# Patient Record
Sex: Male | Born: 1960
Health system: Southern US, Community
[De-identification: ages and names within clinical notes are randomized; demographics above are authoritative.]

## PROBLEM LIST (undated history)

## (undated) DIAGNOSIS — M109 Gout, unspecified: Secondary | ICD-10-CM

## (undated) DIAGNOSIS — I10 Essential (primary) hypertension: Secondary | ICD-10-CM

## (undated) DIAGNOSIS — Z86010 Personal history of colonic polyps: Secondary | ICD-10-CM

## (undated) DIAGNOSIS — E785 Hyperlipidemia, unspecified: Secondary | ICD-10-CM

## (undated) DIAGNOSIS — T7840XA Allergy, unspecified, initial encounter: Secondary | ICD-10-CM

## (undated) DIAGNOSIS — M1990 Unspecified osteoarthritis, unspecified site: Secondary | ICD-10-CM

## (undated) HISTORY — PX: POLYPECTOMY: SHX149

## (undated) HISTORY — DX: Unspecified osteoarthritis, unspecified site: M19.90

## (undated) HISTORY — DX: Allergy, unspecified, initial encounter: T78.40XA

## (undated) HISTORY — PX: CARPAL TUNNEL RELEASE: SHX101

## (undated) HISTORY — DX: Essential (primary) hypertension: I10

## (undated) HISTORY — DX: Gout, unspecified: M10.9

## (undated) HISTORY — DX: Personal history of colonic polyps: Z86.010

## (undated) HISTORY — DX: Hyperlipidemia, unspecified: E78.5

---
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
## 1986-06-16 HISTORY — PX: APPENDECTOMY: SHX54

## 1994-06-16 HISTORY — PX: EXPLORATORY LAPAROTOMY: SUR591

## 1995-06-17 HISTORY — PX: INCISIONAL HERNIA REPAIR: SHX193

## 1998-11-26 ENCOUNTER — Ambulatory Visit (HOSPITAL_COMMUNITY): Admission: RE | Admit: 1998-11-26 | Discharge: 1998-11-26 | Payer: Self-pay | Admitting: *Deleted

## 1998-11-26 ENCOUNTER — Encounter: Payer: Self-pay | Admitting: Family Medicine

## 2001-06-28 ENCOUNTER — Emergency Department (HOSPITAL_COMMUNITY): Admission: EM | Admit: 2001-06-28 | Discharge: 2001-06-28 | Payer: Self-pay | Admitting: Emergency Medicine

## 2001-06-28 ENCOUNTER — Encounter: Payer: Self-pay | Admitting: *Deleted

## 2001-09-09 ENCOUNTER — Ambulatory Visit (HOSPITAL_COMMUNITY): Admission: RE | Admit: 2001-09-09 | Discharge: 2001-09-09 | Payer: Self-pay | Admitting: Orthopaedic Surgery

## 2003-04-12 ENCOUNTER — Observation Stay (HOSPITAL_COMMUNITY): Admission: EM | Admit: 2003-04-12 | Discharge: 2003-04-13 | Payer: Self-pay | Admitting: Emergency Medicine

## 2003-04-18 ENCOUNTER — Ambulatory Visit (HOSPITAL_COMMUNITY): Admission: RE | Admit: 2003-04-18 | Discharge: 2003-04-18 | Payer: Self-pay | Admitting: Cardiology

## 2004-02-12 ENCOUNTER — Encounter: Admission: RE | Admit: 2004-02-12 | Discharge: 2004-03-22 | Payer: Self-pay | Admitting: Orthopaedic Surgery

## 2004-05-01 ENCOUNTER — Ambulatory Visit (HOSPITAL_COMMUNITY): Admission: RE | Admit: 2004-05-01 | Discharge: 2004-05-01 | Payer: Self-pay | Admitting: Orthopaedic Surgery

## 2004-05-22 ENCOUNTER — Encounter: Admission: RE | Admit: 2004-05-22 | Discharge: 2004-08-20 | Payer: Self-pay | Admitting: Orthopaedic Surgery

## 2004-06-16 HISTORY — PX: ELBOW SURGERY: SHX618

## 2008-02-10 ENCOUNTER — Ambulatory Visit (HOSPITAL_COMMUNITY): Admission: RE | Admit: 2008-02-10 | Discharge: 2008-02-10 | Payer: Self-pay | Admitting: Orthopaedic Surgery

## 2008-02-15 ENCOUNTER — Encounter: Admission: RE | Admit: 2008-02-15 | Discharge: 2008-03-28 | Payer: Self-pay | Admitting: Orthopaedic Surgery

## 2009-03-27 ENCOUNTER — Encounter: Admission: RE | Admit: 2009-03-27 | Discharge: 2009-06-14 | Payer: Self-pay | Admitting: Orthopaedic Surgery

## 2010-11-01 NOTE — H&P (Signed)
NAME:  Andre Hunt, Andre Hunt               ACCOUNT NO.:  [id number]   MEDICAL RECORD NO.:  [id number]          PATIENT TYPE:  AMB   LOCATION:  DAY                           FACILITY:  [facility]   PHYSICIAN:  J. Darreld Mclean, M.D. DATE OF BIRTH:  1960/11/17   DATE OF ADMISSION:  DATE OF DISCHARGE:  LH                                HISTORY & PHYSICAL   CHIEF COMPLAINT:  Elbow pain, carpal tunnel.   The patient is a 50 year old male with pain and tenderness in his right  elbow. 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 He hit his elbow on the right on an EMS call removing an  oxygen bottle on first response call. He was seen at [facility], given injection, and tennis elbow band. Continued to have  pain, continued to have tenderness. He had been on Celebrex. Also problems  with carpal tunnel syndrome in his right hand. I saw him in the office for  the first time on August 25 in relation to these injuries. I felt he had  significant lateral epicondylitis of the elbow. I gave him ice, massage,  Medrol dose pack, Vicodin ES, and tennis elbow band. Began in physical  therapy in [place]. He returned to the office on several occasions,  September 1, September 22, September 28, October 12. Continued to have pain  and tenderness. I gave him an injection on September 28, Depo-Medrol 40 over  the epicondyle area which only helped slightly. He obtained an EMG which  showed mild carpal tunnel left which was improved from his previous surgery  and moderate carpal tunnel on the right but no ulnar nerve changes. The  patient continued to have significant pain and tenderness. I asked that he  be seen by Dr. Metro Kung, a hand surgeon in [place] at [facility]. Dr. Metro Kung saw him on October 25, was concerned about the  findings, obtained a MRI. I asked  that she consider whether she thought he  needed surgery or not. He had his MRI done, and that showed significant  inflammation and extensive partial tearing of the common extensor origin of  the lateral epicondylitis area. She recommended surgery. The patient came  back and saw me and would like to have his surgery done here in [facility] rather than in [place]. I have talked to Dr. Metro Kung. She  understands, and she is agreeable to this. The patient will also release of  the carpal tunnel on the right as well as the correction of the lateral  epicondylitis. Risks and imponderables of the surgery have been discussed.  He understands he can still have some tenderness to his arm and still have  some slight weakness. He has carpal procedure on the left done by me several  years ago in 2003.   PAST HISTORY:  Negative. He has history of hay fever.   MEDICATIONS:  He is currently taking Ambien, Vicodin ES, Naprelan 375.  ALLERGIES:  He cannot tolerate CODEINE, and DARVOCET has caused him nausea  in the past.   SOCIAL HISTORY:  He does not smoke. He does not drink. Dr. Langston Masker is his  family doctor. The patient is married, lives in [place]. 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   PAST SURGICAL HISTORY:  Other than the hand surgery, he had another injury  in 1994 and had surgery and was taken care of.   FAMILY HISTORY:  Diabetes runs in his family.   PHYSICAL EXAMINATION:  VITAL SIGNS:  Within normal limits.  HEENT:  Negative.  NECK:  Supple.  LUNGS:  Clear to P&A.  HEART:  Regular with no murmur heard.  ABDOMEN:  Soft, nontender, with no masses.  EXTREMITIES:  Marked pain and tenderness of the right lateral epicondyle,  decreased sensation of the right median nerve, grip strength slightly  decreased on the right. Left hand has good strength, well healed scar from  carpal tunnel surgery. Full range of motion other extremities  negative.  CENTRAL NERVOUS SYSTEM:  Intact except as noted.  SKIN:  Intact.   IMPRESSION:  Significant and severe lateral epicondylitis, right elbow.  Carpal tunnel syndrome, right.   PLAN:  Release right carpal tunnel, volar carpal ligament. Surgery for the  right elbow with lateral epicondylitis.   Discussed with the patient the planned procedure, risks, and imponderables.  He appears to understands. Labs are pending.      JWK/MEDQ  D:  04/30/2004  T:  04/30/2004  Job:  [id number]

## 2010-11-01 NOTE — Group Therapy Note (Signed)
   NAME:  Andre Hunt, Andre Hunt NO.:  [id number]   MEDICAL RECORD NO.:  [id number]                   PATIENT TYPE:  OUT   LOCATION:  RAD                                  FACILITY:  [facility]   PHYSICIAN:  [place] Bing, M.D.               DATE OF BIRTH:  1961-02-19   DATE OF PROCEDURE:  DATE OF DISCHARGE:                                   PROGRESS NOTE   INDICATIONS:  [name] is a 50 year old male with no known history of  coronary artery disease, with cardiac risk factors notable for history of  dyslipidemia and tobacco chewing.  He was recently admitted to [facility] for evaluation of chest pain and dizziness.  Serial cardiac markers  were negative, and no dysrhythmias documented.   TEST DATA:  The patient exercised a total of 10 minutes and 11 seconds as  standard Bruce Protocol achieving 87% of PM HR with a maximal heart rate of  155.  METS 12.5.   Blood pressure rose from 118/78 baseline to 182/88 maximum.   Serial EKG tracings revealed no significant ST abnormalities.  Rare PVC's  noted during recovery, with no nonsustained ventricular tachycardia.   The patient reported no chest pain or dizziness during stress testing.   Tests discontinued secondary to fatigue.   CONCLUSION:  Negative adequate routine treadmill test;  Cardiolite images  pending.     ________________________________________  ___________________________________________  Rozell Searing, P.A. [facility]                       [place] Bing, M.D.   GS/MEDQ  D:  04/18/2003  T:  04/18/2003  Job:  [id number]   cc:   Gaynelle Cage, MD  [phone] [address]  [place]  [place] [address]  Fax: [phone]

## 2010-11-01 NOTE — Op Note (Signed)
NAME:  Andre Hunt, Andre Hunt               ACCOUNT NO.:  [id number]   MEDICAL RECORD NO.:  [id number]          PATIENT TYPE:  AMB   LOCATION:  DAY                           FACILITY:  [facility]   PHYSICIAN:  J. Darreld Mclean, M.D. DATE OF BIRTH:  [birth year]   DATE OF PROCEDURE:  05/01/2004  DATE OF DISCHARGE:                                 OPERATIVE REPORT   PREOPERATIVE DIAGNOSES:  1.  Lateral epicondylitis, right elbow.  2.  Carpal tunnel, right wrist.   POSTOPERATIVE DIAGNOSES:  1.  Lateral epicondylitis, right elbow.  2.  Carpal tunnel, right wrist.   PROCEDURE:  1.  Release of volar carpal ligament, right volar wrist, epineurolysis,      epineurotomy.  2.  Modified Boyd release, lateral epicondyle, lateral epicondylitis, right      elbow.   ANESTHESIA:  Bier block.   TOTAL TOURNIQUET TIME:  49 minutes.   No drains.  Volar plaster splint.   SURGEON:  J. Darreld Mclean, M.D.   ASSISTANT:  Lolita Cram, P.A.-C.   INDICATIONS:  The patient has a Workers Compensation injury to his right  elbow while working for the [employer].  His pain  has continued, has not gotten improved with conservative treatment.  He had  a second opinion with Dr. Metro Kung, hand surgeon in [place], who felt  surgery was indicated on the right elbow because of continued pain,  tenderness, and marked synovitis and reaction he was having.  He also has  carpal tunnel syndrome.  It was felt that it was best to get this taken care  of at the same time as his symptoms have gotten significantly worse since  his elbow has gotten injured.  The patient understands the risks and  imponderables of the procedure.  These were discussed preoperatively.  He  asked appropriate questions.   DESCRIPTION OF PROCEDURE:  The patient was seen in the holding area.  The  two sites on the right arm, the right elbow and the right wrist, were  identified, marked by the patient, also by me individually.   He was taken to  the operating room.  He was given Bier block anesthesia, laid supine on the  operating room table, a hand table attached.  The patient was then prepped  and draped in the usual manner.  We had a time-out, where we again  identified the patient and the right arm was the correct site and there were  two separate sites.  The carpal tunnel was done first.  An incision was made  proximally.  The median nerve was identified proximally, a vessel loop  placed around the nerve.  The volar carpal ligament was then incised after a  grooved director placed within the carpal tunnel space.  Specimen of carpal  tunnel sent to pathology.  The nerve was compressed obviously.  A saline  neurolysis epineurotomy carried out, retinaculum cut dorsally.  The wound  was reapproximated using 3-0 nylon, interrupted vertical mattress manner.  Attention was then directed to the elbow and then a curvilinear incision was  made over the lateral  epicondyle area.  The common tendon was identified.  I  looked to do a modified Boyd release.  Revealed the common extensor with  careful dissection, sharp dissection, and this was removed off of the  lateral epicondyle area, brought distal.  The radial head was identified,  the capsule was identified.  Using a #12 blade, an incision was made  approximately 2 mm above the end of the radial head and 2 mm just below it,  so it was a 4 mm swath that was removed.  The patient had significant  synovitis within the elbow, and this was removed.  There was a lot more  synovitis than I would have anticipated.  The lateral epicondyle was then  excised, a portion of it, using a sharp osteotome, and then a rongeur was  used and a curette to clean out this area.  The common tendon was then  brought forward approximately 0.5 cm proximal to its original placement and  sutured in with permanent suture of 0 Surgilon suture.  Other tissues were  reapproximated using __________  chromic suture and also 2-0 chromic.  A good  repair was obtained.  Subcutaneous tissues were approximated with 2-0 plain  and skin staples.  The tourniquet deflated at 49 minutes, sterile dressings  applied, bulky dressings applied, the posterior splint applied.  The patient  tolerated the procedure well, will go to recovery in good condition as an  outpatient.  Discharge medications have been given.  If any difficulties,  contact me through the office or the hospital beeper system.      JWK/MEDQ  D:  05/01/2004  T:  05/01/2004  Job:  [id number]

## 2010-11-01 NOTE — Discharge Summary (Signed)
NAME:  Andre Hunt, Andre Hunt NO.:  [id number]   MEDICAL RECORD NO.:  [id number]                   PATIENT TYPE:  INP   LOCATION:  3708                                 FACILITY:  [facility]   PHYSICIAN:  Vida Roller, M.D.                DATE OF BIRTH:  07-23-1960   DATE OF ADMISSION:  04/12/2003  DATE OF DISCHARGE:  04/13/2003                           DISCHARGE SUMMARY - REFERRING   DISCHARGE DIAGNOSES:  1. Episode of dizziness and chest pain Monday, October 25.  No prior history     of coronary artery disease.  Also had chest pain about one month ago.  2. Also has history of syncope last summer with extended exertion in hot     weather.   SECONDARY DIAGNOSES:  1. Dyslipidemia.  2. Continued tobacco habituation using snuff.  3. Obesity.  4. Status post appendectomy.  5. The patient has hiatal hernia.   PROCEDURE:  No procedures this hospitalization, only laboratory studies,  cardiac enzymes, CBC, CMET, and TSH.   DISCHARGE DISPOSITION:  On the morning of October 28 the patient is not  having any further chest pain or dizziness.  He has been in sinus rhythm  this hospitalization without arrhythmias.  His potassium is low at 3.3 and  it will be replenished today.  Cardiac enzymes so far have been negative x2.  A third set is pending, be drawn at about 9:30.  This will be available in  the computer by the time this discharge is being read.  The patient will be  scheduled for outpatient exercise stress study at the [facility].  He will also have a follow-up with Vida Roller, M.D.  after the stress study has been performed and he will have a follow-up BMET  to assess potassium level at the time of his visit with Vida Roller, M.D.  Both the Cardiolite study and the office visit are being arranged and are  pending at the time of this dictation.   BRIEF HISTORY:  [name] is a 50 year old male with no history of  coronary artery disease.  He had an episode of chest pain which was located  mid sternally about a month ago.  He also complains of dizzy spells.  On  October 27 he was moving some heavy bags and had dizziness today.  Also had  chest pain with exertion on Monday that was transient.  He saw his primary  caregiver, Dr. Reola Calkins, October 27 and was sent to the emergency room.  He did  have an episode of syncope x1 in the summer with extended exertion in hot  weather.  Otherwise, the patient just has dizzy spells, not clearly  orthostatic situation.  Cholesterol levels were taken March 22, 2003.  LDL  was 136, HDL 41, triglycerides 285, total cholesterol 234.  The patient will  be admitted.  He will be placed on  cardiac monitoring.  Cardiac enzymes  serially x3 will be obtained and most probably he will have outpatient  Cardiolite.  He will go home on Statin therapy.   HOSPITAL COURSE:  After admission to the emergency room on October 27  patient has had no further chest pain or dizziness.  He has done well.  He  has had no cardiac dysrhythmias.  Cardiac enzymes, as mentioned above, were  negative.  He has been prescribed Statin therapy.   DISCHARGE MEDICATIONS:  1. Enteric-coated aspirin 325 mg daily.  2. Lipitor 10 mg each morning.  3. Nitroglycerin 0.4 mg one tablet under the tongue every 5 minutes x3 doses     as needed for chest pain.   DIET:  Increase fruits and vegetables to increase his potassium.  Also, low  sodium, low cholesterol diet.   FOLLOWUP:  Outpatient Cardiolite stress test at [facility] office  and also follow up with Vida Roller, M.D. after the stress study is done  with a BMET.   LABORATORIES:  So far enzymes October 27 at 1802 hours myoglobin 99.4, CK-MB  less than 1.0, troponin I less than 0.05.  Enzymes October 28 at 1:45 a.m.  CK 223, CK-MB 1.0, troponin I less than 0.01.  Third set pending.  The CMET  on admission:  Sodium 139, potassium 3.3, chloride  105, carbonate 29, BUN  13, creatinine 1, glucose 100.  Alkaline phosphatase 56, SGOT 51, SGPT 30.  TSH 1.131.  PT 12.8, INR 0.9, PTT 27.  Cholesterol studies taken October 6  already dictated above.      Maple Mirza, P.A.                    Vida Roller, M.D.    GM/MEDQ  D:  04/13/2003  T:  04/13/2003  Job:  [id number]   cc:   Gaynelle Cage, MD  [phone] [address]  [place]  [place] [address]  Fax: [phone]

## 2011-07-16 DIAGNOSIS — M7710 Lateral epicondylitis, unspecified elbow: Secondary | ICD-10-CM | POA: Diagnosis not present

## 2011-07-16 DIAGNOSIS — M1A00X0 Idiopathic chronic gout, unspecified site, without tophus (tophi): Secondary | ICD-10-CM | POA: Diagnosis not present

## 2011-09-03 DIAGNOSIS — Z125 Encounter for screening for malignant neoplasm of prostate: Secondary | ICD-10-CM | POA: Diagnosis not present

## 2011-09-03 DIAGNOSIS — E785 Hyperlipidemia, unspecified: Secondary | ICD-10-CM | POA: Diagnosis not present

## 2011-09-03 DIAGNOSIS — K219 Gastro-esophageal reflux disease without esophagitis: Secondary | ICD-10-CM | POA: Diagnosis not present

## 2011-09-03 DIAGNOSIS — M109 Gout, unspecified: Secondary | ICD-10-CM | POA: Diagnosis not present

## 2011-09-17 DIAGNOSIS — M1A00X0 Idiopathic chronic gout, unspecified site, without tophus (tophi): Secondary | ICD-10-CM | POA: Diagnosis not present

## 2011-09-17 DIAGNOSIS — M7710 Lateral epicondylitis, unspecified elbow: Secondary | ICD-10-CM | POA: Diagnosis not present

## 2011-11-13 DIAGNOSIS — J209 Acute bronchitis, unspecified: Secondary | ICD-10-CM | POA: Diagnosis not present

## 2011-11-19 DIAGNOSIS — M1A00X0 Idiopathic chronic gout, unspecified site, without tophus (tophi): Secondary | ICD-10-CM | POA: Diagnosis not present

## 2011-11-19 DIAGNOSIS — M7710 Lateral epicondylitis, unspecified elbow: Secondary | ICD-10-CM | POA: Diagnosis not present

## 2012-01-19 DIAGNOSIS — M1A00X0 Idiopathic chronic gout, unspecified site, without tophus (tophi): Secondary | ICD-10-CM | POA: Diagnosis not present

## 2012-01-19 DIAGNOSIS — M7710 Lateral epicondylitis, unspecified elbow: Secondary | ICD-10-CM | POA: Diagnosis not present

## 2012-03-22 DIAGNOSIS — M7710 Lateral epicondylitis, unspecified elbow: Secondary | ICD-10-CM | POA: Diagnosis not present

## 2012-04-05 DIAGNOSIS — E785 Hyperlipidemia, unspecified: Secondary | ICD-10-CM | POA: Diagnosis not present

## 2012-04-05 DIAGNOSIS — M109 Gout, unspecified: Secondary | ICD-10-CM | POA: Diagnosis not present

## 2012-04-05 DIAGNOSIS — I10 Essential (primary) hypertension: Secondary | ICD-10-CM | POA: Diagnosis not present

## 2012-04-05 DIAGNOSIS — K219 Gastro-esophageal reflux disease without esophagitis: Secondary | ICD-10-CM | POA: Diagnosis not present

## 2012-05-20 DIAGNOSIS — M7710 Lateral epicondylitis, unspecified elbow: Secondary | ICD-10-CM | POA: Diagnosis not present

## 2012-07-22 DIAGNOSIS — M7710 Lateral epicondylitis, unspecified elbow: Secondary | ICD-10-CM | POA: Diagnosis not present

## 2012-08-05 DIAGNOSIS — M109 Gout, unspecified: Secondary | ICD-10-CM | POA: Diagnosis not present

## 2012-08-05 DIAGNOSIS — E785 Hyperlipidemia, unspecified: Secondary | ICD-10-CM | POA: Diagnosis not present

## 2012-08-05 DIAGNOSIS — K219 Gastro-esophageal reflux disease without esophagitis: Secondary | ICD-10-CM | POA: Diagnosis not present

## 2012-08-05 DIAGNOSIS — R7989 Other specified abnormal findings of blood chemistry: Secondary | ICD-10-CM | POA: Diagnosis not present

## 2012-08-17 ENCOUNTER — Encounter: Payer: Self-pay | Admitting: Internal Medicine

## 2012-09-09 ENCOUNTER — Ambulatory Visit (AMBULATORY_SURGERY_CENTER): Payer: Medicare Other | Admitting: *Deleted

## 2012-09-09 ENCOUNTER — Telehealth: Payer: Self-pay | Admitting: *Deleted

## 2012-09-09 VITALS — Ht 68.0 in | Wt 235.6 lb

## 2012-09-09 DIAGNOSIS — K625 Hemorrhage of anus and rectum: Secondary | ICD-10-CM

## 2012-09-09 MED ORDER — NA SULFATE-K SULFATE-MG SULF 17.5-3.13-1.6 GM/177ML PO SOLN: ORAL | Status: DC

## 2012-09-09 NOTE — Telephone Encounter (Signed)
Dr Leone Payor: pt here for PV today for screening colonoscopy scheduled for 09/23/12.  While in PV; pt says that he is having intermittent bright red rectal bleeding with BM for 1 year.  Each episode lasts about 2 weeks.  Last episode was 1 week ago.  Denies having hemorrhoids; does complain of rectal itching and burning.  Is he ok for direct colon or does he need OV? Thanks, Olegario Messier

## 2012-09-10 NOTE — Telephone Encounter (Signed)
Direct is fine 

## 2012-09-10 NOTE — Telephone Encounter (Signed)
Pt notified to keep colonoscopy appointment as scheduled

## 2012-09-23 ENCOUNTER — Ambulatory Visit (AMBULATORY_SURGERY_CENTER): Payer: Medicare Other | Admitting: Internal Medicine

## 2012-09-23 ENCOUNTER — Encounter: Payer: Self-pay | Admitting: Internal Medicine

## 2012-09-23 VITALS — BP 90/57 | HR 55 | Temp 98.0°F | Resp 16 | Ht 68.0 in | Wt 235.0 lb

## 2012-09-23 DIAGNOSIS — E785 Hyperlipidemia, unspecified: Secondary | ICD-10-CM | POA: Diagnosis not present

## 2012-09-23 DIAGNOSIS — K625 Hemorrhage of anus and rectum: Secondary | ICD-10-CM

## 2012-09-23 DIAGNOSIS — K644 Residual hemorrhoidal skin tags: Secondary | ICD-10-CM

## 2012-09-23 DIAGNOSIS — D126 Benign neoplasm of colon, unspecified: Secondary | ICD-10-CM

## 2012-09-23 HISTORY — PX: COLONOSCOPY: SHX174

## 2012-09-23 MED ORDER — SODIUM CHLORIDE 0.9 % IV SOLN: 500.0000 mL | INTRAVENOUS | Status: DC

## 2012-09-23 NOTE — Progress Notes (Signed)
Called to room to assist during endoscopic procedure.  Patient ID and intended procedure confirmed with present staff. Received instructions for my participation in the procedure from the performing physician. ewm 

## 2012-09-23 NOTE — Progress Notes (Signed)
Patient got irritated when I had to go over all of his medicines. I tried to ex[plain that we had to know when he took all of them.  Patient also frowning at nurse who is trying to put in his IV.

## 2012-09-23 NOTE — Patient Instructions (Addendum)
I found a medium-sized polyp and removed it. I suspect the bleeding was from the anal or rectal area, you have very small hemorrhoids.  I will let you know pathology results and when to have another routine colonoscopy by mail.  Thank you for choosing me and [facility].  Iva Boop, MD, [facility]   AVOID ASPIRIN PRODUCTS AND ANTIINFLAMMATORIES(ALEVE,IBUPROFEN,ADVIL) FOR ONE WEEK UNTIL April 17,2014.  YOU HAD AN ENDOSCOPIC PROCEDURE TODAY AT THE [facility]: Refer to the procedure report that was given to you for any specific questions about what was found during the examination.  If the procedure report does not answer your questions, please call your gastroenterologist to clarify.  If you requested that your care partner not be given the details of your procedure findings, then the procedure report has been included in a sealed envelope for you to review at your convenience later.  YOU SHOULD EXPECT: Some feelings of bloating in the abdomen. Passage of more gas than usual.  Walking can help get rid of the air that was put into your GI tract during the procedure and reduce the bloating. If you had a lower endoscopy (such as a colonoscopy or flexible sigmoidoscopy) you may notice spotting of blood in your stool or on the toilet paper. If you underwent a bowel prep for your procedure, then you may not have a normal bowel movement for a few days.  DIET: Your first meal following the procedure should be a light meal and then it is ok to progress to your normal diet.  A half-sandwich or bowl of soup is an example of a good first meal.  Heavy or fried foods are harder to digest and may make you feel nauseous or bloated.  Likewise meals heavy in dairy and vegetables can cause extra gas to form and this can also increase the bloating.  Drink plenty of fluids but you should avoid alcoholic beverages for 24 hours.  ACTIVITY: Your care partner should take you home directly after the  procedure.  You should plan to take it easy, moving slowly for the rest of the day.  You can resume normal activity the day after the procedure however you should NOT DRIVE or use heavy machinery for 24 hours (because of the sedation medicines used during the test).    SYMPTOMS TO REPORT IMMEDIATELY: A gastroenterologist can be reached at any hour.  During normal business hours, 8:30 AM to 5:00 PM Monday through Friday, call [phone].  After hours and on weekends, please call the GI answering service at [phone] who will take a message and have the physician on call contact you.   Following lower endoscopy (colonoscopy or flexible sigmoidoscopy):  Excessive amounts of blood in the stool  Significant tenderness or worsening of abdominal pains  Swelling of the abdomen that is new, acute  Fever of 100F or higher  FOLLOW UP: If any biopsies were taken you will be contacted by phone or by letter within the next 1-3 weeks.  Call your gastroenterologist if you have not heard about the biopsies in 3 weeks.  Our staff will call the home number listed on your records the next business day following your procedure to check on you and address any questions or concerns that you may have at that time regarding the information given to you following your procedure. This is a courtesy call and so if there is no answer at the home number and we have not heard from you  through the emergency physician on call, we will assume that you have returned to your regular daily activities without incident.  SIGNATURES/CONFIDENTIALITY: You and/or your care partner have signed paperwork which will be entered into your electronic medical record.  These signatures attest to the fact that that the information above on your After Visit Summary has been reviewed and is understood.  Full responsibility of the confidentiality of this discharge information lies with you and/or your care-partner.

## 2012-09-23 NOTE — Op Note (Signed)
 [facility] [address] [place], [address]   COLONOSCOPY PROCEDURE REPORT  PATIENT: Andre Hunt, Andre Hunt  MR#: [id number] BIRTHDATE: 04/06/1961 , 52  yrs. old GENDER: Male ENDOSCOPIST: Iva Boop, MD, [facility] REFERRED NW:GNFA Sheron Nightingale, N.P. PROCEDURE DATE:  09/23/2012 PROCEDURE:   Colonoscopy with snare polypectomy ASA CLASS:   Class II INDICATIONS:Rectal Bleeding. MEDICATIONS: propofol (Diprivan) 300mg  IV, MAC sedation, administered by CRNA, and These medications were titrated to patient response per physician's verbal order  DESCRIPTION OF PROCEDURE:   After the risks benefits and alternatives of the procedure were thoroughly explained, informed consent was obtained.  A digital rectal exam revealed no abnormalities of the rectum and A digital rectal exam revealed the prostate was not enlarged.   The LB CF-[id number]  endoscope was introduced through the anus and advanced to the cecum, which was identified by both the appendix and ileocecal valve. No adverse events experienced.   The quality of the prep was Suprep excellent The instrument was then slowly withdrawn as the colon was fully examined.      COLON FINDINGS: A polypoid shaped semi-pedunculated polyp measuring 1 cm in size was found in the sigmoid colon.  A polypectomy was performed using snare cautery.  The resection was complete and the polyp tissue was completely retrieved.   Small external hemorrhoids were found.   The colon mucosa was otherwise normal.  Retroflexed views revealed external hemorrhoids. The time to cecum=1 minutes 31 seconds.  Withdrawal time=8 minutes 20 seconds.  The scope was withdrawn and the procedure completed. COMPLICATIONS: There were no complications.  ENDOSCOPIC IMPRESSION: 1.   Semi-pedunculated polyp measuring 1 cm in size was found in the sigmoid colon; polypectomy was performed using snare cautery 2.   Small external hemorrhoids 3.   The colon mucosa was  otherwise normal - excellent prep  RECOMMENDATIONS: 1.  Timing of repeat colonoscopy will be determined by pathology findings. 2.  Hold aspirin, aspirin products, and anti-inflammatory medication for 1 week.   eSigned:  Iva Boop, MD, [facility] 09/23/2012 10:18 AM   cc: The Patient and Bennie Pierini, NP

## 2012-09-23 NOTE — Progress Notes (Signed)
Patient did not experience any of the following events: a burn prior to discharge; a fall within the facility; wrong site/side/patient/procedure/implant event; or a hospital transfer or hospital admission upon discharge from the facility. (G8907) Patient did not have preoperative order for IV antibiotic SSI prophylaxis. (G8918)  

## 2012-09-24 ENCOUNTER — Telehealth: Payer: Self-pay | Admitting: *Deleted

## 2012-09-24 NOTE — Telephone Encounter (Signed)
  Follow up Call-  Call back number 09/23/2012  Post procedure Call Back phone  # [phone]  Permission to leave phone message Yes     Patient questions:  Do you have a fever, pain , or abdominal swelling? no Pain Score  0 *  Have you tolerated food without any problems? yes  Have you been able to return to your normal activities? yes  Do you have any questions about your discharge instructions: Diet   no Medications  no Follow up visit  no  Do you have questions or concerns about your Care? no  Actions: * If pain score is 4 or above: No action needed, pain <4.

## 2012-09-28 ENCOUNTER — Encounter: Payer: Self-pay | Admitting: Internal Medicine

## 2012-09-28 DIAGNOSIS — Z860100 Personal history of colon polyps, unspecified: Secondary | ICD-10-CM

## 2012-09-28 HISTORY — DX: Personal history of colon polyps, unspecified: Z86.0100

## 2012-09-28 HISTORY — DX: Personal history of colonic polyps: Z86.010

## 2012-09-28 NOTE — Progress Notes (Signed)
Quick Note:  1 cm tubular adenoma Repeat colonoscopy about 09/2015 ______

## 2012-09-29 DIAGNOSIS — M7710 Lateral epicondylitis, unspecified elbow: Secondary | ICD-10-CM | POA: Diagnosis not present

## 2012-10-12 ENCOUNTER — Encounter: Payer: Self-pay | Admitting: *Deleted

## 2012-10-12 NOTE — Telephone Encounter (Signed)
This encounter was created in error - please disregard.

## 2012-10-27 DIAGNOSIS — M25519 Pain in unspecified shoulder: Secondary | ICD-10-CM | POA: Diagnosis not present

## 2012-10-27 DIAGNOSIS — M7710 Lateral epicondylitis, unspecified elbow: Secondary | ICD-10-CM | POA: Diagnosis not present

## 2012-11-24 DIAGNOSIS — M25519 Pain in unspecified shoulder: Secondary | ICD-10-CM | POA: Diagnosis not present

## 2012-11-24 DIAGNOSIS — M7710 Lateral epicondylitis, unspecified elbow: Secondary | ICD-10-CM | POA: Diagnosis not present

## 2012-11-25 ENCOUNTER — Other Ambulatory Visit (HOSPITAL_COMMUNITY): Payer: Self-pay | Admitting: Orthopaedic Surgery

## 2012-11-25 DIAGNOSIS — M25511 Pain in right shoulder: Secondary | ICD-10-CM

## 2012-11-25 DIAGNOSIS — R52 Pain, unspecified: Secondary | ICD-10-CM

## 2012-11-30 ENCOUNTER — Ambulatory Visit (HOSPITAL_COMMUNITY)
Admission: RE | Admit: 2012-11-30 | Discharge: 2012-11-30 | Disposition: A | Discharge status: Home / Self Care | Payer: Medicare Other | Source: Ambulatory Visit | Attending: Orthopaedic Surgery | Admitting: Orthopaedic Surgery

## 2012-11-30 DIAGNOSIS — R209 Unspecified disturbances of skin sensation: Secondary | ICD-10-CM | POA: Insufficient documentation

## 2012-11-30 DIAGNOSIS — M25511 Pain in right shoulder: Secondary | ICD-10-CM

## 2012-11-30 DIAGNOSIS — M25519 Pain in unspecified shoulder: Secondary | ICD-10-CM | POA: Diagnosis not present

## 2012-11-30 DIAGNOSIS — M719 Bursopathy, unspecified: Secondary | ICD-10-CM | POA: Diagnosis not present

## 2012-12-02 DIAGNOSIS — M25519 Pain in unspecified shoulder: Secondary | ICD-10-CM | POA: Diagnosis not present

## 2013-01-13 DIAGNOSIS — M25519 Pain in unspecified shoulder: Secondary | ICD-10-CM | POA: Diagnosis not present

## 2013-02-25 ENCOUNTER — Other Ambulatory Visit: Payer: Self-pay | Admitting: Nurse Practitioner

## 2013-02-28 NOTE — Telephone Encounter (Signed)
Last seen 08/05/12  MMM 

## 2013-03-15 DIAGNOSIS — M25519 Pain in unspecified shoulder: Secondary | ICD-10-CM | POA: Diagnosis not present

## 2013-04-07 ENCOUNTER — Other Ambulatory Visit: Payer: Self-pay | Admitting: Nurse Practitioner

## 2013-04-11 NOTE — Telephone Encounter (Signed)
Last seen 07/2012

## 2013-05-10 DIAGNOSIS — M7710 Lateral epicondylitis, unspecified elbow: Secondary | ICD-10-CM | POA: Diagnosis not present

## 2013-05-10 DIAGNOSIS — M25519 Pain in unspecified shoulder: Secondary | ICD-10-CM | POA: Diagnosis not present

## 2013-07-11 DIAGNOSIS — M25519 Pain in unspecified shoulder: Secondary | ICD-10-CM | POA: Diagnosis not present

## 2013-08-09 DIAGNOSIS — Z79899 Other long term (current) drug therapy: Secondary | ICD-10-CM | POA: Diagnosis not present

## 2013-08-09 DIAGNOSIS — M25519 Pain in unspecified shoulder: Secondary | ICD-10-CM | POA: Diagnosis not present

## 2013-10-26 DIAGNOSIS — M25519 Pain in unspecified shoulder: Secondary | ICD-10-CM | POA: Diagnosis not present

## 2013-10-26 DIAGNOSIS — M25539 Pain in unspecified wrist: Secondary | ICD-10-CM | POA: Diagnosis not present

## 2013-10-26 DIAGNOSIS — Z79899 Other long term (current) drug therapy: Secondary | ICD-10-CM | POA: Diagnosis not present

## 2013-11-25 DIAGNOSIS — M25539 Pain in unspecified wrist: Secondary | ICD-10-CM | POA: Diagnosis not present

## 2013-11-25 DIAGNOSIS — M25519 Pain in unspecified shoulder: Secondary | ICD-10-CM | POA: Diagnosis not present

## 2014-01-12 DIAGNOSIS — I10 Essential (primary) hypertension: Secondary | ICD-10-CM | POA: Diagnosis not present

## 2014-01-12 DIAGNOSIS — M25519 Pain in unspecified shoulder: Secondary | ICD-10-CM | POA: Diagnosis not present

## 2014-02-28 DIAGNOSIS — M7710 Lateral epicondylitis, unspecified elbow: Secondary | ICD-10-CM | POA: Diagnosis not present

## 2014-02-28 DIAGNOSIS — I10 Essential (primary) hypertension: Secondary | ICD-10-CM | POA: Diagnosis not present

## 2014-02-28 DIAGNOSIS — M25519 Pain in unspecified shoulder: Secondary | ICD-10-CM | POA: Diagnosis not present

## 2014-03-24 ENCOUNTER — Encounter: Payer: Self-pay | Admitting: Nurse Practitioner

## 2014-03-24 ENCOUNTER — Ambulatory Visit (INDEPENDENT_AMBULATORY_CARE_PROVIDER_SITE_OTHER): Payer: Medicare Other | Admitting: Nurse Practitioner

## 2014-03-24 ENCOUNTER — Encounter (INDEPENDENT_AMBULATORY_CARE_PROVIDER_SITE_OTHER): Payer: Self-pay

## 2014-03-24 VITALS — BP 127/84 | HR 65 | Temp 97.7°F | Ht 68.0 in | Wt 236.6 lb

## 2014-03-24 DIAGNOSIS — E785 Hyperlipidemia, unspecified: Secondary | ICD-10-CM | POA: Diagnosis not present

## 2014-03-24 DIAGNOSIS — Z125 Encounter for screening for malignant neoplasm of prostate: Secondary | ICD-10-CM | POA: Diagnosis not present

## 2014-03-24 NOTE — Patient Instructions (Signed)

## 2014-03-24 NOTE — Progress Notes (Signed)
   Subjective:    Patient ID: Andre Hunt, male    DOB: 08/01/1960, 53 y.o.   MRN: [id number]  HPI Patient here today to have follow up of hyperlipidemia- He has been on a diet the last 6 months and has lost 20lbs. He is not taking statin because they make him feel so bad. He feels great without complaints.    Review of Systems  Constitutional: Negative.   HENT: Negative.   Respiratory: Negative.   Cardiovascular: Negative.   Genitourinary: Negative.   Neurological: Negative.   Psychiatric/Behavioral: Negative.   All other systems reviewed and are negative.      Objective:   Physical Exam  Constitutional: He is oriented to person, place, and time. He appears well-developed and well-nourished.  HENT:  Head: Normocephalic.  Right Ear: External ear normal.  Left Ear: External ear normal.  Nose: Nose normal.  Mouth/Throat: Oropharynx is clear and moist.  Eyes: EOM are normal. Pupils are equal, round, and reactive to light.  Neck: Normal range of motion. Neck supple. No JVD present. No thyromegaly present.  Cardiovascular: Normal rate, regular rhythm, normal heart sounds and intact distal pulses.  Exam reveals no gallop and no friction rub.   No murmur heard. Pulmonary/Chest: Effort normal and breath sounds normal. No respiratory distress. He has no wheezes. He has no rales. He exhibits no tenderness.  Abdominal: Soft. Bowel sounds are normal. He exhibits no mass. There is no tenderness.  Musculoskeletal: Normal range of motion. He exhibits no edema.  Lymphadenopathy:    He has no cervical adenopathy.  Neurological: He is alert and oriented to person, place, and time. No cranial nerve deficit.  Skin: Skin is warm and dry.  Psychiatric: He has a normal mood and affect. His behavior is normal. Judgment and thought content normal.   BP 127/84  Pulse 65  Temp(Src) 97.7 F (36.5 C) (Oral)  Ht 5' 8" (1.727 m)  Wt 236 lb 9.6 oz (107.321 kg)  BMI 35.98 kg/m2          Assessment & Plan:   1. Hyperlipidemia with target LDL less than 100   2. Prostate cancer screening    Orders Placed This Encounter  Procedures  . CMP14+EGFR  . NMR, lipoprofile  . PSA, total and free   Low fat diet and exercise Health maintenance reviewed Follow up in 6 months  Mary-Margaret Martin, FNP  

## 2014-03-25 LAB — NMR, LIPOPROFILE
CHOLESTEROL: 245 mg/dL — AB (ref 100–199)
HDL Cholesterol by NMR: 34 mg/dL — ABNORMAL LOW (ref >39)
HDL PARTICLE NUMBER: 25.1 umol/L — AB (ref >=30.5)
LDL Particle Number: 2203 nmol/L — ABNORMAL HIGH (ref <1000)
LDL SIZE: 19.9 nm (ref >20.5)
LDLC SERPL CALC-MCNC: 174 mg/dL — ABNORMAL HIGH (ref 0–99)
LP-IR SCORE: 77 — AB (ref <=45)
Small LDL Particle Number: 1613 nmol/L — ABNORMAL HIGH (ref <=527)
TRIGLYCERIDES BY NMR: 183 mg/dL — AB (ref 0–149)

## 2014-03-25 LAB — CMP14+EGFR
A/G RATIO: 2.1 (ref 1.1–2.5)
ALBUMIN: 4.6 g/dL (ref 3.5–5.5)
ALK PHOS: 56 IU/L (ref 39–117)
ALT: 31 IU/L (ref 0–44)
AST: 24 IU/L (ref 0–40)
BILIRUBIN TOTAL: 0.5 mg/dL (ref 0.0–1.2)
BUN / CREAT RATIO: 18 (ref 9–20)
BUN: 20 mg/dL (ref 6–24)
CO2: 25 mmol/L (ref 18–29)
CREATININE: 1.1 mg/dL (ref 0.76–1.27)
Calcium: 9.6 mg/dL (ref 8.7–10.2)
Chloride: 103 mmol/L (ref 97–108)
GFR, EST AFRICAN AMERICAN: 89 mL/min/{1.73_m2} (ref >59)
GFR, EST NON AFRICAN AMERICAN: 77 mL/min/{1.73_m2} (ref >59)
GLOBULIN, TOTAL: 2.2 g/dL (ref 1.5–4.5)
Glucose: 85 mg/dL (ref 65–99)
Potassium: 4.6 mmol/L (ref 3.5–5.2)
Sodium: 142 mmol/L (ref 134–144)
TOTAL PROTEIN: 6.8 g/dL (ref 6.0–8.5)

## 2014-03-25 LAB — PSA, TOTAL AND FREE
PSA, Free Pct: 56.7 %
PSA, Free: 0.34 ng/mL
PSA: 0.6 ng/mL (ref 0.0–4.0)

## 2014-03-27 ENCOUNTER — Other Ambulatory Visit: Payer: Self-pay | Admitting: Nurse Practitioner

## 2014-03-27 MED ORDER — ATORVASTATIN CALCIUM 40 MG PO TABS: 40.0000 mg | ORAL_TABLET | Freq: Every day | ORAL | Status: DC

## 2014-04-11 DIAGNOSIS — M25511 Pain in right shoulder: Secondary | ICD-10-CM | POA: Diagnosis not present

## 2014-04-11 DIAGNOSIS — I10 Essential (primary) hypertension: Secondary | ICD-10-CM | POA: Diagnosis not present

## 2014-05-30 DIAGNOSIS — M25511 Pain in right shoulder: Secondary | ICD-10-CM | POA: Diagnosis not present

## 2014-05-30 DIAGNOSIS — I10 Essential (primary) hypertension: Secondary | ICD-10-CM | POA: Diagnosis not present

## 2014-07-13 ENCOUNTER — Other Ambulatory Visit (HOSPITAL_COMMUNITY): Payer: Self-pay | Admitting: Orthopaedic Surgery

## 2014-07-13 DIAGNOSIS — M25511 Pain in right shoulder: Secondary | ICD-10-CM | POA: Diagnosis not present

## 2014-07-13 DIAGNOSIS — I10 Essential (primary) hypertension: Secondary | ICD-10-CM | POA: Diagnosis not present

## 2014-07-13 DIAGNOSIS — M542 Cervicalgia: Secondary | ICD-10-CM

## 2014-07-18 ENCOUNTER — Ambulatory Visit (HOSPITAL_COMMUNITY)
Admission: RE | Admit: 2014-07-18 | Discharge: 2014-07-18 | Disposition: A | Discharge status: Home / Self Care | Payer: Medicare Other | Source: Ambulatory Visit | Attending: Orthopaedic Surgery | Admitting: Orthopaedic Surgery

## 2014-07-18 DIAGNOSIS — M542 Cervicalgia: Secondary | ICD-10-CM

## 2014-07-18 DIAGNOSIS — M5030 Other cervical disc degeneration, unspecified cervical region: Secondary | ICD-10-CM | POA: Diagnosis not present

## 2014-07-18 DIAGNOSIS — R200 Anesthesia of skin: Secondary | ICD-10-CM | POA: Insufficient documentation

## 2014-07-18 DIAGNOSIS — M4802 Spinal stenosis, cervical region: Secondary | ICD-10-CM | POA: Diagnosis not present

## 2014-07-18 DIAGNOSIS — M5022 Other cervical disc displacement, mid-cervical region: Secondary | ICD-10-CM | POA: Diagnosis not present

## 2014-07-18 DIAGNOSIS — M25511 Pain in right shoulder: Secondary | ICD-10-CM | POA: Diagnosis not present

## 2014-07-18 DIAGNOSIS — M5412 Radiculopathy, cervical region: Secondary | ICD-10-CM | POA: Insufficient documentation

## 2014-07-18 DIAGNOSIS — M5032 Other cervical disc degeneration, mid-cervical region: Secondary | ICD-10-CM | POA: Diagnosis not present

## 2014-07-20 DIAGNOSIS — M542 Cervicalgia: Secondary | ICD-10-CM | POA: Diagnosis not present

## 2014-07-20 DIAGNOSIS — I10 Essential (primary) hypertension: Secondary | ICD-10-CM | POA: Diagnosis not present

## 2014-08-04 ENCOUNTER — Other Ambulatory Visit: Payer: Self-pay | Admitting: Nurse Practitioner

## 2014-08-04 ENCOUNTER — Ambulatory Visit (INDEPENDENT_AMBULATORY_CARE_PROVIDER_SITE_OTHER): Payer: Medicare Other | Admitting: Nurse Practitioner

## 2014-08-04 ENCOUNTER — Encounter: Payer: Self-pay | Admitting: Nurse Practitioner

## 2014-08-04 VITALS — BP 148/90 | HR 65 | Temp 97.0°F | Ht 68.0 in | Wt 239.0 lb

## 2014-08-04 DIAGNOSIS — K625 Hemorrhage of anus and rectum: Secondary | ICD-10-CM | POA: Diagnosis not present

## 2014-08-04 DIAGNOSIS — I10 Essential (primary) hypertension: Secondary | ICD-10-CM | POA: Diagnosis not present

## 2014-08-04 DIAGNOSIS — E785 Hyperlipidemia, unspecified: Secondary | ICD-10-CM | POA: Diagnosis not present

## 2014-08-04 MED ORDER — LISINOPRIL 20 MG PO TABS: 20.0000 mg | ORAL_TABLET | Freq: Every day | ORAL | Status: DC

## 2014-08-04 MED ORDER — DIAZEPAM 5 MG PO TABS: 5.0000 mg | ORAL_TABLET | Freq: Two times a day (BID) | ORAL | Status: DC | PRN

## 2014-08-04 MED ORDER — ATORVASTATIN CALCIUM 40 MG PO TABS: 40.0000 mg | ORAL_TABLET | Freq: Every day | ORAL | Status: DC

## 2014-08-04 NOTE — Patient Instructions (Signed)

## 2014-08-04 NOTE — Progress Notes (Signed)
   Subjective:    Patient ID: Andre Hunt, male    DOB: 31-Oct-1960, 54 y.o.   MRN: [id number]   Patient here today for follow up of chronic medical problems.  -C/O rectal bleeding for over 2 years- occurs 2-3 x a week-no abdominal pain - blood pressure has been high for 3 months- he says that elevated since he stated crestor.  Hyperlipidemia This is a chronic problem. The current episode started more than 1 year ago. The problem is uncontrolled. Recent lipid tests were reviewed and are variable. Exacerbating diseases include obesity. He has no history of diabetes or hypothyroidism. Current antihyperlipidemic treatment includes statins. The current treatment provides moderate improvement of lipids. Compliance problems include adherence to diet and adherence to exercise.  Risk factors for coronary artery disease include dyslipidemia, male sex and obesity.      Review of Systems  Constitutional: Negative.   HENT: Negative.   Respiratory: Negative.   Cardiovascular: Negative.   Genitourinary: Negative.   Neurological: Negative.   Psychiatric/Behavioral: Negative.   All other systems reviewed and are negative.      Objective:   Physical Exam  Constitutional: He is oriented to person, place, and time. He appears well-developed and well-nourished.  HENT:  Head: Normocephalic.  Right Ear: External ear normal.  Left Ear: External ear normal.  Nose: Nose normal.  Mouth/Throat: Oropharynx is clear and moist.  Eyes: EOM are normal. Pupils are equal, round, and reactive to light.  Neck: Normal range of motion. Neck supple. No JVD present. No thyromegaly present.  Cardiovascular: Normal rate, regular rhythm, normal heart sounds and intact distal pulses.  Exam reveals no gallop and no friction rub.   No murmur heard. Pulmonary/Chest: Effort normal and breath sounds normal. No respiratory distress. He has no wheezes. He has no rales. He exhibits no tenderness.  Abdominal: Soft. Bowel  sounds are normal. He exhibits no mass. There is no tenderness.  Genitourinary: Prostate normal and penis normal.  Musculoskeletal: Normal range of motion. He exhibits no edema.  Lymphadenopathy:    He has no cervical adenopathy.  Neurological: He is alert and oriented to person, place, and time. No cranial nerve deficit.  Skin: Skin is warm and dry.  Psychiatric: He has a normal mood and affect. His behavior is normal. Judgment and thought content normal.   BP 148/90 mmHg  Pulse 65  Temp(Src) 97 F (36.1 C) (Oral)  Ht $R'5\' 8"'bZ$  (1.727 m)  Wt 239 lb (108.41 kg)  BMI 36.35 kg/m2        Assessment & Plan:  1. Hyperlipidemia with target LDL less than 100 Low fat diet - NMR, lipoprofile - atorvastatin (LIPITOR) 40 MG tablet; Take 1 tablet (40 mg total) by mouth daily.  Dispense: 90 tablet; Refill: 1  2. Rectal bleeding - Ambulatory referral to Gastroenterology  3. Essential hypertension Do  Not add slat t o diet - CMP14+EGFR - lisinopril (PRINIVIL,ZESTRIL) 20 MG tablet; Take 1 tablet (20 mg total) by mouth daily.  Dispense: 90 tablet; Refill: 1    Labs pending Health maintenance reviewed Diet and exercise encouraged Continue all meds Follow up  In 3 month   [place], FNP

## 2014-08-05 LAB — CMP14+EGFR
A/G RATIO: 1.8 (ref 1.1–2.5)
ALT: 39 IU/L (ref 0–44)
AST: 24 IU/L (ref 0–40)
Albumin: 4.4 g/dL (ref 3.5–5.5)
Alkaline Phosphatase: 60 IU/L (ref 39–117)
BUN / CREAT RATIO: 19 (ref 9–20)
BUN: 21 mg/dL (ref 6–24)
Bilirubin Total: 0.4 mg/dL (ref 0.0–1.2)
CHLORIDE: 102 mmol/L (ref 97–108)
CO2: 26 mmol/L (ref 18–29)
Calcium: 9.4 mg/dL (ref 8.7–10.2)
Creatinine, Ser: 1.1 mg/dL (ref 0.76–1.27)
GFR calc non Af Amer: 76 mL/min/{1.73_m2} (ref >59)
GFR, EST AFRICAN AMERICAN: 88 mL/min/{1.73_m2} (ref >59)
GLOBULIN, TOTAL: 2.5 g/dL (ref 1.5–4.5)
GLUCOSE: 98 mg/dL (ref 65–99)
Potassium: 4.9 mmol/L (ref 3.5–5.2)
Sodium: 141 mmol/L (ref 134–144)
TOTAL PROTEIN: 6.9 g/dL (ref 6.0–8.5)

## 2014-08-05 LAB — NMR, LIPOPROFILE
Cholesterol: 120 mg/dL (ref 100–199)
HDL Cholesterol by NMR: 38 mg/dL — ABNORMAL LOW (ref >39)
HDL Particle Number: 30.1 umol/L — ABNORMAL LOW (ref >=30.5)
LDL PARTICLE NUMBER: 652 nmol/L (ref <1000)
LDL Size: 20.2 nm (ref >20.5)
LDL-C: 51 mg/dL (ref 0–99)
LP-IR Score: 60 — ABNORMAL HIGH (ref <=45)
Small LDL Particle Number: 369 nmol/L (ref <=527)
Triglycerides by NMR: 157 mg/dL — ABNORMAL HIGH (ref 0–149)

## 2014-08-08 ENCOUNTER — Encounter: Payer: Self-pay | Admitting: Internal Medicine

## 2014-08-08 ENCOUNTER — Ambulatory Visit (INDEPENDENT_AMBULATORY_CARE_PROVIDER_SITE_OTHER): Payer: Medicare Other | Admitting: Internal Medicine

## 2014-08-08 VITALS — BP 132/80 | HR 68 | Ht 68.0 in | Wt 237.4 lb

## 2014-08-08 DIAGNOSIS — R151 Fecal smearing: Secondary | ICD-10-CM | POA: Diagnosis not present

## 2014-08-08 DIAGNOSIS — K648 Other hemorrhoids: Secondary | ICD-10-CM | POA: Diagnosis not present

## 2014-08-08 NOTE — Patient Instructions (Signed)
HEMORRHOID BANDING PROCEDURE    FOLLOW-UP CARE   1. The procedure you have had should have been relatively painless since the banding of the area involved does not have nerve endings and there is no pain sensation.  The rubber band cuts off the blood supply to the hemorrhoid and the band may fall off as soon as 48 hours after the banding (the band may occasionally be seen in the toilet bowl following a bowel movement). You may notice a temporary feeling of fullness in the rectum which should respond adequately to plain Tylenol or Motrin.  2. Following the banding, avoid strenuous exercise that evening and resume full activity the next day.  A sitz bath (soaking in a warm tub) or bidet is soothing, and can be useful for cleansing the area after bowel movements.     3. To avoid constipation, take two tablespoons of natural wheat bran, natural oat bran, flax, Benefiber or any over the counter fiber supplement and increase your water intake to 7-8 glasses daily.    4. Unless you have been prescribed anorectal medication, do not put anything inside your rectum for two weeks: No suppositories, enemas, fingers, etc.  5. Occasionally, you may have more bleeding than usual after the banding procedure.  This is often from the untreated hemorrhoids rather than the treated one.  Don't be concerned if there is a tablespoon or so of blood.  If there is more blood than this, lie flat with your bottom higher than your head and apply an ice pack to the area. If the bleeding does not stop within a half an hour or if you feel faint, call our office at [phone] or go to the emergency room.  6. Problems are not common; however, if there is a substantial amount of bleeding, severe pain, chills, fever or difficulty passing urine (very rare) or other problems, you should call us at [phone] or report to the nearest emergency room.  7. Do not stay seated continuously for more than 2-3 hours for a day or two  after the procedure.  Tighten your buttock muscles 10-15 times every two hours and take 10-15 deep breaths every 1-2 hours.  Do not spend more than a few minutes on the toilet if you cannot empty your bowel; instead re-visit the toilet at a later time.    We will see you at your next appointment March 14th 2016 at 3:30pm.  I appreciate the opportunity to care for you. Silvano Rusk, M.D., [facility]

## 2014-08-08 NOTE — Progress Notes (Signed)
Subjective:    Patient ID: Andre Hunt, male    DOB: 02/16/1961, 54 y.o.   MRN: [id number] Cc: Rectal bleeding and seepage HPI The patient is here because of intermittent rectal bleeding and seepage of stool. Bowels regular without straining. He has hx of heavy lifting in past. Colonposcopy 2014 - 1 cm adenoma, hemorrhoids. Preparation H has not helped.  Allergies  Allergen Reactions  . Iohexol Anaphylaxis    Allergy to IVP dye   Outpatient Prescriptions Prior to Visit  Medication Sig Dispense Refill  . allopurinol (ZYLOPRIM) 300 MG tablet Take 300 mg by mouth daily.    Marland Kitchen atorvastatin (LIPITOR) 40 MG tablet Take 1 tablet (40 mg total) by mouth daily. 90 tablet 1  . cetirizine (ZYRTEC) 10 MG tablet TAKE 1 TABLET DAILY 30 tablet 0  . colchicine 0.6 MG tablet Take 0.6 mg by mouth as needed.    . diazepam (VALIUM) 5 MG tablet Take 1 tablet (5 mg total) by mouth every 12 (twelve) hours as needed for anxiety. 30 tablet 1  . HYDROcodone-acetaminophen (NORCO) 7.5-325 MG per tablet     . KRILL OIL PO Take by mouth daily.    Marland Kitchen lisinopril (PRINIVIL,ZESTRIL) 20 MG tablet Take 1 tablet (20 mg total) by mouth daily. 90 tablet 1   No facility-administered medications prior to visit.   Past Medical History  Diagnosis Date  . Gout   . Hyperlipidemia   . Allergy   . Personal history of colonic adenoma 09/28/2012   Past Surgical History  Procedure Laterality Date  . Exploratory laparotomy  1996    after falling on scissors; repair of "hole in intestines"  . Carpal tunnel release      bilateral 2000 and 2001  . Umbilical hernia repair  1968  . Incisional hernia repair  1997  . Elbow surgery  2006    muscle repair  . Appendectomy  1988   Review of Systems As above    Objective:   Physical Exam BP 132/80 mmHg  Pulse 68  Ht 5\' 8"  (1.727 m)  Wt 237 lb 6.4 oz (107.684 kg)  BMI 36.10 kg/m2  SpO2 96% Obese wm NAD Rectal shows NL anoderm. No mass, long anal canal w/ some  spasm   Anoscopy was performed with the patient in the left lateral decubitus position and revealed Grade 2 internal hemorrhoids in all 3 positions  PROCEDURE NOTE: The patient presents with symptomatic grade 2 hemorrhoids, requesting rubber band ligation of his/her hemorrhoidal disease.  All risks, benefits and alternative forms of therapy were described and informed consent was obtained.   The anorectum was pre-medicated with 0.125% NTG and 5% liodcaine The decision was made to band the RP internal hemorrhoid, and the [place] was used to perform band ligation without complication.  Digital anorectal examination was then performed to assure proper positioning of the band, and to adjust the banded tissue as required.  The patient was discharged home without pain or other issues.  Dietary and behavioral recommendations were given and along with follow-up instructions.     The patient will return in 2-3 weeks for  follow-up and possible additional banding as required. No complications were encountered and the patient tolerated the procedure well.    Assessment & Plan:   Internal hemorrhoids with bleeding and seepage RP hemorrhoid banded RTC 2-3 weeks   Fecal soiling RP hemorrhoid banded Will band other hemorrhoids at f/u to tret this    I appreciate the  opportunity to care for this patient. XN:ATFTDD,UKGU MARGARET, FNP

## 2014-08-10 DIAGNOSIS — K648 Other hemorrhoids: Secondary | ICD-10-CM | POA: Insufficient documentation

## 2014-08-10 DIAGNOSIS — R151 Fecal smearing: Secondary | ICD-10-CM | POA: Insufficient documentation

## 2014-08-10 NOTE — Assessment & Plan Note (Signed)
RP hemorrhoid banded RTC 2-3 weeks

## 2014-08-10 NOTE — Assessment & Plan Note (Signed)
RP hemorrhoid banded Will band other hemorrhoids at f/u to tret this

## 2014-08-24 DIAGNOSIS — M542 Cervicalgia: Secondary | ICD-10-CM | POA: Diagnosis not present

## 2014-08-24 DIAGNOSIS — I10 Essential (primary) hypertension: Secondary | ICD-10-CM | POA: Diagnosis not present

## 2014-08-28 ENCOUNTER — Encounter: Payer: Self-pay | Admitting: Internal Medicine

## 2014-08-28 ENCOUNTER — Ambulatory Visit (INDEPENDENT_AMBULATORY_CARE_PROVIDER_SITE_OTHER): Payer: Medicare Other | Admitting: Internal Medicine

## 2014-08-28 VITALS — BP 116/70 | HR 56 | Ht 67.5 in | Wt 234.5 lb

## 2014-08-28 DIAGNOSIS — K648 Other hemorrhoids: Secondary | ICD-10-CM

## 2014-08-28 DIAGNOSIS — R151 Fecal smearing: Secondary | ICD-10-CM

## 2014-08-28 NOTE — Patient Instructions (Signed)
HEMORRHOID BANDING PROCEDURE    FOLLOW-UP CARE   1. The procedure you have had should have been relatively painless since the banding of the area involved does not have nerve endings and there is no pain sensation.  The rubber band cuts off the blood supply to the hemorrhoid and the band may fall off as soon as 48 hours after the banding (the band may occasionally be seen in the toilet bowl following a bowel movement). You may notice a temporary feeling of fullness in the rectum which should respond adequately to plain Tylenol or Motrin.  2. Following the banding, avoid strenuous exercise that evening and resume full activity the next day.  A sitz bath (soaking in a warm tub) or bidet is soothing, and can be useful for cleansing the area after bowel movements.     3. To avoid constipation, take two tablespoons of natural wheat bran, natural oat bran, flax, Benefiber or any over the counter fiber supplement and increase your water intake to 7-8 glasses daily.    4. Unless you have been prescribed anorectal medication, do not put anything inside your rectum for two weeks: No suppositories, enemas, fingers, etc.  5. Occasionally, you may have more bleeding than usual after the banding procedure.  This is often from the untreated hemorrhoids rather than the treated one.  Don't be concerned if there is a tablespoon or so of blood.  If there is more blood than this, lie flat with your bottom higher than your head and apply an ice pack to the area. If the bleeding does not stop within a half an hour or if you feel faint, call our office at [phone] or go to the emergency room.  6. Problems are not common; however, if there is a substantial amount of bleeding, severe pain, chills, fever or difficulty passing urine (very rare) or other problems, you should call us at [phone] or report to the nearest emergency room.  7. Do not stay seated continuously for more than 2-3 hours for a day or two  after the procedure.  Tighten your buttock muscles 10-15 times every two hours and take 10-15 deep breaths every 1-2 hours.  Do not spend more than a few minutes on the toilet if you cannot empty your bowel; instead re-visit the toilet at a later time.    See how your doing in 2 months and if still having problems call us back.    I appreciate the opportunity to care for you. Silvano Rusk, M.D., [facility]

## 2014-08-28 NOTE — Assessment & Plan Note (Addendum)
RA and LL banded today. He will wait 2 months if he is having persistent symptoms she will call back about possible repeat banding.

## 2014-08-28 NOTE — Progress Notes (Signed)
Patient ID: Andre Hunt, male   DOB: 11/30/1960, 54 y.o.   MRN: [id number]         Patient presents for repeat hemorrhoidal banding. He had symptoms of rectal bleeding and fecal seepage the says are about 50% better after banding the right posterior internal hemorrhoid about 3 weeks ago.  PROCEDURE NOTE: The patient presents with symptomatic grade 2  hemorrhoids, requesting rubber band ligation of his/her hemorrhoidal disease.  All risks, benefits and alternative forms of therapy were described and informed consent was obtained.   The anorectum was pre-medicated with 0.125% NTG and 5% lidocaine The decision was made to band the RA and LL internal hemorrhoids, and the [place] was used to perform band ligation without complication.  Digital anorectal examination was then performed to assure proper positioning of the band, and to adjust the banded tissue as required.  The patient was discharged home without pain or other issues.  Dietary and behavioral recommendations were given and along with follow-up instructions.       The patient will return as needed for follow-up and possible additional banding as required. No complications were encountered and the patient tolerated the procedure well.   I appreciate the opportunity to care for this patient. DH:WYSHUO,HFGB MARGARET, FNP

## 2014-10-02 ENCOUNTER — Other Ambulatory Visit: Payer: Self-pay | Admitting: Nurse Practitioner

## 2014-10-03 NOTE — Telephone Encounter (Signed)
rx called into pharmacy

## 2014-10-03 NOTE — Telephone Encounter (Signed)
Please call in valium with 1 refills

## 2014-10-03 NOTE — Telephone Encounter (Signed)
Last seen 08/04/14 MMM if approved route to nurse to call into [facility]

## 2014-10-05 DIAGNOSIS — M542 Cervicalgia: Secondary | ICD-10-CM | POA: Diagnosis not present

## 2014-10-05 DIAGNOSIS — Z6820 Body mass index (BMI) 20.0-20.9, adult: Secondary | ICD-10-CM | POA: Diagnosis not present

## 2014-10-05 DIAGNOSIS — I10 Essential (primary) hypertension: Secondary | ICD-10-CM | POA: Diagnosis not present

## 2014-11-03 ENCOUNTER — Ambulatory Visit: Payer: Medicare Other | Admitting: Nurse Practitioner

## 2014-11-16 DIAGNOSIS — M542 Cervicalgia: Secondary | ICD-10-CM | POA: Diagnosis not present

## 2014-11-16 DIAGNOSIS — Z6835 Body mass index (BMI) 35.0-35.9, adult: Secondary | ICD-10-CM | POA: Diagnosis not present

## 2014-11-16 DIAGNOSIS — M25511 Pain in right shoulder: Secondary | ICD-10-CM | POA: Diagnosis not present

## 2014-11-16 DIAGNOSIS — I10 Essential (primary) hypertension: Secondary | ICD-10-CM | POA: Diagnosis not present

## 2014-12-07 ENCOUNTER — Other Ambulatory Visit: Payer: Self-pay | Admitting: Nurse Practitioner

## 2014-12-07 NOTE — Telephone Encounter (Signed)
Last seen 08/04/14 MMM If approved route to nurse to call into [facility]

## 2014-12-07 NOTE — Telephone Encounter (Signed)
Please call in valium with 1 refills

## 2014-12-07 NOTE — Telephone Encounter (Signed)
RF called to pharmacy

## 2014-12-13 ENCOUNTER — Encounter: Payer: Self-pay | Admitting: Nurse Practitioner

## 2014-12-13 ENCOUNTER — Ambulatory Visit (INDEPENDENT_AMBULATORY_CARE_PROVIDER_SITE_OTHER): Payer: Medicare Other | Admitting: Nurse Practitioner

## 2014-12-13 VITALS — BP 106/73 | HR 66 | Temp 97.3°F | Ht 67.5 in | Wt 228.2 lb

## 2014-12-13 DIAGNOSIS — G4700 Insomnia, unspecified: Secondary | ICD-10-CM | POA: Diagnosis not present

## 2014-12-13 DIAGNOSIS — I10 Essential (primary) hypertension: Secondary | ICD-10-CM | POA: Diagnosis not present

## 2014-12-13 DIAGNOSIS — M1A00X0 Idiopathic chronic gout, unspecified site, without tophus (tophi): Secondary | ICD-10-CM

## 2014-12-13 DIAGNOSIS — Z6835 Body mass index (BMI) 35.0-35.9, adult: Secondary | ICD-10-CM | POA: Diagnosis not present

## 2014-12-13 DIAGNOSIS — M1000 Idiopathic gout, unspecified site: Secondary | ICD-10-CM | POA: Insufficient documentation

## 2014-12-13 DIAGNOSIS — E785 Hyperlipidemia, unspecified: Secondary | ICD-10-CM

## 2014-12-13 MED ORDER — LISINOPRIL 20 MG PO TABS: ORAL_TABLET | ORAL | Status: DC

## 2014-12-13 MED ORDER — DIAZEPAM 5 MG PO TABS: ORAL_TABLET | ORAL | Status: DC

## 2014-12-13 MED ORDER — ATORVASTATIN CALCIUM 40 MG PO TABS: 40.0000 mg | ORAL_TABLET | Freq: Every day | ORAL | Status: DC

## 2014-12-13 NOTE — Patient Instructions (Signed)
Exercise to Stay Healthy Exercise helps you become and stay healthy. EXERCISE IDEAS AND TIPS Choose exercises that:  You enjoy.  Fit into your day. You do not need to exercise really hard to be healthy. You can do exercises at a slow or medium level and stay healthy. You can:  Stretch before and after working out.  Try yoga, Pilates, or tai chi.  Lift weights.  Walk fast, swim, jog, run, climb stairs, bicycle, dance, or rollerskate.  Take aerobic classes. Exercises that burn about 150 calories:  Running 1  miles in 15 minutes.  Playing volleyball for 45 to 60 minutes.  Washing and waxing a car for 45 to 60 minutes.  Playing touch football for 45 minutes.  Walking 1  miles in 35 minutes.  Pushing a stroller 1  miles in 30 minutes.  Playing basketball for 30 minutes.  Raking leaves for 30 minutes.  Bicycling 5 miles in 30 minutes.  Walking 2 miles in 30 minutes.  Dancing for 30 minutes.  Shoveling snow for 15 minutes.  Swimming laps for 20 minutes.  Walking up stairs for 15 minutes.  Bicycling 4 miles in 15 minutes.  Gardening for 30 to 45 minutes.  Jumping rope for 15 minutes.  Washing windows or floors for 45 to 60 minutes. Document Released: 07/05/2010 Document Revised: 08/25/2011 Document Reviewed: 07/05/2010 Exit[employer] Patient Information 2015 [employer]. This information is not intended to replace advice given to you by your health care provider. Make sure you discuss any questions you have with your health care provider.  

## 2014-12-13 NOTE — Addendum Note (Signed)
Addended by: Chevis Pretty on: 12/13/2014 04:45 PM   Modules accepted: Orders

## 2014-12-13 NOTE — Progress Notes (Signed)
Subjective:    Patient ID: Andre Hunt, male    DOB: 06/30/1960, 54 y.o.   MRN: [id number]   Patient here today for follow up of chronic medical problems.   Hyperlipidemia This is a chronic problem. The current episode started more than 1 year ago. The problem is uncontrolled. Recent lipid tests were reviewed and are variable. Exacerbating diseases include obesity. He has no history of diabetes or hypothyroidism. Current antihyperlipidemic treatment includes statins. The current treatment provides moderate improvement of lipids. Compliance problems include adherence to diet and adherence to exercise.  Risk factors for coronary artery disease include dyslipidemia, male sex and obesity.  Hypertension This is a chronic problem. The current episode started more than 1 year ago. The problem has been waxing and waning since onset. Risk factors for coronary artery disease include dyslipidemia, male gender and sedentary lifestyle. Past treatments include ACE inhibitors. The current treatment provides moderate improvement. Compliance problems include diet and exercise.  There is no history of CAD/MI, CVA or heart failure.  GOUT On allopurinol- last flare up was over 6  Months ago. insomnia Takes valium at night- helps him to relax   Review of Systems  Constitutional: Negative.   HENT: Negative.   Respiratory: Negative.   Cardiovascular: Negative.   Genitourinary: Negative.   Neurological: Negative.   Psychiatric/Behavioral: Negative.   All other systems reviewed and are negative.      Objective:   Physical Exam  Constitutional: He is oriented to person, place, and time. He appears well-developed and well-nourished.  HENT:  Head: Normocephalic.  Right Ear: External ear normal.  Left Ear: External ear normal.  Nose: Nose normal.  Mouth/Throat: Oropharynx is clear and moist.  Eyes: EOM are normal. Pupils are equal, round, and reactive to light.  Neck: Normal range of motion. Neck  supple. No JVD present. No thyromegaly present.  Cardiovascular: Normal rate, regular rhythm and intact distal pulses.  Exam reveals gallop and S3. Exam reveals no friction rub.   No murmur heard. Pulmonary/Chest: Effort normal and breath sounds normal. No respiratory distress. He has no wheezes. He has no rales. He exhibits no tenderness.  Abdominal: Soft. Bowel sounds are normal. He exhibits no mass. There is no tenderness.  Genitourinary: Prostate normal and penis normal.  Musculoskeletal: Normal range of motion. He exhibits no edema.  Lymphadenopathy:    He has no cervical adenopathy.  Neurological: He is alert and oriented to person, place, and time. No cranial nerve deficit.  Skin: Skin is warm and dry.  Psychiatric: He has a normal mood and affect. His behavior is normal. Judgment and thought content normal.    BP 106/73 mmHg  Pulse 66  Temp(Src) 97.3 F (36.3 C) (Oral)  Ht 5' 7.5" (1.715 m)  Wt 228 lb 3.2 oz (103.511 kg)  BMI 35.19 kg/m2        Assessment & Plan:   1. Essential hypertension Do not add salt to diet - lisinopril (PRINIVIL,ZESTRIL) 20 MG tablet; 1/2 QOD  Dispense: 90 tablet; Refill: 1 - CMP14+EGFR  2. Hyperlipidemia with target LDL less than 100 Low fat diet - atorvastatin (LIPITOR) 40 MG tablet; Take 1 tablet (40 mg total) by mouth daily.  Dispense: 90 tablet; Refill: 1 - Lipid panel  3. Idiopathic chronic gout without tophus, unspecified site  4. BMI 35.0-35.9,adult Discussed diet and exercise for person with BMI >25 Will recheck weight in 3-6 months   5. Insomnia Bedtime ritual - diazepam (VALIUM) 5 MG tablet; TAKE  1 TABLET EVERY 12 HOURS AS NEEDED FOR ANXIETY  Dispense: 30 tablet; Refill: [place], FNP

## 2014-12-14 LAB — CMP14+EGFR
ALT: 38 IU/L (ref 0–44)
AST: 27 IU/L (ref 0–40)
Albumin/Globulin Ratio: 1.7 (ref 1.1–2.5)
Albumin: 4.3 g/dL (ref 3.5–5.5)
Alkaline Phosphatase: 66 IU/L (ref 39–117)
BUN / CREAT RATIO: 20 (ref 9–20)
BUN: 21 mg/dL (ref 6–24)
Bilirubin Total: 0.5 mg/dL (ref 0.0–1.2)
CO2: 26 mmol/L (ref 18–29)
CREATININE: 1.05 mg/dL (ref 0.76–1.27)
Calcium: 9.3 mg/dL (ref 8.7–10.2)
Chloride: 100 mmol/L (ref 97–108)
GFR calc non Af Amer: 81 mL/min/{1.73_m2} (ref >59)
GFR, EST AFRICAN AMERICAN: 93 mL/min/{1.73_m2} (ref >59)
GLOBULIN, TOTAL: 2.5 g/dL (ref 1.5–4.5)
GLUCOSE: 90 mg/dL (ref 65–99)
Potassium: 4.6 mmol/L (ref 3.5–5.2)
Sodium: 141 mmol/L (ref 134–144)
Total Protein: 6.8 g/dL (ref 6.0–8.5)

## 2014-12-14 LAB — LIPID PANEL
CHOLESTEROL TOTAL: 124 mg/dL (ref 100–199)
Chol/HDL Ratio: 3.6 ratio units (ref 0.0–5.0)
HDL: 34 mg/dL — AB (ref >39)
LDL Calculated: 37 mg/dL (ref 0–99)
Triglycerides: 267 mg/dL — ABNORMAL HIGH (ref 0–149)
VLDL Cholesterol Cal: 53 mg/dL — ABNORMAL HIGH (ref 5–40)

## 2014-12-28 DIAGNOSIS — M542 Cervicalgia: Secondary | ICD-10-CM | POA: Diagnosis not present

## 2014-12-28 DIAGNOSIS — M25512 Pain in left shoulder: Secondary | ICD-10-CM | POA: Diagnosis not present

## 2014-12-28 DIAGNOSIS — Z6835 Body mass index (BMI) 35.0-35.9, adult: Secondary | ICD-10-CM | POA: Diagnosis not present

## 2014-12-28 DIAGNOSIS — I10 Essential (primary) hypertension: Secondary | ICD-10-CM | POA: Diagnosis not present

## 2015-02-22 DIAGNOSIS — M542 Cervicalgia: Secondary | ICD-10-CM | POA: Diagnosis not present

## 2015-02-22 DIAGNOSIS — Z6835 Body mass index (BMI) 35.0-35.9, adult: Secondary | ICD-10-CM | POA: Diagnosis not present

## 2015-02-22 DIAGNOSIS — I10 Essential (primary) hypertension: Secondary | ICD-10-CM | POA: Diagnosis not present

## 2015-02-22 DIAGNOSIS — M25512 Pain in left shoulder: Secondary | ICD-10-CM | POA: Diagnosis not present

## 2015-03-14 ENCOUNTER — Other Ambulatory Visit: Payer: Self-pay | Admitting: Nurse Practitioner

## 2015-03-15 NOTE — Telephone Encounter (Signed)
Last seen 12/13/14 MMM If approved route to nurse to call into [facility]

## 2015-03-15 NOTE — Telephone Encounter (Signed)
rx called into pharmacy

## 2015-03-15 NOTE — Telephone Encounter (Signed)
OK to phone in above prescription, no refills.

## 2015-04-17 ENCOUNTER — Other Ambulatory Visit: Payer: Self-pay | Admitting: Pediatrics

## 2015-04-17 NOTE — Telephone Encounter (Signed)
rx called to pharmacy 

## 2015-04-17 NOTE — Telephone Encounter (Signed)
Please call in valium with 1 refills

## 2015-04-17 NOTE — Telephone Encounter (Signed)
Last filled 03/15/15, last seen 12/13/14. Call in at [facility]

## 2015-05-17 ENCOUNTER — Other Ambulatory Visit: Payer: Self-pay | Admitting: Nurse Practitioner

## 2015-05-17 NOTE — Telephone Encounter (Signed)
Please call in diazepam with   1 refills 

## 2015-05-17 NOTE — Telephone Encounter (Signed)
Called into [facility].  

## 2015-05-17 NOTE — Telephone Encounter (Signed)
Last seen 12/13/14, last filled 04/17/15. Call in at [place] if approved

## 2015-05-29 DIAGNOSIS — M542 Cervicalgia: Secondary | ICD-10-CM | POA: Diagnosis not present

## 2015-05-29 DIAGNOSIS — M25512 Pain in left shoulder: Secondary | ICD-10-CM | POA: Diagnosis not present

## 2015-05-29 DIAGNOSIS — I10 Essential (primary) hypertension: Secondary | ICD-10-CM | POA: Diagnosis not present

## 2015-05-29 DIAGNOSIS — M25521 Pain in right elbow: Secondary | ICD-10-CM | POA: Diagnosis not present

## 2015-05-29 DIAGNOSIS — Z6836 Body mass index (BMI) 36.0-36.9, adult: Secondary | ICD-10-CM | POA: Diagnosis not present

## 2015-07-16 ENCOUNTER — Other Ambulatory Visit: Payer: Self-pay | Admitting: Nurse Practitioner

## 2015-07-16 NOTE — Telephone Encounter (Signed)
Last filled 12/13/14, not seen since 12/13/14. Pt uses [facility]

## 2015-07-16 NOTE — Telephone Encounter (Signed)
Please call in valium with 1 refills

## 2015-07-16 NOTE — Telephone Encounter (Signed)
rx called into pharmacy

## 2015-08-02 ENCOUNTER — Ambulatory Visit (INDEPENDENT_AMBULATORY_CARE_PROVIDER_SITE_OTHER): Payer: Medicare Other | Admitting: Orthopaedic Surgery

## 2015-08-02 ENCOUNTER — Encounter: Payer: Self-pay | Admitting: Orthopaedic Surgery

## 2015-08-02 VITALS — BP 122/74 | HR 57 | Temp 98.1°F | Ht 68.0 in | Wt 228.2 lb

## 2015-08-02 DIAGNOSIS — M25511 Pain in right shoulder: Secondary | ICD-10-CM

## 2015-08-02 DIAGNOSIS — M7711 Lateral epicondylitis, right elbow: Secondary | ICD-10-CM | POA: Diagnosis not present

## 2015-08-02 DIAGNOSIS — M7710 Lateral epicondylitis, unspecified elbow: Secondary | ICD-10-CM | POA: Insufficient documentation

## 2015-08-02 MED ORDER — COLCHICINE 0.6 MG PO TABS: ORAL_TABLET | ORAL | Status: DC

## 2015-08-02 MED ORDER — ALLOPURINOL 300 MG PO TABS: 300.0000 mg | ORAL_TABLET | Freq: Every day | ORAL | Status: DC

## 2015-08-02 MED ORDER — HYDROCODONE-ACETAMINOPHEN 7.5-325 MG PO TABS: 1.0000 | ORAL_TABLET | ORAL | Status: DC | PRN

## 2015-08-02 NOTE — Patient Instructions (Signed)
Resume gout medicine.

## 2015-08-02 NOTE — Progress Notes (Signed)
Patient Andre Hunt, male DOB:Mar 04, 1961, 55 y.o. [id number]  Chief Complaint  Patient presents with  . Arm Pain  . Shoulder Pain    right    HPI  Andre Hunt is a 55 y.o. male who has chronic pain in the right shoulder and chronic right lateral epicondylitis.  He also has a flare up of his gout. He is out of his allopurinol.  I will refill it plus the colchicine. I have told him in the past not to run out of medicine.   Arm Pain  The incident occurred more than 1 week ago. There was no injury mechanism. The pain is present in the right elbow and right shoulder. The quality of the pain is described as aching. The pain is at a severity of 3/10. The pain is mild. The pain has been fluctuating since the incident. Pertinent negatives include no numbness or tingling. He has tried acetaminophen, ice, NSAIDs, immobilization and rest for the symptoms. The treatment provided moderate relief.  Shoulder Pain  Pertinent negatives include no numbness or tingling.    Body mass index is 34.71 kg/(m^2).  Review of Systems  Patient does not have Diabetes Mellitus. Patient has hypertension. Patient does not have COPD or shortness of breath. Patient does not have BMI > 35. Patient does not have current smoking history.  Review of Systems  Neurological: Negative for tingling and numbness.    Past Medical History  Diagnosis Date  . Gout   . Hyperlipidemia   . Allergy   . Personal history of colonic adenoma 09/28/2012    Past Surgical History  Procedure Laterality Date  . Exploratory laparotomy  1996    after falling on scissors; repair of "hole in intestines"  . Carpal tunnel release      bilateral 2000 and 2001  . Umbilical hernia repair  1968  . Incisional hernia repair  1997  . Elbow surgery  2006    muscle repair  . Appendectomy  1988  . Colonoscopy      Family History  Problem Relation Age of Onset  . Colon cancer Neg Hx   . Kidney cancer Mother   . Diabetes  Mother   . COPD Mother   . Anuerysm Father   . Diabetes Father   . Heart disease Father   . Diabetes Sister     Social History Social History  Substance Use Topics  . Smoking status: Never Smoker   . Smokeless tobacco: Current User    Types: Snuff  . Alcohol Use: No    Allergies  Allergen Reactions  . Iohexol Anaphylaxis    Allergy to IVP dye    Current Outpatient Prescriptions  Medication Sig Dispense Refill  . allopurinol (ZYLOPRIM) 300 MG tablet Take 1 tablet (300 mg total) by mouth daily. 30 tablet 5  . atorvastatin (LIPITOR) 40 MG tablet Take 1 tablet (40 mg total) by mouth daily. 90 tablet 1  . cetirizine (ZYRTEC) 10 MG tablet TAKE 1 TABLET DAILY (Patient taking differently: TAKE 1 TABLET AS NEEDED) 30 tablet 0  . colchicine 0.6 MG tablet One tid po for five days. 15 tablet 5  . diazepam (VALIUM) 5 MG tablet TAKE 1 TABLET EVERY 12 HOURS AS NEEDED FOR ANXIETY 30 tablet 1  . lisinopril (PRINIVIL,ZESTRIL) 20 MG tablet 1/2 QOD 90 tablet 1  . HYDROcodone-acetaminophen (NORCO) 7.5-325 MG tablet Take 1 tablet by mouth every 4 (four) hours as needed for moderate pain (Must last 30 days.  Do  not drive or operate machinery while taking this medicine.). 120 tablet 0   No current facility-administered medications for this visit.     Physical Exam  Blood pressure 122/74, pulse 57, temperature 98.1 F (36.7 C), height 5\' 8"  (1.727 m), weight 228 lb 3.2 oz (103.511 kg).  Constitutional: overall normal hygiene, normal nutrition, well developed, normal grooming, normal body habitus. Assistive device:none  Musculoskeletal: gait and station Limp none, muscle tone and strength are normal, no tremors or atrophy is present.  .  Neurological: coordination overall normal.  Deep tendon reflex/nerve stretch intact.  Sensation normal.  Cranial nerves II-XII intact.   Skin:   normal overall no scars, lesions, ulcers or rashes except for right elbow area with healed scar.. No  psoriasis.  Psychiatric: Alert and oriented x 3.  Recent memory intact, remote memory unclear.  Normal mood and affect. Well groomed.  Good eye contact.  Cardiovascular: overall no swelling, no varicosities, no edema bilaterally, normal temperatures of the legs and arms, no clubbing, cyanosis and good capillary refill.  Lymphatic: palpation is normal.   Extremities:the right shoulder is tender as is the lateral epicondyle area on the right.  He has well healed scar of the right elbow Inspection normal Strength and tone normal Range of motion full of the elbow, full of the shoulder with tenderness to resisted abduction.  Additional services performed: discussed he needs to continue the gout medicine all the time.  The patient has been educated about the nature of the problem(s) and counseled on treatment options.  The patient appeared to understand what I have discussed and is in agreement with it.  PLAN Call if any problems.  Precautions discussed.  Continue current medications.   Return to clinic three weeks.

## 2015-08-23 ENCOUNTER — Ambulatory Visit (INDEPENDENT_AMBULATORY_CARE_PROVIDER_SITE_OTHER): Payer: Medicare Other | Admitting: Orthopaedic Surgery

## 2015-08-23 VITALS — BP 143/89 | HR 63 | Temp 98.1°F | Ht 68.0 in | Wt 228.2 lb

## 2015-08-23 DIAGNOSIS — M25511 Pain in right shoulder: Secondary | ICD-10-CM | POA: Diagnosis not present

## 2015-08-23 NOTE — Progress Notes (Signed)
Patient Andre Hunt, male DOB:Jun 05, 1961, 55 y.o. [id number]  Chief Complaint  Patient presents with  . Follow-up    neck and shoulder    HPI  Andre Hunt is a 55 y.o. male his right shoulder is tender today.  He has a small lipoma to develop laterally near the Deltoid insertion.  It is not red.  It is not swollen.  He has good motion, no trauma.  Shoulder Pain  The pain is present in the right shoulder. This is a chronic problem. The current episode started more than 1 year ago. The problem occurs intermittently. The problem has been waxing and waning. The quality of the pain is described as aching. The pain is at a severity of 2/10. The pain is mild. He has tried OTC ointments, NSAIDS and rest for the symptoms. The treatment provided moderate relief.    Body mass index is 34.71 kg/(m^2).  Review of Systems  Patient does not have Diabetes Mellitus. Patient has hypertension. Patient does not have COPD or shortness of breath. Patient does not have BMI > 35. Patient does not have current smoking history.  Review of Systems  Past Medical History  Diagnosis Date  . Gout   . Hyperlipidemia   . Allergy   . Personal history of colonic adenoma 09/28/2012    Past Surgical History  Procedure Laterality Date  . Exploratory laparotomy  1996    after falling on scissors; repair of "hole in intestines"  . Carpal tunnel release      bilateral 2000 and 2001  . Umbilical hernia repair  1968  . Incisional hernia repair  1997  . Elbow surgery  2006    muscle repair  . Appendectomy  1988  . Colonoscopy      Family History  Problem Relation Age of Onset  . Colon cancer Neg Hx   . Kidney cancer Mother   . Diabetes Mother   . COPD Mother   . Anuerysm Father   . Diabetes Father   . Heart disease Father   . Diabetes Sister     Social History Social History  Substance Use Topics  . Smoking status: Never Smoker   . Smokeless tobacco: Current User    Types: Snuff  .  Alcohol Use: No    Allergies  Allergen Reactions  . Iohexol Anaphylaxis    Allergy to IVP dye    Current Outpatient Prescriptions  Medication Sig Dispense Refill  . allopurinol (ZYLOPRIM) 300 MG tablet Take 1 tablet (300 mg total) by mouth daily. 30 tablet 5  . atorvastatin (LIPITOR) 40 MG tablet Take 1 tablet (40 mg total) by mouth daily. 90 tablet 1  . cetirizine (ZYRTEC) 10 MG tablet TAKE 1 TABLET DAILY (Patient taking differently: TAKE 1 TABLET AS NEEDED) 30 tablet 0  . colchicine 0.6 MG tablet One tid po for five days. 15 tablet 5  . diazepam (VALIUM) 5 MG tablet TAKE 1 TABLET EVERY 12 HOURS AS NEEDED FOR ANXIETY 30 tablet 1  . HYDROcodone-acetaminophen (NORCO) 7.5-325 MG tablet Take 1 tablet by mouth every 4 (four) hours as needed for moderate pain (Must last 30 days.  Do not drive or operate machinery while taking this medicine.). 120 tablet 0  . lisinopril (PRINIVIL,ZESTRIL) 20 MG tablet 1/2 QOD 90 tablet 1   No current facility-administered medications for this visit.     Physical Exam  Blood pressure 143/89, pulse 63, temperature 98.1 F (36.7 C), height 5\' 8"  (1.727 m), weight 228  lb 3.2 oz (103.511 kg).  Constitutional: overall normal hygiene, normal nutrition, well developed, normal grooming, normal body habitus. Assistive device:none  Musculoskeletal: gait and station Limp none, muscle tone and strength are normal, no tremors or atrophy is present.  .  Neurological: coordination overall normal.  Deep tendon reflex/nerve stretch intact.  Sensation normal.  Cranial nerves II-XII intact.   Skin:   normal overall no scars except for right elbow area, lesions, ulcers or rashes. No psoriasis.  Psychiatric: Alert and oriented x 3.  Recent memory intact, remote memory unclear.  Normal mood and affect. Well groomed.  Good eye contact.  Cardiovascular: overall no swelling, no varicosities, no edema bilaterally, normal temperatures of the legs and arms, no clubbing, cyanosis  and good capillary refill.  Lymphatic: palpation is normal. Examination of right Upper Extremity is done.  Inspection:   Overall:  Elbow non-tender without crepitus or defects, forearm non-tender without crepitus or defects, wrist non-tender without crepitus or defects, hand non-tender.    Shoulder: with glenohumeral joint tenderness, without effusion.   Upper arm: without swelling and tenderness   Range of motion:   Overall:  Full range of motion of the elbow, full range of motion of wrist and full range of motion in fingers.   Shoulder:  right  180 degrees forward flexion; 180 degrees abduction; 40 degrees internal rotation, 40 degrees external rotation, 25 degrees extension, 40 degrees adduction.   Stability:   Overall:  Shoulder, elbow and wrist stable   Strength and Tone:   Overall full shoulder muscles strength, full upper arm strength and normal upper arm bulk and tone.    The patient has been educated about the nature of the problem(s) and counseled on treatment options.  The patient appeared to understand what I have discussed and is in agreement with it.  PLAN Call if any problems.  Precautions discussed.  Continue current medications.   Return to clinic one month

## 2015-08-25 ENCOUNTER — Ambulatory Visit (INDEPENDENT_AMBULATORY_CARE_PROVIDER_SITE_OTHER): Payer: Medicare Other | Admitting: Family Medicine

## 2015-08-25 ENCOUNTER — Encounter: Payer: Self-pay | Admitting: Family Medicine

## 2015-08-25 VITALS — BP 103/63 | HR 69 | Temp 98.1°F | Ht 68.0 in | Wt 230.0 lb

## 2015-08-25 DIAGNOSIS — R05 Cough: Secondary | ICD-10-CM | POA: Diagnosis not present

## 2015-08-25 DIAGNOSIS — R059 Cough, unspecified: Secondary | ICD-10-CM

## 2015-08-25 DIAGNOSIS — J111 Influenza due to unidentified influenza virus with other respiratory manifestations: Secondary | ICD-10-CM | POA: Diagnosis not present

## 2015-08-25 LAB — VERITOR FLU A/B WAIVED
INFLUENZA A: POSITIVE — AB
Influenza B: NEGATIVE

## 2015-08-25 MED ORDER — FLUTICASONE PROPIONATE 50 MCG/ACT NA SUSP: 1.0000 | Freq: Two times a day (BID) | NASAL | Status: DC | PRN

## 2015-08-25 MED ORDER — OSELTAMIVIR PHOSPHATE 75 MG PO CAPS: 75.0000 mg | ORAL_CAPSULE | Freq: Two times a day (BID) | ORAL | Status: DC

## 2015-08-25 NOTE — Progress Notes (Signed)
BP 103/63 mmHg  Pulse 69  Temp(Src) 98.1 F (36.7 C) (Oral)  Ht 5\' 8"  (1.727 m)  Wt 230 lb (104.327 kg)  BMI 34.98 kg/m2   Subjective:    Patient ID: Andre Hunt, male    DOB: Mar 18, 1961, 55 y.o.   MRN: [id number]  HPI: Andre Hunt is a 55 y.o. male presenting on 08/25/2015 for Cough   HPI Cough and congestion and postnasal drainage Patient has been having cough and congestion and postnasal drainage and head congestion and he feels like he is in a fog. He denies any fevers or chills or aches. He does have malaise. He denies any shortness of breath or wheezing. He denies any sick contacts that he knows of.  Relevant past medical, surgical, family and social history reviewed and updated as indicated. Interim medical history since our last visit reviewed. Allergies and medications reviewed and updated.  Review of Systems  Constitutional: Negative for fever and chills.  HENT: Positive for congestion, postnasal drip, rhinorrhea, sinus pressure, sneezing and sore throat. Negative for ear discharge, ear pain and voice change.   Eyes: Negative for pain, discharge, redness and visual disturbance.  Respiratory: Positive for cough. Negative for chest tightness, shortness of breath and wheezing.   Cardiovascular: Negative for chest pain and leg swelling.  Gastrointestinal: Negative for abdominal pain, diarrhea and constipation.  Genitourinary: Negative for difficulty urinating.  Musculoskeletal: Negative for back pain and gait problem.  Skin: Negative for rash.  Neurological: Negative for syncope, light-headedness and headaches.  All other systems reviewed and are negative.   Per HPI unless specifically indicated above     Medication List       This list is accurate as of: 08/25/15 11:30 AM.  Always use your most recent med list.               allopurinol 300 MG tablet  Commonly known as:  ZYLOPRIM  Take 1 tablet (300 mg total) by mouth daily.     atorvastatin 40 MG  tablet  Commonly known as:  LIPITOR  Take 1 tablet (40 mg total) by mouth daily.     cetirizine 10 MG tablet  Commonly known as:  ZYRTEC  TAKE 1 TABLET DAILY     colchicine 0.6 MG tablet  One tid po for five days.     diazepam 5 MG tablet  Commonly known as:  VALIUM  TAKE 1 TABLET EVERY 12 HOURS AS NEEDED FOR ANXIETY     HYDROcodone-acetaminophen 7.5-325 MG tablet  Commonly known as:  NORCO  Take 1 tablet by mouth every 4 (four) hours as needed for moderate pain (Must last 30 days.  Do not drive or operate machinery while taking this medicine.).     lisinopril 20 MG tablet  Commonly known as:  PRINIVIL,ZESTRIL  1/2 QOD           Objective:    BP 103/63 mmHg  Pulse 69  Temp(Src) 98.1 F (36.7 C) (Oral)  Ht 5\' 8"  (1.727 m)  Wt 230 lb (104.327 kg)  BMI 34.98 kg/m2  Wt Readings from Last 3 Encounters:  08/25/15 230 lb (104.327 kg)  08/23/15 228 lb 3.2 oz (103.511 kg)  08/02/15 228 lb 3.2 oz (103.511 kg)    Physical Exam  Constitutional: He is oriented to person, place, and time. He appears well-developed and well-nourished. No distress.  HENT:  Right Ear: Tympanic membrane, external ear and ear canal normal.  Left Ear: Tympanic membrane, external ear  and ear canal normal.  Nose: Mucosal edema and rhinorrhea present. No sinus tenderness. No epistaxis. Right sinus exhibits maxillary sinus tenderness. Right sinus exhibits no frontal sinus tenderness. Left sinus exhibits maxillary sinus tenderness. Left sinus exhibits no frontal sinus tenderness.  Mouth/Throat: Uvula is midline and mucous membranes are normal. Posterior oropharyngeal edema and posterior oropharyngeal erythema present. No oropharyngeal exudate or tonsillar abscesses.  Eyes: Conjunctivae and EOM are normal. Pupils are equal, round, and reactive to light. Right eye exhibits no discharge. No scleral icterus.  Neck: Neck supple. No thyromegaly present.  Cardiovascular: Normal rate, regular rhythm, normal heart  sounds and intact distal pulses.   No murmur heard. Pulmonary/Chest: Effort normal and breath sounds normal. No respiratory distress. He has no wheezes. He has no rales.  Musculoskeletal: Normal range of motion. He exhibits no edema.  Lymphadenopathy:    He has no cervical adenopathy.  Neurological: He is alert and oriented to person, place, and time. Coordination normal.  Skin: Skin is warm and dry. No rash noted. He is not diaphoretic.  Psychiatric: He has a normal mood and affect. His behavior is normal.  Nursing note and vitals reviewed.     Assessment & Plan:   Problem List Items Addressed This Visit    None    Visit Diagnoses    Cough    -  Primary    Relevant Medications    oseltamivir (TAMIFLU) 75 MG capsule    fluticasone (FLONASE) 50 MCG/ACT nasal spray    Other Relevant Orders    Veritor Flu A/B Waived    Influenza with respiratory manifestation        Relevant Medications    oseltamivir (TAMIFLU) 75 MG capsule    fluticasone (FLONASE) 50 MCG/ACT nasal spray       Follow up plan: Return if symptoms worsen or fail to improve.  Counseling provided for all of the vaccine components Orders Placed This Encounter  Procedures  . Veritor Flu A/B Ulyses Southward, MD [facility] 08/25/2015, 11:30 AM

## 2015-08-28 ENCOUNTER — Ambulatory Visit: Payer: Medicare Other | Admitting: Orthopaedic Surgery

## 2015-09-04 ENCOUNTER — Other Ambulatory Visit: Payer: Self-pay | Admitting: Nurse Practitioner

## 2015-09-04 NOTE — Telephone Encounter (Signed)
.  last

## 2015-09-04 NOTE — Telephone Encounter (Signed)
Please call in valium with 1 refills 

## 2015-09-05 NOTE — Telephone Encounter (Signed)
rx called into pharmacy

## 2015-09-06 ENCOUNTER — Other Ambulatory Visit: Payer: Self-pay | Admitting: Nurse Practitioner

## 2015-09-20 ENCOUNTER — Ambulatory Visit (INDEPENDENT_AMBULATORY_CARE_PROVIDER_SITE_OTHER): Payer: Medicare Other | Admitting: Orthopaedic Surgery

## 2015-09-20 VITALS — BP 146/90 | HR 60 | Temp 98.1°F | Ht 68.0 in | Wt 224.4 lb

## 2015-09-20 DIAGNOSIS — M25511 Pain in right shoulder: Secondary | ICD-10-CM | POA: Diagnosis not present

## 2015-09-20 DIAGNOSIS — M7711 Lateral epicondylitis, right elbow: Secondary | ICD-10-CM

## 2015-09-20 MED ORDER — HYDROCODONE-ACETAMINOPHEN 7.5-325 MG PO TABS: 1.0000 | ORAL_TABLET | ORAL | Status: DC | PRN

## 2015-09-20 NOTE — Progress Notes (Signed)
Patient Andre Hunt, male DOB:March 15, 1961, 55 y.o. [id number]  Chief Complaint  Patient presents with  . Pain    HPI  Andre Hunt is a 55 y.o. male who has chronic right tennis elbow and right shoulder pain.  The weather recently has made him worse.  He has no new trauma.  He has no redness or numbness.  He has used ice and heat and his medicine.  He is doing his exercises.  He has no other problems.  HPI  Body mass index is 34.13 kg/(m^2).   Review of Systems  HENT: Negative for congestion.   Respiratory: Negative for cough and shortness of breath.   Cardiovascular: Negative for chest pain and leg swelling.  Endocrine: Positive for cold intolerance.  Musculoskeletal: Positive for joint swelling and arthralgias.  Allergic/Immunologic: Positive for environmental allergies.  Neurological: Negative for numbness.    Past Medical History  Diagnosis Date  . Gout   . Hyperlipidemia   . Allergy   . Personal history of colonic adenoma 09/28/2012    Past Surgical History  Procedure Laterality Date  . Exploratory laparotomy  1996    after falling on scissors; repair of "hole in intestines"  . Carpal tunnel release      bilateral 2000 and 2001  . Umbilical hernia repair  1968  . Incisional hernia repair  1997  . Elbow surgery  2006    muscle repair  . Appendectomy  1988  . Colonoscopy      Family History  Problem Relation Age of Onset  . Colon cancer Neg Hx   . Kidney cancer Mother   . Diabetes Mother   . COPD Mother   . Anuerysm Father   . Diabetes Father   . Heart disease Father   . Diabetes Sister     Social History Social History  Substance Use Topics  . Smoking status: Never Smoker   . Smokeless tobacco: Current User    Types: Snuff  . Alcohol Use: No    Allergies  Allergen Reactions  . Iohexol Anaphylaxis    Allergy to IVP dye    Current Outpatient Prescriptions  Medication Sig Dispense Refill  . atorvastatin (LIPITOR) 40 MG tablet Take  1 tablet (40 mg total) by mouth daily. 90 tablet 1  . cetirizine (ZYRTEC) 10 MG tablet TAKE 1 TABLET DAILY (Patient taking differently: TAKE 1 TABLET AS NEEDED) 30 tablet 0  . diazepam (VALIUM) 5 MG tablet TAKE 1 TABLET EVERY 12 HOURS AS NEEDED FOR ANXIETY 30 tablet 0  . HYDROcodone-acetaminophen (NORCO) 7.5-325 MG tablet Take 1 tablet by mouth every 4 (four) hours as needed for moderate pain (Must last 30 days.  Do not drive or operate machinery while taking this medicine.). 120 tablet 0  . allopurinol (ZYLOPRIM) 300 MG tablet Take 1 tablet (300 mg total) by mouth daily. (Patient not taking: Reported on 09/20/2015) 30 tablet 5  . colchicine 0.6 MG tablet One tid po for five days. (Patient not taking: Reported on 09/20/2015) 15 tablet 5  . fluticasone (FLONASE) 50 MCG/ACT nasal spray Place 1 spray into both nostrils 2 (two) times daily as needed for allergies or rhinitis. (Patient not taking: Reported on 09/20/2015) 16 g 6  . lisinopril (PRINIVIL,ZESTRIL) 20 MG tablet 1/2 QOD 90 tablet 1   No current facility-administered medications for this visit.     Physical Exam  Blood pressure 146/90, pulse 60, temperature 98.1 F (36.7 C), temperature source Tympanic, height 5\' 8"  (1.727 m), weight  224 lb 6.4 oz (101.787 kg).  Constitutional: overall normal hygiene, normal nutrition, well developed, normal grooming, normal body habitus. Assistive device:none  Musculoskeletal: gait and station Limp none, muscle tone and strength are normal, no tremors or atrophy is present.  .  Neurological: coordination overall normal.  Deep tendon reflex/nerve stretch intact.  Sensation normal.  Cranial nerves II-XII intact.   Skin:   Scar right elbow, otherwise overall no scars, lesions, ulcers or rashes. No psoriasis.  Psychiatric: Alert and oriented x 3.  Recent memory intact, remote memory unclear.  Normal mood and affect. Well groomed.  Good eye contact.  Cardiovascular: overall no swelling, no varicosities, no  edema bilaterally, normal temperatures of the legs and arms, no clubbing, cyanosis and good capillary refill.  Lymphatic: palpation is normal.  Examination of right Upper Extremity is done.  Inspection:   Overall:  Elbow non-tender without crepitus or defects, forearm non-tender without crepitus or defects, wrist non-tender without crepitus or defects, hand non-tender.    Shoulder: with glenohumeral joint tenderness, without effusion.   Upper arm: without swelling and tenderness   Range of motion:   Overall:  Full range of motion of the elbow, full range of motion of wrist and full range of motion in fingers.   Shoulder:  right  180 degrees forward flexion; 160 degrees abduction; 35 degrees internal rotation, 35 degrees external rotation, 20 degrees extension, 40 degrees adduction.   Stability:   Overall:  Shoulder, elbow and wrist stable   Strength and Tone:   Overall full shoulder muscles strength, full upper arm strength and normal upper arm bulk and tone.  The patient has been educated about the nature of the problem(s) and counseled on treatment options.  The patient appeared to understand what I have discussed and is in agreement with it.  Encounter Diagnoses  Name Primary?  . Right shoulder pain Yes  . Lateral epicondylitis, right     PLAN Call if any problems.  Precautions discussed.  Continue current medications.   Return to clinic 6 weeks

## 2015-11-01 ENCOUNTER — Ambulatory Visit: Payer: Medicare Other | Admitting: Orthopaedic Surgery

## 2015-11-08 ENCOUNTER — Ambulatory Visit (INDEPENDENT_AMBULATORY_CARE_PROVIDER_SITE_OTHER): Payer: Medicare Other | Admitting: Orthopaedic Surgery

## 2015-11-08 ENCOUNTER — Encounter: Payer: Self-pay | Admitting: Orthopaedic Surgery

## 2015-11-08 VITALS — BP 139/82 | HR 52 | Temp 98.4°F | Ht 68.0 in | Wt 224.0 lb

## 2015-11-08 DIAGNOSIS — M25511 Pain in right shoulder: Secondary | ICD-10-CM

## 2015-11-08 DIAGNOSIS — M7711 Lateral epicondylitis, right elbow: Secondary | ICD-10-CM | POA: Diagnosis not present

## 2015-11-08 DIAGNOSIS — I10 Essential (primary) hypertension: Secondary | ICD-10-CM

## 2015-11-08 MED ORDER — HYDROCODONE-ACETAMINOPHEN 7.5-325 MG PO TABS: 1.0000 | ORAL_TABLET | ORAL | Status: DC | PRN

## 2015-11-08 NOTE — Progress Notes (Signed)
Patient [id number] Andre Hunt, male DOB:February 10, 1961, 55 y.o. [id number]  Chief Complaint  Patient presents with  . Follow-up    Right shoulder pain    HPI  Andre Hunt is a 55 y.o. male who has chronic right shoulder pain and chronic tennis elbow pain.  He has had more pain with the long rainy and cold days we have had this last week.  He has no new trauma, no redness, no paresthesias.  He is taking his medicine and doing his exercises.  HPI  Body mass index is 34.07 kg/(m^2).  ROS  Review of Systems  HENT: Negative for congestion.   Respiratory: Negative for cough and shortness of breath.   Cardiovascular: Negative for chest pain and leg swelling.  Endocrine: Positive for cold intolerance.  Musculoskeletal: Positive for joint swelling and arthralgias.  Allergic/Immunologic: Positive for environmental allergies.  Neurological: Negative for numbness.    Past Medical History  Diagnosis Date  . Gout   . Hyperlipidemia   . Allergy   . Personal history of colonic adenoma 09/28/2012    Past Surgical History  Procedure Laterality Date  . Exploratory laparotomy  1996    after falling on scissors; repair of "hole in intestines"  . Carpal tunnel release      bilateral 2000 and 2001  . Umbilical hernia repair  1968  . Incisional hernia repair  1997  . Elbow surgery  2006    muscle repair  . Appendectomy  1988  . Colonoscopy      Family History  Problem Relation Age of Onset  . Colon cancer Neg Hx   . Kidney cancer Mother   . Diabetes Mother   . COPD Mother   . Anuerysm Father   . Diabetes Father   . Heart disease Father   . Diabetes Sister     Social History Social History  Substance Use Topics  . Smoking status: Never Smoker   . Smokeless tobacco: Current User    Types: Snuff  . Alcohol Use: No    Allergies  Allergen Reactions  . Iohexol Anaphylaxis    Allergy to IVP dye    Current Outpatient Prescriptions  Medication Sig Dispense Refill  .  allopurinol (ZYLOPRIM) 300 MG tablet Take 1 tablet (300 mg total) by mouth daily. 30 tablet 5  . atorvastatin (LIPITOR) 40 MG tablet Take 1 tablet (40 mg total) by mouth daily. 90 tablet 1  . cetirizine (ZYRTEC) 10 MG tablet TAKE 1 TABLET DAILY (Patient taking differently: TAKE 1 TABLET AS NEEDED) 30 tablet 0  . colchicine 0.6 MG tablet One tid po for five days. 15 tablet 5  . diazepam (VALIUM) 5 MG tablet TAKE 1 TABLET EVERY 12 HOURS AS NEEDED FOR ANXIETY 30 tablet 0  . fluticasone (FLONASE) 50 MCG/ACT nasal spray Place 1 spray into both nostrils 2 (two) times daily as needed for allergies or rhinitis. 16 g 6  . HYDROcodone-acetaminophen (NORCO) 7.5-325 MG tablet Take 1 tablet by mouth every 4 (four) hours as needed for moderate pain (Must last 30 days.  Do not drive or operate machinery while taking this medicine.). 120 tablet 0  . lisinopril (PRINIVIL,ZESTRIL) 20 MG tablet 1/2 QOD 90 tablet 1   No current facility-administered medications for this visit.     Physical Exam  Blood pressure 139/82, pulse 52, temperature 98.4 F (36.9 C), height 5\' 8"  (1.727 m), weight 224 lb (101.606 kg).  Constitutional: overall normal hygiene, normal nutrition, well developed, normal grooming, normal body  habitus. Assistive device:none  Musculoskeletal: gait and station Limp none, muscle tone and strength are normal, no tremors or atrophy is present.  .  Neurological: coordination overall normal.  Deep tendon reflex/nerve stretch intact.  Sensation normal.  Cranial nerves II-XII intact.   Skin:   normal overall no scars, lesions, ulcers or rashes. No psoriasis.  Psychiatric: Alert and oriented x 3.  Recent memory intact, remote memory unclear.  Normal mood and affect. Well groomed.  Good eye contact.  Cardiovascular: overall no swelling, no varicosities, no edema bilaterally, normal temperatures of the legs and arms, no clubbing, cyanosis and good capillary refill.  Lymphatic: palpation is  normal.  Examination of right Upper Extremity is done.  Inspection:   Overall:  Elbow non-tender without crepitus or defects, forearm non-tender without crepitus or defects, wrist non-tender without crepitus or defects, hand non-tender.    Shoulder: with glenohumeral joint tenderness, without effusion.   Upper arm: without swelling and tenderness   Range of motion:   Overall:  Full range of motion of the elbow, full range of motion of wrist and full range of motion in fingers.   Shoulder:  right  160 degrees forward flexion; 150 degrees abduction; 40 degrees internal rotation, 40 degrees external rotation, 20 degrees extension, 40 degrees adduction.   Stability:   Overall:  Shoulder, elbow and wrist stable   Strength and Tone:   Overall full shoulder muscles strength, full upper arm strength and normal upper arm bulk and tone.  He has tenderness over the lateral epicondyle of the right elbow with pain to resisted extension.  The patient has been educated about the nature of the problem(s) and counseled on treatment options.  The patient appeared to understand what I have discussed and is in agreement with it.  Encounter Diagnoses  Name Primary?  . Right shoulder pain Yes  . Lateral epicondylitis, right   . Essential hypertension     PLAN Call if any problems.  Precautions discussed.  Continue current medications.   Return to clinic 6 weeks

## 2015-11-13 ENCOUNTER — Encounter: Payer: Self-pay | Admitting: Internal Medicine

## 2015-11-14 ENCOUNTER — Other Ambulatory Visit: Payer: Self-pay | Admitting: Nurse Practitioner

## 2015-11-14 NOTE — Telephone Encounter (Signed)
Last seen 08/25/15  Dr Dettinger MMM PCP  If approved route to nurse to call  [place]

## 2015-11-15 NOTE — Telephone Encounter (Signed)
Please call in valium with 1 refills

## 2015-12-12 ENCOUNTER — Telehealth: Payer: Self-pay | Admitting: Orthopaedic Surgery

## 2015-12-12 MED ORDER — HYDROCODONE-ACETAMINOPHEN 7.5-325 MG PO TABS: 1.0000 | ORAL_TABLET | ORAL | Status: DC | PRN

## 2015-12-12 NOTE — Telephone Encounter (Signed)
Patient called and requested a refill on Hydrocodone-Acetaminophen 7.5-325 mgs.  Qty  120   Sig: Take 1 tablet by mouth every 4 (four) hours as needed for moderate pain (Must last 30 days. Do not drive or operate machinery while taking this medicine.).

## 2015-12-12 NOTE — Telephone Encounter (Signed)
Rx Done . 

## 2015-12-20 ENCOUNTER — Ambulatory Visit (INDEPENDENT_AMBULATORY_CARE_PROVIDER_SITE_OTHER): Payer: Medicare Other | Admitting: Orthopaedic Surgery

## 2015-12-20 ENCOUNTER — Encounter: Payer: Self-pay | Admitting: Orthopaedic Surgery

## 2015-12-20 VITALS — BP 127/85 | HR 61 | Temp 98.2°F | Ht 67.5 in | Wt 218.0 lb

## 2015-12-20 DIAGNOSIS — M25511 Pain in right shoulder: Secondary | ICD-10-CM | POA: Diagnosis not present

## 2015-12-20 DIAGNOSIS — M7711 Lateral epicondylitis, right elbow: Secondary | ICD-10-CM | POA: Diagnosis not present

## 2015-12-20 DIAGNOSIS — I10 Essential (primary) hypertension: Secondary | ICD-10-CM | POA: Diagnosis not present

## 2015-12-20 NOTE — Progress Notes (Signed)
Patient [id number] Andre Hunt, male DOB:[birth year], 55 y.o. [id number]  Chief Complaint  Patient presents with  . Follow-up    Left shoulder    HPI  Andre Hunt is a 55 y.o. male who has chronic pain in the left shoulder and elbow.  He has no new trauma. He has no redness, no paresthesias.  He has been doing his exercises and taking his medicine.  HPI  Body mass index is 33.62 kg/(m^2).  ROS  Review of Systems  Past Medical History  Diagnosis Date  . Gout   . Hyperlipidemia   . Allergy   . Personal history of colonic adenoma 09/28/2012    Past Surgical History  Procedure Laterality Date  . Exploratory laparotomy  1996    after falling on scissors; repair of "hole in intestines"  . Carpal tunnel release      bilateral 2000 and 2001  . Umbilical hernia repair  1968  . Incisional hernia repair  1997  . Elbow surgery  2006    muscle repair  . Appendectomy  1988  . Colonoscopy      Family History  Problem Relation Age of Onset  . Colon cancer Neg Hx   . Kidney cancer Mother   . Diabetes Mother   . COPD Mother   . Anuerysm Father   . Diabetes Father   . Heart disease Father   . Diabetes Sister     Social History Social History  Substance Use Topics  . Smoking status: Never Smoker   . Smokeless tobacco: Current User    Types: Snuff  . Alcohol Use: No    Allergies  Allergen Reactions  . Iohexol Anaphylaxis    Allergy to IVP dye    Current Outpatient Prescriptions  Medication Sig Dispense Refill  . allopurinol (ZYLOPRIM) 300 MG tablet Take 1 tablet (300 mg total) by mouth daily. 30 tablet 5  . atorvastatin (LIPITOR) 40 MG tablet Take 1 tablet (40 mg total) by mouth daily. 90 tablet 1  . cetirizine (ZYRTEC) 10 MG tablet TAKE 1 TABLET DAILY (Patient taking differently: TAKE 1 TABLET AS NEEDED) 30 tablet 0  . colchicine 0.6 MG tablet One tid po for five days. 15 tablet 5  . diazepam (VALIUM) 5 MG tablet TAKE 1 TABLET EVERY 12 HOURS AS NEEDED FOR ANXIETY  30 tablet 1  . fluticasone (FLONASE) 50 MCG/ACT nasal spray Place 1 spray into both nostrils 2 (two) times daily as needed for allergies or rhinitis. 16 g 6  . HYDROcodone-acetaminophen (NORCO) 7.5-325 MG tablet Take 1 tablet by mouth every 4 (four) hours as needed for moderate pain (Must last 30 days.  Do not drive or operate machinery while taking this medicine.). 120 tablet 0  . lisinopril (PRINIVIL,ZESTRIL) 20 MG tablet 1/2 QOD 90 tablet 1   No current facility-administered medications for this visit.     Physical Exam  Blood pressure 127/85, pulse 61, temperature 98.2 F (36.8 C), height 5' 7.5" (1.715 m), weight 218 lb (98.884 kg).  Constitutional: overall normal hygiene, normal nutrition, well developed, normal grooming, normal body habitus. Assistive device:none  Musculoskeletal: gait and station Limp none, muscle tone and strength are normal, no tremors or atrophy is present.  .  Neurological: coordination overall normal.  Deep tendon reflex/nerve stretch intact.  Sensation normal.  Cranial nerves II-XII intact.   Skin:   normal overall no scars, lesions, ulcers or rashes. No psoriasis.  Psychiatric: Alert and oriented x 3.  Recent memory intact, remote  memory unclear.  Normal mood and affect. Well groomed.  Good eye contact.  Cardiovascular: overall no swelling, no varicosities, no edema bilaterally, normal temperatures of the legs and arms, no clubbing, cyanosis and good capillary refill.  Lymphatic: palpation is normal.  Both elbows are tender over lateral epicondyle but have full motion.  Examination of right Upper Extremity is done.  Inspection:   Overall:  Elbow non-tender without crepitus or defects, forearm non-tender without crepitus or defects, wrist non-tender without crepitus or defects, hand non-tender.    Shoulder: with glenohumeral joint tenderness, without effusion.   Upper arm: without swelling and tenderness   Range of motion:   Overall:  Full range of  motion of the elbow, full range of motion of wrist and full range of motion in fingers.   Shoulder:  right  160 degrees forward flexion; 145 degrees abduction; 30 degrees internal rotation, 30 degrees external rotation, 20 degrees extension, full degrees adduction.   Stability:   Overall:  Shoulder, elbow and wrist stable   Strength and Tone:   Overall full shoulder muscles strength, full upper arm strength and normal upper arm bulk and tone.   The patient has been educated about the nature of the problem(s) and counseled on treatment options.  The patient appeared to understand what I have discussed and is in agreement with it.  Encounter Diagnoses  Name Primary?  . Right shoulder pain Yes  . Lateral epicondylitis, right   . Essential hypertension     PLAN Call if any problems.  Precautions discussed.  Continue current medications.   Return to clinic 6 weeks   Electronically Signed Sanjuana Kava, MD 7/6/201710:49 AM

## 2016-01-03 ENCOUNTER — Other Ambulatory Visit: Payer: Self-pay | Admitting: Nurse Practitioner

## 2016-01-03 NOTE — Telephone Encounter (Signed)
Last refill without being seen 

## 2016-01-03 NOTE — Telephone Encounter (Signed)
Last seen 08/25/15  Dr Dettinger   MMM PCP   Last lipid 12/13/14

## 2016-01-03 NOTE — Telephone Encounter (Signed)
Left detailed message that we did refill this time but will ntbs prior to any further refills and to call to schedule his follow up appt.

## 2016-01-21 ENCOUNTER — Other Ambulatory Visit: Payer: Self-pay | Admitting: Nurse Practitioner

## 2016-01-21 ENCOUNTER — Telehealth: Payer: Self-pay | Admitting: Orthopaedic Surgery

## 2016-01-21 NOTE — Telephone Encounter (Signed)
Last seen 11/2014. Route to pool

## 2016-01-21 NOTE — Telephone Encounter (Signed)
Please call in valium with 1 refills

## 2016-01-21 NOTE — Telephone Encounter (Signed)
Hydrocodone-Acetaminophen 7.5/325mg Qty 120 Tablets °

## 2016-01-22 MED ORDER — HYDROCODONE-ACETAMINOPHEN 7.5-325 MG PO TABS: 1.0000 | ORAL_TABLET | ORAL | 0 refills | Status: DC | PRN

## 2016-01-31 ENCOUNTER — Ambulatory Visit (INDEPENDENT_AMBULATORY_CARE_PROVIDER_SITE_OTHER): Payer: Medicare Other | Admitting: Orthopaedic Surgery

## 2016-01-31 ENCOUNTER — Encounter: Payer: Self-pay | Admitting: Orthopaedic Surgery

## 2016-01-31 VITALS — BP 111/66 | HR 76 | Temp 98.4°F | Ht 67.5 in | Wt 217.0 lb

## 2016-01-31 DIAGNOSIS — M25511 Pain in right shoulder: Secondary | ICD-10-CM | POA: Diagnosis not present

## 2016-01-31 DIAGNOSIS — I10 Essential (primary) hypertension: Secondary | ICD-10-CM

## 2016-01-31 NOTE — Progress Notes (Signed)
CC:  My shoulder hurts  His right shoulder is more painful.  He has no new trauma or swelling.  He has no numbness.  He has pain with overhead use.  NV intact.  ROM is full but tender.  He has pain to resisted abduction.  Encounter Diagnoses  Name Primary?  . Right shoulder pain Yes  . Essential hypertension    PROCEDURE NOTE:  The patient request injection, verbal consent was obtained.  The right shoulder was prepped appropriately after time out was performed.   Sterile technique was observed and injection of 1 cc of Depo-Medrol 40 mg with several cc's of plain xylocaine. Anesthesia was provided by ethyl chloride and a 20-gauge needle was used to inject the shoulder area. A posterior approach was used.  The injection was tolerated well.  A band aid dressing was applied.  The patient was advised to apply ice later today and tomorrow to the injection sight as needed.  Return in six weeks.  Call if any problem.  Precautions discussed.  Electronically Signed Sanjuana Kava, MD 8/17/20171:34 PM

## 2016-02-01 ENCOUNTER — Telehealth: Payer: Self-pay | Admitting: Nurse Practitioner

## 2016-02-01 MED ORDER — CETIRIZINE HCL 10 MG PO TABS: 10.0000 mg | ORAL_TABLET | Freq: Every day | ORAL | 0 refills | Status: DC

## 2016-02-01 NOTE — Telephone Encounter (Signed)
done

## 2016-02-19 ENCOUNTER — Telehealth: Payer: Self-pay | Admitting: Orthopaedic Surgery

## 2016-02-19 MED ORDER — HYDROCODONE-ACETAMINOPHEN 7.5-325 MG PO TABS: 1.0000 | ORAL_TABLET | ORAL | 0 refills | Status: DC | PRN

## 2016-02-19 NOTE — Telephone Encounter (Signed)
Hydrocodone-Acetaminophen 7.5/325mg Qty 120 Tablets °

## 2016-02-29 ENCOUNTER — Other Ambulatory Visit: Payer: Self-pay | Admitting: Nurse Practitioner

## 2016-03-11 ENCOUNTER — Ambulatory Visit (INDEPENDENT_AMBULATORY_CARE_PROVIDER_SITE_OTHER): Payer: Medicare Other | Admitting: Nurse Practitioner

## 2016-03-11 ENCOUNTER — Encounter: Payer: Self-pay | Admitting: Nurse Practitioner

## 2016-03-11 VITALS — BP 110/75 | HR 58 | Temp 96.9°F | Ht 67.0 in | Wt 221.0 lb

## 2016-03-11 DIAGNOSIS — M1A00X0 Idiopathic chronic gout, unspecified site, without tophus (tophi): Secondary | ICD-10-CM

## 2016-03-11 DIAGNOSIS — I10 Essential (primary) hypertension: Secondary | ICD-10-CM | POA: Diagnosis not present

## 2016-03-11 DIAGNOSIS — Z6834 Body mass index (BMI) 34.0-34.9, adult: Secondary | ICD-10-CM | POA: Diagnosis not present

## 2016-03-11 DIAGNOSIS — M25511 Pain in right shoulder: Secondary | ICD-10-CM

## 2016-03-11 DIAGNOSIS — Z86010 Personal history of colonic polyps: Secondary | ICD-10-CM

## 2016-03-11 DIAGNOSIS — E785 Hyperlipidemia, unspecified: Secondary | ICD-10-CM | POA: Diagnosis not present

## 2016-03-11 DIAGNOSIS — R151 Fecal smearing: Secondary | ICD-10-CM

## 2016-03-11 DIAGNOSIS — Z1159 Encounter for screening for other viral diseases: Secondary | ICD-10-CM | POA: Diagnosis not present

## 2016-03-11 DIAGNOSIS — R6889 Other general symptoms and signs: Secondary | ICD-10-CM | POA: Diagnosis not present

## 2016-03-11 DIAGNOSIS — R899 Unspecified abnormal finding in specimens from other organs, systems and tissues: Secondary | ICD-10-CM | POA: Diagnosis not present

## 2016-03-11 MED ORDER — ATORVASTATIN CALCIUM 40 MG PO TABS: ORAL_TABLET | ORAL | 0 refills | Status: DC

## 2016-03-11 MED ORDER — COLCHICINE 0.6 MG PO TABS: ORAL_TABLET | ORAL | 5 refills | Status: DC

## 2016-03-11 MED ORDER — ALLOPURINOL 300 MG PO TABS: 300.0000 mg | ORAL_TABLET | Freq: Every day | ORAL | 5 refills | Status: DC

## 2016-03-11 MED ORDER — DIAZEPAM 5 MG PO TABS: ORAL_TABLET | ORAL | 2 refills | Status: DC

## 2016-03-11 MED ORDER — DIAZEPAM 5 MG PO TABS: ORAL_TABLET | ORAL | 0 refills | Status: DC

## 2016-03-11 MED ORDER — LISINOPRIL 20 MG PO TABS: ORAL_TABLET | ORAL | 1 refills | Status: DC

## 2016-03-11 NOTE — Patient Instructions (Signed)

## 2016-03-11 NOTE — Progress Notes (Signed)
Subjective:    Patient ID: Andre Hunt, male    DOB: July 04, 1960, 55 y.o.   MRN: [id number]   Patient here today for follow up of chronic medical problems. No chnages since llast visit.  Hyperlipidemia  This is a chronic problem. The current episode started more than 1 year ago. The problem is uncontrolled. Recent lipid tests were reviewed and are variable. Exacerbating diseases include obesity. He has no history of diabetes or hypothyroidism. Current antihyperlipidemic treatment includes statins. The current treatment provides moderate improvement of lipids. Compliance problems include adherence to diet and adherence to exercise.  Risk factors for coronary artery disease include dyslipidemia, male sex and obesity.  Hypertension  This is a chronic problem. The current episode started more than 1 year ago. The problem has been waxing and waning since onset. Risk factors for coronary artery disease include dyslipidemia, male gender and sedentary lifestyle. Past treatments include ACE inhibitors. The current treatment provides moderate improvement. Compliance problems include diet and exercise.  There is no history of CAD/MI, CVA or heart failure.  GOUT On allopurinol- last flare up was over a year ago. Patient said he does not take the medicine as prescribed, he only takes it when he needs it. He is controlling the disease with diet. insomnia Takes valium at night- helps him to relax Right shoulder pain Is seeing ortho- takes valium at night to help him sleep due to shoulder pain.     Review of Systems  Constitutional: Negative.   HENT: Negative.   Respiratory: Negative.   Cardiovascular: Negative.   Genitourinary: Negative.   Neurological: Negative.   Psychiatric/Behavioral: Negative.   All other systems reviewed and are negative.      Objective:   Physical Exam  Constitutional: He is oriented to person, place, and time. He appears well-developed and well-nourished.  HENT:    Head: Normocephalic.  Right Ear: External ear normal.  Left Ear: External ear normal.  Nose: Nose normal.  Mouth/Throat: Oropharynx is clear and moist.  Eyes: EOM are normal. Pupils are equal, round, and reactive to light.  Neck: Normal range of motion. Neck supple. No JVD present. No thyromegaly present.  Cardiovascular: Normal rate, regular rhythm and intact distal pulses.  Exam reveals gallop and S3. Exam reveals no friction rub.   No murmur heard. Pulmonary/Chest: Effort normal and breath sounds normal. No respiratory distress. He has no wheezes. He has no rales. He exhibits no tenderness.  Abdominal: Soft. Bowel sounds are normal. He exhibits no mass. There is no tenderness.  Genitourinary: Prostate normal and penis normal.  Musculoskeletal: Normal range of motion. He exhibits no edema.  Lymphadenopathy:    He has no cervical adenopathy.  Neurological: He is alert and oriented to person, place, and time. No cranial nerve deficit.  Skin: Skin is warm and dry.  Psychiatric: He has a normal mood and affect. His behavior is normal. Judgment and thought content normal.    BP 110/75   Pulse (!) 58   Temp (!) 96.9 F (36.1 C) (Oral)   Ht _0  (1.702 m)   Wt 221 lb (100.2 kg)   BMI 34.61 kg/m       Assessment & Plan:  1. Right shoulder pain Keep appointments with ortho - diazepam (VALIUM) 5 MG tablet; TAKE 1 TABLET EVERY 12 HOURS AS NEEDED FOR ANXIETY  Dispense: 30 tablet; Refill: 0  2. Hyperlipidemia with target LDL less than 100 Low fat diet - atorvastatin (LIPITOR) 40 MG tablet; Take  1 tablet (40 mg total) by mouth daily.  Dispense: 90 tablet; Refill: 0 - Lipid panel  3. 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  Dispense: 30 tablet; Refill: 5 - colchicine 0.6 MG tablet; One tid po for five days.  Dispense: 15 tablet; Refill: 5  4. Fecal soiling Is  going to schedule colonoscopy- got letter in mail from GI that it was time  5. Personal history of colonic adenoma - Anemia Profile B  6. Essential hypertension Low salt diet - lisinopril (PRINIVIL,ZESTRIL) 20 MG tablet; 1/2 QOD  Dispense: 90 tablet; Refill: 1 - CMP14+EGFR  7. Need for hepatitis C screening test - Hepatitis C antibody  8. BMI 34.0-34.9. adult   Weight loss and AHA exercise recommendation discussed   Labs pending Health maintenance reviewed Diet and exercise encouraged Continue all meds Follow up  in 6 months   [place], FNP

## 2016-03-12 LAB — LIPID PANEL
CHOL/HDL RATIO: 2.5 ratio (ref 0.0–5.0)
Cholesterol, Total: 117 mg/dL (ref 100–199)
HDL: 46 mg/dL (ref >39)
LDL Calculated: 55 mg/dL (ref 0–99)
Triglycerides: 78 mg/dL (ref 0–149)
VLDL Cholesterol Cal: 16 mg/dL (ref 5–40)

## 2016-03-12 LAB — CMP14+EGFR
ALBUMIN: 4.4 g/dL (ref 3.5–5.5)
ALT: 41 IU/L (ref 0–44)
AST: 26 IU/L (ref 0–40)
Albumin/Globulin Ratio: 1.8 (ref 1.2–2.2)
Alkaline Phosphatase: 61 IU/L (ref 39–117)
BUN/Creatinine Ratio: 16 (ref 9–20)
BUN: 17 mg/dL (ref 6–24)
Bilirubin Total: 0.5 mg/dL (ref 0.0–1.2)
CALCIUM: 9.3 mg/dL (ref 8.7–10.2)
CHLORIDE: 101 mmol/L (ref 96–106)
CO2: 27 mmol/L (ref 18–29)
CREATININE: 1.07 mg/dL (ref 0.76–1.27)
GFR, EST AFRICAN AMERICAN: 90 mL/min/{1.73_m2} (ref >59)
GFR, EST NON AFRICAN AMERICAN: 78 mL/min/{1.73_m2} (ref >59)
GLUCOSE: 96 mg/dL (ref 65–99)
Globulin, Total: 2.5 g/dL (ref 1.5–4.5)
Potassium: 5.2 mmol/L (ref 3.5–5.2)
Sodium: 142 mmol/L (ref 134–144)
TOTAL PROTEIN: 6.9 g/dL (ref 6.0–8.5)

## 2016-03-12 LAB — ANEMIA PROFILE B
BASOS ABS: 0 10*3/uL (ref 0.0–0.2)
BASOS: 1 %
EOS (ABSOLUTE): 0.1 10*3/uL (ref 0.0–0.4)
Eos: 1 %
FERRITIN: 433 ng/mL — AB (ref 30–400)
Folate: 9.4 ng/mL (ref >3.0)
HEMATOCRIT: 43.3 % (ref 37.5–51.0)
Hemoglobin: 14.8 g/dL (ref 12.6–17.7)
IRON: 122 ug/dL (ref 38–169)
Immature Grans (Abs): 0 10*3/uL (ref 0.0–0.1)
Immature Granulocytes: 0 %
Iron Saturation: 40 % (ref 15–55)
Lymphocytes Absolute: 2.2 10*3/uL (ref 0.7–3.1)
Lymphs: 34 %
MCH: 30.7 pg (ref 26.6–33.0)
MCHC: 34.2 g/dL (ref 31.5–35.7)
MCV: 90 fL (ref 79–97)
MONOCYTES: 9 %
Monocytes Absolute: 0.6 10*3/uL (ref 0.1–0.9)
NEUTROS ABS: 3.5 10*3/uL (ref 1.4–7.0)
Neutrophils: 55 %
PLATELETS: 238 10*3/uL (ref 150–379)
RBC: 4.82 x10E6/uL (ref 4.14–5.80)
RDW: 13.6 % (ref 12.3–15.4)
Retic Ct Pct: 1.6 % (ref 0.6–2.6)
TIBC: 302 ug/dL (ref 250–450)
UIBC: 180 ug/dL (ref 111–343)
Vitamin B-12: 245 pg/mL (ref 211–946)
WBC: 6.4 10*3/uL (ref 3.4–10.8)

## 2016-03-12 LAB — HEPATITIS C ANTIBODY

## 2016-03-27 ENCOUNTER — Encounter: Payer: Self-pay | Admitting: Orthopaedic Surgery

## 2016-03-27 ENCOUNTER — Ambulatory Visit (INDEPENDENT_AMBULATORY_CARE_PROVIDER_SITE_OTHER): Payer: Medicare Other | Admitting: Orthopaedic Surgery

## 2016-03-27 VITALS — BP 162/81 | HR 60 | Temp 98.1°F | Ht 67.0 in | Wt 219.0 lb

## 2016-03-27 DIAGNOSIS — M25511 Pain in right shoulder: Secondary | ICD-10-CM | POA: Diagnosis not present

## 2016-03-27 DIAGNOSIS — I10 Essential (primary) hypertension: Secondary | ICD-10-CM

## 2016-03-27 DIAGNOSIS — G8929 Other chronic pain: Secondary | ICD-10-CM

## 2016-03-27 MED ORDER — HYDROCODONE-ACETAMINOPHEN 7.5-325 MG PO TABS: 1.0000 | ORAL_TABLET | ORAL | 0 refills | Status: DC | PRN

## 2016-03-27 NOTE — Progress Notes (Signed)
Patient Andre Hunt, male DOB:June 04, 1961, 55 y.o. [id number]  Chief Complaint  Patient presents with  . Follow-up    shoulder pain right    HPI  Andre Hunt is a 55 y.o. male who has chronic right shoulder pain and right chronic tennis elbow.  He has been stable.  He has more pain on rainy days.  He has no trauma, no paresthesias. HPI  Body mass index is 34.3 kg/m.  ROS  Review of Systems  HENT: Negative for congestion.   Respiratory: Negative for cough and shortness of breath.   Cardiovascular: Negative for chest pain and leg swelling.  Endocrine: Positive for cold intolerance.  Musculoskeletal: Positive for arthralgias and joint swelling.  Allergic/Immunologic: Positive for environmental allergies.  Neurological: Negative for numbness.    Past Medical History:  Diagnosis Date  . Allergy   . Gout   . Hyperlipidemia   . Personal history of colonic adenoma 09/28/2012    Past Surgical History:  Procedure Laterality Date  . APPENDECTOMY  1988  . CARPAL TUNNEL RELEASE     bilateral 2000 and 2001  . COLONOSCOPY    . ELBOW SURGERY  2006   muscle repair  . EXPLORATORY LAPAROTOMY  1996   after falling on scissors; repair of "hole in intestines"  . [place]  . UMBILICAL HERNIA REPAIR  1968    Family History  Problem Relation Age of Onset  . Colon cancer Neg Hx   . Kidney cancer Mother   . Diabetes Mother   . COPD Mother   . Anuerysm Father   . Diabetes Father   . Heart disease Father   . Diabetes Sister     Social History Social History  Substance Use Topics  . Smoking status: Never Smoker  . Smokeless tobacco: Current User    Types: Snuff  . Alcohol use No    Allergies  Allergen Reactions  . Iohexol Anaphylaxis    Allergy to IVP dye    Current Outpatient Prescriptions  Medication Sig Dispense Refill  . allopurinol (ZYLOPRIM) 300 MG tablet Take 1 tablet (300 mg total) by mouth daily. 30 tablet 5  . atorvastatin  (LIPITOR) 40 MG tablet Take 1 tablet (40 mg total) by mouth daily. 90 tablet 0  . cetirizine (ZYRTEC) 10 MG tablet Take 1 tablet (10 mg total) by mouth daily. 30 tablet 3  . colchicine 0.6 MG tablet One tid po for five days. 15 tablet 5  . diazepam (VALIUM) 5 MG tablet TAKE 1 TABLET EVERY 12 HOURS AS NEEDED FOR ANXIETY 30 tablet 2  . fluticasone (FLONASE) 50 MCG/ACT nasal spray Place 1 spray into both nostrils 2 (two) times daily as needed for allergies or rhinitis. 16 g 6  . HYDROcodone-acetaminophen (NORCO) 7.5-325 MG tablet Take 1 tablet by mouth every 4 (four) hours as needed for moderate pain (Must last 30 days.  Do not drive or operate machinery while taking this medicine.). 120 tablet 0  . lisinopril (PRINIVIL,ZESTRIL) 20 MG tablet 1/2 QOD 90 tablet 1   No current facility-administered medications for this visit.      Physical Exam  Blood pressure (!) 162/81, pulse 60, temperature 98.1 F (36.7 C), height 5\' 7"  (1.702 m), weight 219 lb (99.3 kg).  Constitutional: overall normal hygiene, normal nutrition, well developed, normal grooming, normal body habitus. Assistive device:none  Musculoskeletal: gait and station Limp none, muscle tone and strength are normal, no tremors or atrophy is present.  [place] Kitchen  Neurological: coordination overall normal.  Deep tendon reflex/nerve stretch intact.  Sensation normal.  Cranial nerves II-XII intact.   Skin:   Normal overall no scars, lesions, ulcers or rashes. No psoriasis.  Psychiatric: Alert and oriented x 3.  Recent memory intact, remote memory unclear.  Normal mood and affect. Well groomed.  Good eye contact.  Cardiovascular: overall no swelling, no varicosities, no edema bilaterally, normal temperatures of the legs and arms, no clubbing, cyanosis and good capillary refill.  Lymphatic: palpation is normal.  Examination of right Upper Extremity is done.  Inspection:   Overall:  Elbow non-tender without crepitus or defects, forearm non-tender  without crepitus or defects, wrist non-tender without crepitus or defects, hand non-tender.    Shoulder: with glenohumeral joint tenderness, without effusion.   Upper arm: without swelling and tenderness   Range of motion:   Overall:  Full range of motion of the elbow, full range of motion of wrist and full range of motion in fingers.   Shoulder:  right  165 degrees forward flexion; 145 degrees abduction; 35 degrees internal rotation, 5 degrees external rotation, 20 degrees extension, 40 degrees adduction.   Stability:   Overall:  Shoulder, elbow and wrist stable   Strength and Tone:   Overall full shoulder muscles strength, full upper arm strength and normal upper arm bulk and tone.   The patient has been educated about the nature of the problem(s) and counseled on treatment options.  The patient appeared to understand what I have discussed and is in agreement with it.  Encounter Diagnoses  Name Primary?  . Chronic right shoulder pain Yes  . Essential hypertension     PLAN Call if any problems.  Precautions discussed.  Continue current medications.   Return to clinic 2 months   Electronically Signed Sanjuana Kava, MD 10/12/20171:39 PM

## 2016-04-28 ENCOUNTER — Other Ambulatory Visit: Payer: Self-pay | Admitting: *Deleted

## 2016-04-28 ENCOUNTER — Telehealth: Payer: Self-pay | Admitting: Orthopedic Surgery

## 2016-04-28 MED ORDER — HYDROCODONE-ACETAMINOPHEN 7.5-325 MG PO TABS: 1.0000 | ORAL_TABLET | ORAL | 0 refills | Status: DC | PRN

## 2016-04-28 NOTE — Telephone Encounter (Signed)
Patient requests refill on Hydrocodone/Acetaminophen (Norco)  7.5-325 mgs.  Qty  120       Sig: Take 1 tablet by mouth every 4 (four) hours as needed for moderate pain (Must last 30 days. Do not drive or operate machinery while taking this medicine.).

## 2016-04-30 ENCOUNTER — Other Ambulatory Visit: Payer: Self-pay | Admitting: Nurse Practitioner

## 2016-04-30 DIAGNOSIS — I10 Essential (primary) hypertension: Secondary | ICD-10-CM

## 2016-05-01 ENCOUNTER — Other Ambulatory Visit: Payer: Self-pay | Admitting: *Deleted

## 2016-05-01 ENCOUNTER — Ambulatory Visit: Payer: Medicare Other | Admitting: Orthopaedic Surgery

## 2016-05-01 ENCOUNTER — Other Ambulatory Visit: Payer: Self-pay | Admitting: Nurse Practitioner

## 2016-05-01 MED ORDER — LISINOPRIL 10 MG PO TABS: 10.0000 mg | ORAL_TABLET | Freq: Every day | ORAL | 1 refills | Status: DC

## 2016-05-01 NOTE — Telephone Encounter (Signed)
Pt aware.

## 2016-05-01 NOTE — Telephone Encounter (Signed)
Fax request came in today after refill was done per protocol yesterday. Not on fax today says Patient requests 10 mg tablets - 20 mg tables ' wipes him out' so he takes it every other day. Please advise. [employer].

## 2016-05-01 NOTE — Telephone Encounter (Signed)
Will change rx to 10mg  daily- needs to keep check of blood pressure

## 2016-05-01 NOTE — Telephone Encounter (Signed)
Pt aware of change in blood pressure strength and to keep a check on his blood pressures

## 2016-05-28 ENCOUNTER — Encounter: Payer: Self-pay | Admitting: Orthopaedic Surgery

## 2016-05-28 ENCOUNTER — Ambulatory Visit (INDEPENDENT_AMBULATORY_CARE_PROVIDER_SITE_OTHER): Payer: Medicare Other | Admitting: Orthopaedic Surgery

## 2016-05-28 VITALS — BP 145/88 | HR 58 | Temp 97.9°F | Ht 67.0 in | Wt 220.0 lb

## 2016-05-28 DIAGNOSIS — G8929 Other chronic pain: Secondary | ICD-10-CM | POA: Diagnosis not present

## 2016-05-28 DIAGNOSIS — M25511 Pain in right shoulder: Secondary | ICD-10-CM

## 2016-05-28 DIAGNOSIS — I10 Essential (primary) hypertension: Secondary | ICD-10-CM

## 2016-05-28 MED ORDER — OXYCODONE-ACETAMINOPHEN 5-325 MG PO TABS: 1.0000 | ORAL_TABLET | ORAL | 0 refills | Status: DC | PRN

## 2016-05-28 MED ORDER — PREDNISONE 5 MG (21) PO TBPK: ORAL_TABLET | ORAL | 0 refills | Status: DC

## 2016-05-28 NOTE — Progress Notes (Signed)
Patient Andre Hunt:[id number] [name], male DOB:1960/11/01, 55 y.o. MQ:[id number]  Chief Complaint  Patient presents with  . Follow-up    right shoulder pain    HPI  Andre Hunt is a 55 y.o. male who has chronic right shoulder pain.  The injection last time only lasted about a week.  He has pain with overhead use and when sleeping.  He has no paresthesia or new trauma.  His pain is worse with the cold weather. HPI  Body mass index is 34.46 kg/m.  ROS  Review of Systems  HENT: Negative for congestion.   Respiratory: Negative for cough and shortness of breath.   Cardiovascular: Negative for chest pain and leg swelling.  Endocrine: Positive for cold intolerance.  Musculoskeletal: Positive for arthralgias and joint swelling.  Allergic/Immunologic: Positive for environmental allergies.  Neurological: Negative for numbness.    Past Medical History:  Diagnosis Date  . Allergy   . Gout   . Hyperlipidemia   . Personal history of colonic adenoma 09/28/2012    Past Surgical History:  Procedure Laterality Date  . APPENDECTOMY  1988  . CARPAL TUNNEL RELEASE     bilateral 2000 and 2001  . COLONOSCOPY    . ELBOW SURGERY  2006   muscle repair  . EXPLORATORY LAPAROTOMY  1996   after falling on scissors; repair of "hole in intestines"  . [place]  . UMBILICAL HERNIA REPAIR  1968    Family History  Problem Relation Age of Onset  . Colon cancer Neg Hx   . Kidney cancer Mother   . Diabetes Mother   . COPD Mother   . Anuerysm Father   . Diabetes Father   . Heart disease Father   . Diabetes Sister     Social History Social History  Substance Use Topics  . Smoking status: Never Smoker  . Smokeless tobacco: Current User    Types: Snuff  . Alcohol use No    Allergies  Allergen Reactions  . Iohexol Anaphylaxis    Allergy to IVP dye    Current Outpatient Prescriptions  Medication Sig Dispense Refill  . allopurinol (ZYLOPRIM) 300 MG tablet Take 1 tablet  (300 mg total) by mouth daily. 30 tablet 5  . atorvastatin (LIPITOR) 40 MG tablet Take 1 tablet (40 mg total) by mouth daily. 90 tablet 0  . cetirizine (ZYRTEC) 10 MG tablet Take 1 tablet (10 mg total) by mouth daily. 30 tablet 3  . colchicine 0.6 MG tablet One tid po for five days. 15 tablet 5  . diazepam (VALIUM) 5 MG tablet TAKE 1 TABLET EVERY 12 HOURS AS NEEDED FOR ANXIETY 30 tablet 2  . fluticasone (FLONASE) 50 MCG/ACT nasal spray Place 1 spray into both nostrils 2 (two) times daily as needed for allergies or rhinitis. 16 g 6  . lisinopril (PRINIVIL) 10 MG tablet Take 1 tablet (10 mg total) by mouth daily. 90 tablet 1  . oxyCODONE-acetaminophen (PERCOCET/ROXICET) 5-325 MG tablet Take 1 tablet by mouth every 4 (four) hours as needed for moderate pain or severe pain (Must last 30 days.  Do not drive or operate machinery while taking this medicine). 120 tablet 0  . predniSONE (STERAPRED UNI-PAK 21 TAB) 5 MG (21) TBPK tablet Take 6 pills first day; 5 pills second day; 4 pills third day; 3 pills fourth day; 2 pills next day and 1 pill last day. 21 tablet 0   No current facility-administered medications for this visit.  Physical Exam  Blood pressure (!) 145/88, pulse (!) 58, temperature 97.9 F (36.6 C), height 5\' 7"  (1.702 m), weight 220 lb (99.8 kg).  Constitutional: overall normal hygiene, normal nutrition, well developed, normal grooming, normal body habitus. Assistive device:none  Musculoskeletal: gait and station Limp none, muscle tone and strength are normal, no tremors or atrophy is present.  .  Neurological: coordination overall normal.  Deep tendon reflex/nerve stretch intact.  Sensation normal.  Cranial nerves II-XII intact.   Skin:   Normal overall no scars, lesions, ulcers or rashes. No psoriasis.  Psychiatric: Alert and oriented x 3.  Recent memory intact, remote memory unclear.  Normal mood and affect. Well groomed.  Good eye contact.  Cardiovascular: overall no  swelling, no varicosities, no edema bilaterally, normal temperatures of the legs and arms, no clubbing, cyanosis and good capillary refill.  Lymphatic: palpation is normal.  Examination of right Upper Extremity is done.  Inspection:   Overall:  Elbow non-tender without crepitus or defects, forearm non-tender without crepitus or defects, wrist non-tender without crepitus or defects, hand non-tender.    Shoulder: with glenohumeral joint tenderness, without effusion.   Upper arm: without swelling and tenderness   Range of motion:   Overall:  Full range of motion of the elbow, full range of motion of wrist and full range of motion in fingers.   Shoulder:  right  140 degrees forward flexion; 100 degrees abduction; 30 degrees internal rotation, 30 degrees external rotation, 15 degrees extension, 40 degrees adduction.   Stability:   Overall:  Shoulder, elbow and wrist stable   Strength and Tone:   Overall full shoulder muscles strength, full upper arm strength and normal upper arm bulk and tone.   The patient has been educated about the nature of the problem(s) and counseled on treatment options.  The patient appeared to understand what I have discussed and is in agreement with it.  Encounter Diagnoses  Name Primary?  . Chronic right shoulder pain Yes  . Essential hypertension     PLAN Call if any problems.  Precautions discussed.  Continue current medications.   Return to clinic 2 months   Electronically Signed Sanjuana Kava, MD 12/13/20172:02 PM

## 2016-06-18 ENCOUNTER — Telehealth: Payer: Self-pay | Admitting: *Deleted

## 2016-06-18 ENCOUNTER — Other Ambulatory Visit: Payer: Self-pay | Admitting: Nurse Practitioner

## 2016-06-18 DIAGNOSIS — M25511 Pain in right shoulder: Secondary | ICD-10-CM

## 2016-06-18 NOTE — Telephone Encounter (Signed)
Script for Valium called in.

## 2016-06-18 NOTE — Telephone Encounter (Signed)
Please call in diazepam with   1 refills 

## 2016-06-30 ENCOUNTER — Telehealth: Payer: Self-pay | Admitting: Orthopaedic Surgery

## 2016-06-30 NOTE — Telephone Encounter (Signed)
Patient would like to go back to what you had him on previously. Stated that the Oxycodone makes him sick on his stomach.  He was taking   Hydrocodone-Acetaminop

## 2016-07-01 MED ORDER — HYDROCODONE-ACETAMINOPHEN 7.5-325 MG PO TABS: 1.0000 | ORAL_TABLET | ORAL | 0 refills | Status: DC | PRN

## 2016-07-15 ENCOUNTER — Other Ambulatory Visit: Payer: Self-pay | Admitting: Nurse Practitioner

## 2016-07-15 DIAGNOSIS — E785 Hyperlipidemia, unspecified: Secondary | ICD-10-CM

## 2016-07-30 ENCOUNTER — Ambulatory Visit (INDEPENDENT_AMBULATORY_CARE_PROVIDER_SITE_OTHER): Payer: Medicare Other

## 2016-07-30 ENCOUNTER — Encounter: Payer: Self-pay | Admitting: Orthopaedic Surgery

## 2016-07-30 ENCOUNTER — Ambulatory Visit (INDEPENDENT_AMBULATORY_CARE_PROVIDER_SITE_OTHER): Payer: Medicare Other | Admitting: Orthopaedic Surgery

## 2016-07-30 VITALS — BP 138/85 | HR 58 | Temp 98.1°F | Ht 71.0 in | Wt 221.0 lb

## 2016-07-30 DIAGNOSIS — M542 Cervicalgia: Secondary | ICD-10-CM

## 2016-07-30 DIAGNOSIS — G8929 Other chronic pain: Secondary | ICD-10-CM

## 2016-07-30 DIAGNOSIS — M25511 Pain in right shoulder: Secondary | ICD-10-CM | POA: Diagnosis not present

## 2016-07-30 MED ORDER — HYDROCODONE-ACETAMINOPHEN 7.5-325 MG PO TABS: 1.0000 | ORAL_TABLET | ORAL | 0 refills | Status: DC | PRN

## 2016-07-30 MED ORDER — DIAZEPAM 5 MG PO TABS: ORAL_TABLET | ORAL | 1 refills | Status: DC

## 2016-07-30 NOTE — Progress Notes (Signed)
Patient Andre Hunt, male DOB:May 28, 1961, 56 y.o. [id number]:[id number]  Chief Complaint  Patient presents with  . Follow-up    right shoulder pain    HPI  Andre Hunt is a 56 y.o. male who has more pain of the right shoulder and elbow and neck.  He has pain from the neck to the shoulder and to the lateral right elbow.  He has had chronic lateral epicondylitis and surgery in the past and still has chronic pain here.  He has more pain at night.  He has more paresthesias.  He has no trauma. HPI  Body mass index is 30.82 kg/m.  ROS  Review of Systems  HENT: Negative for congestion.   Respiratory: Negative for cough and shortness of breath.   Cardiovascular: Negative for chest pain and leg swelling.  Endocrine: Positive for cold intolerance.  Musculoskeletal: Positive for arthralgias and joint swelling.  Allergic/Immunologic: Positive for environmental allergies.  Neurological: Negative for numbness.    Past Medical History:  Diagnosis Date  . Allergy   . Gout   . Hyperlipidemia   . Personal history of colonic adenoma 09/28/2012    Past Surgical History:  Procedure Laterality Date  . APPENDECTOMY  1988  . CARPAL TUNNEL RELEASE     bilateral 2000 and 2001  . COLONOSCOPY    . ELBOW SURGERY  2006   muscle repair  . EXPLORATORY LAPAROTOMY  1996   after falling on scissors; repair of "hole in intestines"  . [place]  . UMBILICAL HERNIA REPAIR  1968    Family History  Problem Relation Age of Onset  . Colon cancer Neg Hx   . Kidney cancer Mother   . Diabetes Mother   . COPD Mother   . Anuerysm Father   . Diabetes Father   . Heart disease Father   . Diabetes Sister     Social History Social History  Substance Use Topics  . Smoking status: Never Smoker  . Smokeless tobacco: Current User    Types: Snuff  . Alcohol use No    Allergies  Allergen Reactions  . Iohexol Anaphylaxis    Allergy to IVP dye    Current Outpatient  Prescriptions  Medication Sig Dispense Refill  . allopurinol (ZYLOPRIM) 300 MG tablet Take 1 tablet (300 mg total) by mouth daily. 30 tablet 5  . atorvastatin (LIPITOR) 40 MG tablet TAKE 1 TABLET DAILY 90 tablet 0  . cetirizine (ZYRTEC) 10 MG tablet Take 1 tablet (10 mg total) by mouth daily. 30 tablet 3  . colchicine 0.6 MG tablet One tid po for five days. 15 tablet 5  . diazepam (VALIUM) 5 MG tablet TAKE 1 TABLET EVERY 12 HOURS AS NEEDED FOR ANXIETY 30 tablet 1  . fluticasone (FLONASE) 50 MCG/ACT nasal spray Place 1 spray into both nostrils 2 (two) times daily as needed for allergies or rhinitis. 16 g 6  . HYDROcodone-acetaminophen (NORCO) 7.5-325 MG tablet Take 1 tablet by mouth every 4 (four) hours as needed for moderate pain (Must last 30 days.  Do not drive a call or operate machinery while on this medicine.). 120 tablet 0  . lisinopril (PRINIVIL) 10 MG tablet Take 1 tablet (10 mg total) by mouth daily. 90 tablet 1  . predniSONE (STERAPRED UNI-PAK 21 TAB) 5 MG (21) TBPK tablet Take 6 pills first day; 5 pills second day; 4 pills third day; 3 pills fourth day; 2 pills next day and 1 pill last day. 21 tablet  0   No current facility-administered medications for this visit.      Physical Exam  Blood pressure 138/85, pulse (!) 58, temperature 98.1 F (36.7 C), height 5\' 11"  (1.803 m), weight 221 lb (100.2 kg).  Constitutional: overall normal hygiene, normal nutrition, well developed, normal grooming, normal body habitus. Assistive device:none  Musculoskeletal: gait and station Limp none, muscle tone and strength are normal, no tremors or atrophy is present.  .  Neurological: coordination overall normal.  Deep tendon reflex/nerve stretch intact.  Sensation normal.  Cranial nerves II-XII intact.   Skin:   Normal overall no scars, lesions, ulcers or rashes. No psoriasis.  Psychiatric: Alert and oriented x 3.  Recent memory intact, remote memory unclear.  Normal mood and affect. Well  groomed.  Good eye contact.  Cardiovascular: overall no swelling, no varicosities, no edema bilaterally, normal temperatures of the legs and arms, no clubbing, cyanosis and good capillary refill.  Lymphatic: palpation is normal.  Examination of right Upper Extremity is done.  Inspection:   Overall:  Elbow non-tender without crepitus or defects, forearm non-tender without crepitus or defects, wrist non-tender without crepitus or defects, hand non-tender.    Shoulder: with glenohumeral joint tenderness, without effusion.   Upper arm: without swelling and tenderness   Range of motion:   Overall:  Full range of motion of the elbow, full range of motion of wrist and full range of motion in fingers.   Shoulder:  right  145 degrees forward flexion; 120 degrees abduction; 30 degrees internal rotation, 30 degrees external rotation, 15 degrees extension, 40 degrees adduction.   Stability:   Overall:  Shoulder, elbow and wrist stable   Strength and Tone:   Overall full shoulder muscles strength, full upper arm strength and normal upper arm bulk and tone.  His neck is tender but has full motion.  He has no spasm.  The patient has been educated about the nature of the problem(s) and counseled on treatment options.  The patient appeared to understand what I have discussed and is in agreement with it.  Encounter Diagnoses  Name Primary?  . Neck pain Yes  . Chronic right shoulder pain    X-rays were done of the cervical spine and right shoulder, reported separately.  PROCEDURE NOTE:  The patient request injection, verbal consent was obtained.  The right shoulder was prepped appropriately after time out was performed.   Sterile technique was observed and injection of 1 cc of Depo-Medrol 40 mg with several cc's of plain xylocaine. Anesthesia was provided by ethyl chloride and a 20-gauge needle was used to inject the shoulder area. A posterior approach was used.  The injection was tolerated  well.  A band aid dressing was applied.  The patient was advised to apply ice later today and tomorrow to the injection sight as needed.   PLAN Call if any problems.  Precautions discussed.  Continue current medications.   Return to clinic 2 weeks   I have reviewed the [place] web site prior to prescribing narcotic medicine for this patient.  Begin PT  Electronically Signed Sanjuana Kava, MD 2/14/20182:36 PM

## 2016-08-11 ENCOUNTER — Ambulatory Visit: Payer: Medicare Other | Admitting: Physical Therapy

## 2016-08-14 ENCOUNTER — Ambulatory Visit: Payer: Medicare Other | Admitting: Orthopaedic Surgery

## 2016-08-27 ENCOUNTER — Ambulatory Visit: Payer: Medicare Other | Admitting: Orthopaedic Surgery

## 2016-09-11 ENCOUNTER — Telehealth: Payer: Self-pay | Admitting: Orthopaedic Surgery

## 2016-09-11 MED ORDER — HYDROCODONE-ACETAMINOPHEN 7.5-325 MG PO TABS: 1.0000 | ORAL_TABLET | ORAL | 0 refills | Status: DC | PRN

## 2016-09-11 NOTE — Telephone Encounter (Signed)
Hydrocodone-Acetaminophen 7.5/325mg Qty 120 Tablets °

## 2016-10-07 ENCOUNTER — Other Ambulatory Visit: Payer: Self-pay | Admitting: Nurse Practitioner

## 2016-10-07 DIAGNOSIS — E785 Hyperlipidemia, unspecified: Secondary | ICD-10-CM

## 2016-10-20 ENCOUNTER — Other Ambulatory Visit: Payer: Self-pay | Admitting: Nurse Practitioner

## 2016-10-20 ENCOUNTER — Telehealth: Payer: Self-pay | Admitting: Orthopaedic Surgery

## 2016-10-27 NOTE — Telephone Encounter (Signed)
Refill request received via fax from [facility] for Diazepam 5 mg tablet, 60 tablets, however, request appears to be from [facility].  Patient will wait until appointment 10/29/16 to discuss and request from Dr Luna Glasgow at that time.

## 2016-10-29 ENCOUNTER — Ambulatory Visit (INDEPENDENT_AMBULATORY_CARE_PROVIDER_SITE_OTHER): Payer: Medicare Other | Admitting: Orthopaedic Surgery

## 2016-10-29 ENCOUNTER — Encounter: Payer: Self-pay | Admitting: Orthopaedic Surgery

## 2016-10-29 VITALS — BP 126/80 | HR 57 | Temp 97.9°F | Ht 71.0 in | Wt 212.0 lb

## 2016-10-29 DIAGNOSIS — M542 Cervicalgia: Secondary | ICD-10-CM | POA: Diagnosis not present

## 2016-10-29 DIAGNOSIS — G8929 Other chronic pain: Secondary | ICD-10-CM

## 2016-10-29 DIAGNOSIS — I10 Essential (primary) hypertension: Secondary | ICD-10-CM

## 2016-10-29 DIAGNOSIS — M25511 Pain in right shoulder: Secondary | ICD-10-CM | POA: Diagnosis not present

## 2016-10-29 MED ORDER — HYDROCODONE-ACETAMINOPHEN 7.5-325 MG PO TABS: 1.0000 | ORAL_TABLET | ORAL | 0 refills | Status: DC | PRN

## 2016-10-29 NOTE — Progress Notes (Signed)
Patient XA:JOINO Andre Hunt, male DOB:[birth year], 56 y.o. MVE:[id number]  Chief Complaint  Patient presents with  . Follow-up    right shoulder, neck     HPI  Andre Hunt is a 56 y.o. male who has chronic neck and shoulder pain.  He is stable.  He has no new trauma.  He has no paresthesias. He is active and doing his exercises and taking his medicine. HPI  Body mass index is 29.57 kg/m.  ROS  Review of Systems  HENT: Negative for congestion.   Respiratory: Negative for cough and shortness of breath.   Cardiovascular: Negative for chest pain and leg swelling.  Endocrine: Positive for cold intolerance.  Musculoskeletal: Positive for arthralgias and joint swelling.  Allergic/Immunologic: Positive for environmental allergies.  Neurological: Negative for numbness.    Past Medical History:  Diagnosis Date  . Allergy   . Gout   . Hyperlipidemia   . Personal history of colonic adenoma 09/28/2012    Past Surgical History:  Procedure Laterality Date  . APPENDECTOMY  1988  . CARPAL TUNNEL RELEASE     bilateral 2000 and 2001  . COLONOSCOPY    . ELBOW SURGERY  2006   muscle repair  . EXPLORATORY LAPAROTOMY  1996   after falling on scissors; repair of "hole in intestines"  . [place]  . UMBILICAL HERNIA REPAIR  1968    Family History  Problem Relation Age of Onset  . Colon cancer Neg Hx   . Kidney cancer Mother   . Diabetes Mother   . COPD Mother   . Anuerysm Father   . Diabetes Father   . Heart disease Father   . Diabetes Sister     Social History Social History  Substance Use Topics  . Smoking status: Never Smoker  . Smokeless tobacco: Current User    Types: Snuff  . Alcohol use No    Allergies  Allergen Reactions  . Iohexol Anaphylaxis    Allergy to IVP dye    Current Outpatient Prescriptions  Medication Sig Dispense Refill  . allopurinol (ZYLOPRIM) 300 MG tablet Take 1 tablet (300 mg total) by mouth daily. 30 tablet 5  .  atorvastatin (LIPITOR) 40 MG tablet TAKE 1 TABLET DAILY 90 tablet 0  . cetirizine (ZYRTEC) 10 MG tablet Take 1 tablet (10 mg total) by mouth daily. 30 tablet 3  . colchicine 0.6 MG tablet One tid po for five days. 15 tablet 5  . diazepam (VALIUM) 5 MG tablet TAKE 1 TABLET EVERY 12 HOURS AS NEEDED FOR ANXIETY 30 tablet 1  . diazepam (VALIUM) 5 MG tablet One tablet every 8 hours as needed for spasm. 60 tablet 1  . diazepam (VALIUM) 5 MG tablet TAKE 1 TABLET EVERY 8 HOURS AS NEEDED FOR ANXIETY 60 tablet 0  . fluticasone (FLONASE) 50 MCG/ACT nasal spray Place 1 spray into both nostrils 2 (two) times daily as needed for allergies or rhinitis. 16 g 6  . HYDROcodone-acetaminophen (NORCO) 7.5-325 MG tablet Take 1 tablet by mouth every 4 (four) hours as needed for moderate pain (Must last 30 days.  Do not drive a call or operate machinery while on this medicine.). 110 tablet 0  . lisinopril (PRINIVIL,ZESTRIL) 10 MG tablet TAKE 1 TABLET DAILY 90 tablet 0  . predniSONE (STERAPRED UNI-PAK 21 TAB) 5 MG (21) TBPK tablet Take 6 pills first day; 5 pills second day; 4 pills third day; 3 pills fourth day; 2 pills next day and 1  pill last day. 21 tablet 0   No current facility-administered medications for this visit.      Physical Exam  Blood pressure 126/80, pulse (!) 57, temperature 97.9 F (36.6 C), height 5\' 11"  (1.803 m), weight 212 lb (96.2 kg).  Constitutional: overall normal hygiene, normal nutrition, well developed, normal grooming, normal body habitus. Assistive device:none  Musculoskeletal: gait and station Limp none, muscle tone and strength are normal, no tremors or atrophy is present.  .  Neurological: coordination overall normal.  Deep tendon reflex/nerve stretch intact.  Sensation normal.  Cranial nerves II-XII intact.   Skin:   Normal overall no scars, lesions, ulcers or rashes. No psoriasis.  Psychiatric: Alert and oriented x 3.  Recent memory intact, remote memory unclear.  Normal mood  and affect. Well groomed.  Good eye contact.  Cardiovascular: overall no swelling, no varicosities, no edema bilaterally, normal temperatures of the legs and arms, no clubbing, cyanosis and good capillary refill.  Lymphatic: palpation is normal.  Neck has full ROM and no pain or spasm.  Right shoulder has tenderness in extremes but has full motion today.  NV intact.  Muscle strength and tone are normal.  The patient has been educated about the nature of the problem(s) and counseled on treatment options.  The patient appeared to understand what I have discussed and is in agreement with it.  Encounter Diagnoses  Name Primary?  . Neck pain Yes  . Chronic right shoulder pain   . Essential hypertension     PLAN Call if any problems.  Precautions discussed.  Continue current medications.   Return to clinic 3 months   Electronically Signed Sanjuana Kava, MD 5/16/20181:43 PM

## 2016-11-03 ENCOUNTER — Ambulatory Visit (INDEPENDENT_AMBULATORY_CARE_PROVIDER_SITE_OTHER): Payer: Medicare Other | Admitting: Nurse Practitioner

## 2016-11-03 ENCOUNTER — Encounter: Payer: Self-pay | Admitting: Nurse Practitioner

## 2016-11-03 VITALS — BP 116/72 | HR 58 | Temp 96.7°F | Ht 71.0 in | Wt 216.0 lb

## 2016-11-03 DIAGNOSIS — Z6834 Body mass index (BMI) 34.0-34.9, adult: Secondary | ICD-10-CM | POA: Diagnosis not present

## 2016-11-03 DIAGNOSIS — Z125 Encounter for screening for malignant neoplasm of prostate: Secondary | ICD-10-CM

## 2016-11-03 DIAGNOSIS — I10 Essential (primary) hypertension: Secondary | ICD-10-CM | POA: Diagnosis not present

## 2016-11-03 DIAGNOSIS — M1A00X0 Idiopathic chronic gout, unspecified site, without tophus (tophi): Secondary | ICD-10-CM | POA: Diagnosis not present

## 2016-11-03 DIAGNOSIS — E785 Hyperlipidemia, unspecified: Secondary | ICD-10-CM | POA: Diagnosis not present

## 2016-11-03 DIAGNOSIS — R739 Hyperglycemia, unspecified: Secondary | ICD-10-CM

## 2016-11-03 MED ORDER — ALLOPURINOL 300 MG PO TABS: 300.0000 mg | ORAL_TABLET | Freq: Every day | ORAL | 5 refills | Status: DC

## 2016-11-03 MED ORDER — ATORVASTATIN CALCIUM 40 MG PO TABS: 40.0000 mg | ORAL_TABLET | Freq: Every day | ORAL | 1 refills | Status: DC

## 2016-11-03 MED ORDER — LISINOPRIL 10 MG PO TABS: 10.0000 mg | ORAL_TABLET | Freq: Every day | ORAL | 1 refills | Status: DC

## 2016-11-03 NOTE — Patient Instructions (Signed)
Cholesterol Cholesterol is a fat. Your body needs a small amount of cholesterol. Cholesterol (plaque) may build up in your blood vessels (arteries). That makes you more likely to have a heart attack or stroke. You cannot feel your cholesterol level. Having a blood test is the only way to find out if your level is high. Keep your test results. Work with your doctor to keep your cholesterol at a good level. What do the results mean?  Total cholesterol is how much cholesterol is in your blood.  LDL is bad cholesterol. This is the type that can build up. Try to have low LDL.  HDL is good cholesterol. It cleans your blood vessels and carries LDL away. Try to have high HDL.  Triglycerides are fat that the body can store or burn for energy. What are good levels of cholesterol?  Total cholesterol below 200.  LDL below 100 is good for people who have health risks. LDL below 70 is good for people who have very high risks.  HDL above 40 is good. It is best to have HDL of 60 or higher.  Triglycerides below 150. How can I lower my cholesterol? Diet  Follow your diet program as told by your doctor.  Choose fish, white meat chicken, or turkey that is roasted or baked. Try not to eat red meat, fried foods, sausage, or lunch meats.  Eat lots of fresh fruits and vegetables.  Choose whole grains, beans, pasta, potatoes, and cereals.  Choose olive oil, corn oil, or canola oil. Only use small amounts.  Try not to eat butter, mayonnaise, shortening, or palm kernel oils.  Try not to eat foods with trans fats.  Choose low-fat or nonfat dairy foods.  Drink skim or nonfat milk.  Eat low-fat or nonfat yogurt and cheeses.  Try not to drink whole milk or cream.  Try not to eat ice cream, egg yolks, or full-fat cheeses.  Healthy desserts include angel food cake, ginger snaps, animal crackers, hard candy, popsicles, and low-fat or nonfat frozen yogurt. Try not to eat pastries, cakes, pies, and  cookies. Exercise  Follow your exercise program as told by your doctor.  Be more active. Try gardening, walking, and taking the stairs.  Ask your doctor about ways that you can be more active. Medicine  Take over-the-counter and prescription medicines only as told by your doctor. This information is not intended to replace advice given to you by your health care provider. Make sure you discuss any questions you have with your health care provider. Document Released: 08/29/2008 Document Revised: 01/02/2016 Document Reviewed: 12/13/2015 Elsevier Interactive Patient Education  2017 Elsevier Inc.  

## 2016-11-03 NOTE — Progress Notes (Signed)
Subjective:    Patient ID: Andre Hunt, male    DOB: Nov 02, 1960, 55 y.o.   MRN: [id number]  HPI  Andre Hunt is here today for follow up of chronic medical problem.  Outpatient Encounter Prescriptions as of 11/03/2016  Medication Sig  . allopurinol (ZYLOPRIM) 300 MG tablet Take 1 tablet (300 mg total) by mouth daily.  Marland Kitchen atorvastatin (LIPITOR) 40 MG tablet TAKE 1 TABLET DAILY  . cetirizine (ZYRTEC) 10 MG tablet Take 1 tablet (10 mg total) by mouth daily.  . colchicine 0.6 MG tablet One tid po for five days.  . diazepam (VALIUM) 5 MG tablet TAKE 1 TABLET EVERY 12 HOURS AS NEEDED FOR ANXIETY  . diazepam (VALIUM) 5 MG tablet One tablet every 8 hours as needed for spasm.  . diazepam (VALIUM) 5 MG tablet TAKE 1 TABLET EVERY 8 HOURS AS NEEDED FOR ANXIETY  . fluticasone (FLONASE) 50 MCG/ACT nasal spray Place 1 spray into both nostrils 2 (two) times daily as needed for allergies or rhinitis.  Marland Kitchen HYDROcodone-acetaminophen (NORCO) 7.5-325 MG tablet Take 1 tablet by mouth every 4 (four) hours as needed for moderate pain (Must last 30 days.  Do not drive a call or operate machinery while on this medicine.).  Marland Kitchen lisinopril (PRINIVIL,ZESTRIL) 10 MG tablet TAKE 1 TABLET DAILY    1. Essential hypertension  NO recent c/o chest, SOB or ha. Doe snot check blood pressure at home  2. BMI 34.0-34.9,adult  No recent weight gain or weight loss  3. Hyperlipidemia with target LDL less than 100  Low fat diet attemted  4. Idiopathic chronic gout without tophus, unspecified site   Last gout flare up was great then 6 months ago    New complaints: None today    Review of Systems  Constitutional: Negative for diaphoresis.  Eyes: Negative for pain.  Respiratory: Negative for shortness of breath.   Cardiovascular: Negative for chest pain, palpitations and leg swelling.  Gastrointestinal: Negative for abdominal pain.  Endocrine: Negative for polydipsia.  Skin: Negative for rash.  Neurological:  Negative for dizziness, weakness and headaches.  Hematological: Does not bruise/bleed easily.       Objective:   Physical Exam  Constitutional: He is oriented to person, place, and time. He appears well-developed and well-nourished.  HENT:  Head: Normocephalic.  Right Ear: External ear normal.  Left Ear: External ear normal.  Nose: Nose normal.  Mouth/Throat: Oropharynx is clear and moist.  Eyes: EOM are normal. Pupils are equal, round, and reactive to light.  Neck: Normal range of motion. Neck supple. No JVD present. No thyromegaly present.  Cardiovascular: Normal rate, regular rhythm, normal heart sounds and intact distal pulses.  Exam reveals no gallop and no friction rub.   No murmur heard. Pulmonary/Chest: Effort normal and breath sounds normal. No respiratory distress. He has no wheezes. He has no rales. He exhibits no tenderness.  Abdominal: Soft. Bowel sounds are normal. He exhibits no mass. There is no tenderness.  Musculoskeletal: Normal range of motion. He exhibits no edema.  Lymphadenopathy:    He has no cervical adenopathy.  Neurological: He is alert and oriented to person, place, and time. No cranial nerve deficit.  Skin: Skin is warm and dry.  Psychiatric: He has a normal mood and affect. His behavior is normal. Judgment and thought content normal.   BP 116/72   Pulse (!) 58   Temp (!) 96.7 F (35.9 C) (Oral)   Ht 5' 11"  (1.803 m)   Wt  216 lb (98 kg)   BMI 30.13 kg/m        Assessment & Plan:  1. Essential hypertension Low sodium diet - CMP14+EGFR - lisinopril (PRINIVIL,ZESTRIL) 10 MG tablet; Take 1 tablet (10 mg total) by mouth daily.  Dispense: 90 tablet; Refill: 1  2. BMI 34.0-34.9,adult Discussed diet and exercise for person with BMI >25 Will recheck weight in 3-6 months   3. Hyperlipidemia with target LDL less than 100 Low fat diet encouraged - Lipid panel - atorvastatin (LIPITOR) 40 MG tablet; Take 1 tablet (40 mg total) by mouth daily.   Dispense: 90 tablet; Refill: 1  4. 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  Dispense: 30 tablet; Refill: 5  5. Prostate cancer screening  - PSA, total and free    Labs pending Health maintenance reviewed Diet and exercise encouraged Continue all meds Follow up  In 6 months   [place], FNP

## 2016-11-04 ENCOUNTER — Other Ambulatory Visit: Payer: Self-pay | Admitting: Nurse Practitioner

## 2016-11-04 DIAGNOSIS — R739 Hyperglycemia, unspecified: Secondary | ICD-10-CM

## 2016-11-04 LAB — LIPID PANEL
CHOL/HDL RATIO: 2.5 ratio (ref 0.0–5.0)
Cholesterol, Total: 97 mg/dL — ABNORMAL LOW (ref 100–199)
HDL: 39 mg/dL — AB (ref >39)
LDL Calculated: 36 mg/dL (ref 0–99)
Triglycerides: 109 mg/dL (ref 0–149)
VLDL Cholesterol Cal: 22 mg/dL (ref 5–40)

## 2016-11-04 LAB — CMP14+EGFR
A/G RATIO: 2 (ref 1.2–2.2)
ALT: 36 IU/L (ref 0–44)
AST: 24 IU/L (ref 0–40)
Albumin: 4.3 g/dL (ref 3.5–5.5)
Alkaline Phosphatase: 60 IU/L (ref 39–117)
BILIRUBIN TOTAL: 0.5 mg/dL (ref 0.0–1.2)
BUN / CREAT RATIO: 18 (ref 9–20)
BUN: 17 mg/dL (ref 6–24)
CHLORIDE: 105 mmol/L (ref 96–106)
CO2: 22 mmol/L (ref 18–29)
Calcium: 9.2 mg/dL (ref 8.7–10.2)
Creatinine, Ser: 0.95 mg/dL (ref 0.76–1.27)
GFR, EST AFRICAN AMERICAN: 104 mL/min/{1.73_m2} (ref >59)
GFR, EST NON AFRICAN AMERICAN: 90 mL/min/{1.73_m2} (ref >59)
GLOBULIN, TOTAL: 2.2 g/dL (ref 1.5–4.5)
Glucose: 159 mg/dL — ABNORMAL HIGH (ref 65–99)
POTASSIUM: 5 mmol/L (ref 3.5–5.2)
SODIUM: 142 mmol/L (ref 134–144)
TOTAL PROTEIN: 6.5 g/dL (ref 6.0–8.5)

## 2016-11-04 LAB — PSA, TOTAL AND FREE
PROSTATE SPECIFIC AG, SERUM: 0.8 ng/mL (ref 0.0–4.0)
PSA FREE PCT: 56.3 %
PSA FREE: 0.45 ng/mL

## 2016-11-04 NOTE — Addendum Note (Signed)
Addended by: Liliane Bade on: 11/04/2016 03:31 PM   Modules accepted: Orders

## 2016-11-18 ENCOUNTER — Telehealth: Payer: Self-pay

## 2016-11-18 ENCOUNTER — Other Ambulatory Visit: Payer: Self-pay

## 2016-11-18 DIAGNOSIS — R739 Hyperglycemia, unspecified: Secondary | ICD-10-CM

## 2016-11-18 NOTE — Telephone Encounter (Signed)
Contacted patient to advise him that we attempted to add a Hgb A1C but there was not enough blood. Asked if he could drop by the office for a finger stick and he agreed. Will stop by this week.

## 2016-11-27 ENCOUNTER — Telehealth: Payer: Self-pay | Admitting: Orthopaedic Surgery

## 2016-11-27 MED ORDER — HYDROCODONE-ACETAMINOPHEN 7.5-325 MG PO TABS: 1.0000 | ORAL_TABLET | ORAL | 0 refills | Status: DC | PRN

## 2016-11-27 NOTE — Telephone Encounter (Signed)
Hydrocodone-Acetaminophen  7.5/325mg  Qty 110 Tablets °

## 2016-12-08 ENCOUNTER — Telehealth: Payer: Self-pay | Admitting: Orthopaedic Surgery

## 2017-01-07 ENCOUNTER — Telehealth: Payer: Self-pay | Admitting: Orthopaedic Surgery

## 2017-01-07 MED ORDER — HYDROCODONE-ACETAMINOPHEN 7.5-325 MG PO TABS: 1.0000 | ORAL_TABLET | ORAL | 0 refills | Status: DC | PRN

## 2017-01-07 NOTE — Telephone Encounter (Signed)
Patient requests refill on Hydrocodone/Acetaminophen 7.5-325  Mgs.   Qty  110  Sig: Take 1 tablet by mouth every 4 (four) hours as needed for moderate pain (Must last 30 days. Do not drive a call or operate machinery while on this medicine.).

## 2017-01-26 ENCOUNTER — Telehealth: Payer: Self-pay | Admitting: Orthopaedic Surgery

## 2017-01-29 ENCOUNTER — Ambulatory Visit (INDEPENDENT_AMBULATORY_CARE_PROVIDER_SITE_OTHER): Payer: Medicare Other | Admitting: Orthopaedic Surgery

## 2017-01-29 ENCOUNTER — Encounter: Payer: Self-pay | Admitting: Orthopaedic Surgery

## 2017-01-29 VITALS — BP 138/86 | HR 59 | Temp 97.9°F | Ht 71.0 in | Wt 213.0 lb

## 2017-01-29 DIAGNOSIS — G8929 Other chronic pain: Secondary | ICD-10-CM | POA: Diagnosis not present

## 2017-01-29 DIAGNOSIS — M25511 Pain in right shoulder: Secondary | ICD-10-CM | POA: Diagnosis not present

## 2017-01-29 MED ORDER — PREDNISONE 5 MG (21) PO TBPK: ORAL_TABLET | ORAL | 0 refills | Status: DC

## 2017-01-29 NOTE — Progress Notes (Signed)
Patient [name], male DOB:Aug 04, 1960, 56 y.o. [id number]:[id number]  Chief Complaint  Patient presents with  . Follow-up    shoulder pain    HPI  Andre Hunt is a 56 y.o. male who has chronic right shoulder pain.  He has been working on his house and has more pain in the right shoulder.  He has no numbness, no weakness, no swelling.  He is taking his medicine. HPI  Body mass index is 29.71 kg/m.  ROS  Review of Systems  HENT: Negative for congestion.   Respiratory: Negative for cough and shortness of breath.   Cardiovascular: Negative for chest pain and leg swelling.  Endocrine: Positive for cold intolerance.  Musculoskeletal: Positive for arthralgias and joint swelling.  Allergic/Immunologic: Positive for environmental allergies.  Neurological: Negative for numbness.    Past Medical History:  Diagnosis Date  . Allergy   . Gout   . Hyperlipidemia   . Personal history of colonic adenoma 09/28/2012    Past Surgical History:  Procedure Laterality Date  . APPENDECTOMY  1988  . CARPAL TUNNEL RELEASE     bilateral 2000 and 2001  . COLONOSCOPY    . ELBOW SURGERY  2006   muscle repair  . EXPLORATORY LAPAROTOMY  1996   after falling on scissors; repair of "hole in intestines"  . [place]  . UMBILICAL HERNIA REPAIR  1968    Family History  Problem Relation Age of Onset  . Colon cancer Neg Hx   . Kidney cancer Mother   . Diabetes Mother   . COPD Mother   . Anuerysm Father   . Diabetes Father   . Heart disease Father   . Diabetes Sister     Social History Social History  Substance Use Topics  . Smoking status: Never Smoker  . Smokeless tobacco: Current User    Types: Snuff  . Alcohol use No    Allergies  Allergen Reactions  . Iohexol Anaphylaxis    Allergy to IVP dye    Current Outpatient Prescriptions  Medication Sig Dispense Refill  . allopurinol (ZYLOPRIM) 300 MG tablet Take 1 tablet (300 mg total) by mouth daily. 30  tablet 5  . atorvastatin (LIPITOR) 40 MG tablet Take 1 tablet (40 mg total) by mouth daily. 90 tablet 1  . cetirizine (ZYRTEC) 10 MG tablet Take 1 tablet (10 mg total) by mouth daily. 30 tablet 3  . colchicine 0.6 MG tablet One tid po for five days. 15 tablet 5  . diazepam (VALIUM) 5 MG tablet TAKE 1 TABLET EVERY 8 HOURS AS NEEDED FOR ANXIETY 60 tablet 1  . fluticasone (FLONASE) 50 MCG/ACT nasal spray Place 1 spray into both nostrils 2 (two) times daily as needed for allergies or rhinitis. 16 g 6  . HYDROcodone-acetaminophen (NORCO) 7.5-325 MG tablet Take 1 tablet by mouth every 4 (four) hours as needed for moderate pain (Must last 30 days.  Do not drive a call or operate machinery while on this medicine.). 110 tablet 0  . lisinopril (PRINIVIL,ZESTRIL) 10 MG tablet Take 1 tablet (10 mg total) by mouth daily. 90 tablet 1  . predniSONE (STERAPRED UNI-PAK 21 TAB) 5 MG (21) TBPK tablet Take 6 pills first day; 5 pills second day; 4 pills third day; 3 pills fourth day; 2 pills next day and 1 pill last day. 21 tablet 0   No current facility-administered medications for this visit.      Physical Exam  Blood pressure 138/86, pulse Marland Kitchen)  59, temperature 97.9 F (36.6 C), height 5\' 11"  (1.803 m), weight 213 lb (96.6 kg).  Constitutional: overall normal hygiene, normal nutrition, well developed, normal grooming, normal body habitus. Assistive device:none  Musculoskeletal: gait and station Limp none, muscle tone and strength are normal, no tremors or atrophy is present.  .  Neurological: coordination overall normal.  Deep tendon reflex/nerve stretch intact.  Sensation normal.  Cranial nerves II-XII intact.   Skin:   Normal overall no scars, lesions, ulcers or rashes. No psoriasis.  Psychiatric: Alert and oriented x 3.  Recent memory intact, remote memory unclear.  Normal mood and affect. Well groomed.  Good eye contact.  Cardiovascular: overall no swelling, no varicosities, no edema bilaterally,  normal temperatures of the legs and arms, no clubbing, cyanosis and good capillary refill.  Lymphatic: palpation is normal.  His right shoulder has full motion but pain in the extremes, more overhead and with extension.  He has no effusion or crepitus.  NV intact.  The patient has been educated about the nature of the problem(s) and counseled on treatment options.  The patient appeared to understand what I have discussed and is in agreement with it.  No diagnosis found.  PLAN Call if any problems.  Precautions discussed.  Continue current medications.   Return to clinic 3 months   Electronically Signed Sanjuana Kava, MD 8/16/20188:12 AM

## 2017-02-10 ENCOUNTER — Telehealth: Payer: Self-pay | Admitting: Orthopaedic Surgery

## 2017-02-10 MED ORDER — HYDROCODONE-ACETAMINOPHEN 7.5-325 MG PO TABS: 1.0000 | ORAL_TABLET | ORAL | 0 refills | Status: DC | PRN

## 2017-02-10 NOTE — Telephone Encounter (Signed)
Patient requests refill on Hydrocodone/Acetaminophen 7.5-325 mgs.   Qty   110       Sig: Take 1 tablet by mouth every 4 (four) hours as needed for moderate pain (Must last 30 days. Do not drive a call or operate machinery while on this medicine.).

## 2017-02-20 ENCOUNTER — Other Ambulatory Visit: Payer: Self-pay | Admitting: Nurse Practitioner

## 2017-03-16 ENCOUNTER — Telehealth: Payer: Self-pay | Admitting: Orthopaedic Surgery

## 2017-03-16 NOTE — Telephone Encounter (Signed)
Patient called and requested refill on Hydrocodone/Acetaminophen 7.5-325  Mgs.  Qty  105   Sig: Take 1 tablet by mouth every 4 (four) hours as needed for moderate pain (Must last 30 days. Do not drive a call or operate machinery while on this medicine.).

## 2017-03-18 MED ORDER — HYDROCODONE-ACETAMINOPHEN 7.5-325 MG PO TABS
1.0000 | ORAL_TABLET | ORAL | 0 refills | Status: DC | PRN
Start: 2017-03-18 — End: 2017-04-17

## 2017-03-23 ENCOUNTER — Other Ambulatory Visit: Payer: Self-pay | Admitting: Nurse Practitioner

## 2017-04-17 ENCOUNTER — Telehealth: Payer: Self-pay | Admitting: Orthopedic Surgery

## 2017-04-17 ENCOUNTER — Telehealth: Payer: Self-pay | Admitting: Orthopaedic Surgery

## 2017-04-17 ENCOUNTER — Other Ambulatory Visit: Payer: Self-pay | Admitting: Orthopedic Surgery

## 2017-04-17 MED ORDER — HYDROCODONE-ACETAMINOPHEN 7.5-325 MG PO TABS: 1.0000 | ORAL_TABLET | ORAL | 0 refills | Status: DC | PRN

## 2017-04-17 MED ORDER — DIAZEPAM 5 MG PO TABS: ORAL_TABLET | ORAL | 1 refills | Status: DC

## 2017-04-17 NOTE — Telephone Encounter (Signed)
reday

## 2017-04-17 NOTE — Progress Notes (Signed)
090 

## 2017-04-17 NOTE — Telephone Encounter (Signed)
Diazepam (VALIUM) 5 MG  Qty 60 Tablets Take 1 tablet every 8 hours as needed for anxiety.   Hydrocodone-Acetaminophen  7.5/325 mg Qty 105 Tablets Take 1 tablet by mouth every 4 (four) hours as needed for moderate pain (Must last 30 days. Do not drive a car or operate machinery while on this medicine).

## 2017-05-05 ENCOUNTER — Ambulatory Visit: Payer: Medicare Other | Admitting: Orthopaedic Surgery

## 2017-05-06 ENCOUNTER — Encounter: Payer: Self-pay | Admitting: Orthopaedic Surgery

## 2017-05-06 ENCOUNTER — Ambulatory Visit (INDEPENDENT_AMBULATORY_CARE_PROVIDER_SITE_OTHER): Payer: Medicare Other | Admitting: Orthopaedic Surgery

## 2017-05-06 VITALS — BP 119/79 | HR 61 | Temp 97.0°F | Ht 71.0 in | Wt 221.0 lb

## 2017-05-06 DIAGNOSIS — I10 Essential (primary) hypertension: Secondary | ICD-10-CM

## 2017-05-06 DIAGNOSIS — G8929 Other chronic pain: Secondary | ICD-10-CM | POA: Diagnosis not present

## 2017-05-06 DIAGNOSIS — M25511 Pain in right shoulder: Secondary | ICD-10-CM

## 2017-05-06 NOTE — Progress Notes (Signed)
Patient FG:HWEXH [name], male DOB:12/16/1960, 56 y.o. [id number]  Chief Complaint  Patient presents with  . Shoulder Pain    Right    HPI  QUANTEZ SCHNYDER is a 56 y.o. male who has chronic right shoulder pain.  He is stable.  He has no new trauma. He has no paresthesias.  He is doing his exercises.  He is taking his medicine. HPI  Body mass index is 30.82 kg/m.  ROS  Review of Systems  HENT: Negative for congestion.   Respiratory: Negative for cough and shortness of breath.   Cardiovascular: Negative for chest pain and leg swelling.  Endocrine: Positive for cold intolerance.  Musculoskeletal: Positive for arthralgias and joint swelling.  Allergic/Immunologic: Positive for environmental allergies.  Neurological: Negative for numbness.    Past Medical History:  Diagnosis Date  . Allergy   . Gout   . Hyperlipidemia   . Personal history of colonic adenoma 09/28/2012    Past Surgical History:  Procedure Laterality Date  . APPENDECTOMY  1988  . CARPAL TUNNEL RELEASE     bilateral 2000 and 2001  . COLONOSCOPY    . ELBOW SURGERY  2006   muscle repair  . EXPLORATORY LAPAROTOMY  1996   after falling on scissors; repair of "hole in intestines"  . [place]  . UMBILICAL HERNIA REPAIR  1968    Family History  Problem Relation Age of Onset  . Colon cancer Neg Hx   . Kidney cancer Mother   . Diabetes Mother   . COPD Mother   . Anuerysm Father   . Diabetes Father   . Heart disease Father   . Diabetes Sister     Social History Social History   Tobacco Use  . Smoking status: Never Smoker  . Smokeless tobacco: Current User    Types: Snuff  Substance Use Topics  . Alcohol use: No  . Drug use: No    Allergies  Allergen Reactions  . Iohexol Anaphylaxis    Allergy to IVP dye    Current Outpatient Medications  Medication Sig Dispense Refill  . allopurinol (ZYLOPRIM) 300 MG tablet Take 1 tablet (300 mg total) by mouth daily. 30 tablet  5  . atorvastatin (LIPITOR) 40 MG tablet Take 1 tablet (40 mg total) by mouth daily. 90 tablet 1  . cetirizine (ZYRTEC) 10 MG tablet Take 1 tablet (10 mg total) by mouth daily. 30 tablet 0  . colchicine 0.6 MG tablet One tid po for five days. 15 tablet 5  . diazepam (VALIUM) 5 MG tablet TAKE 1 TABLET EVERY 8 HOURS AS NEEDED FOR ANXIETY 60 tablet 1  . fluticasone (FLONASE) 50 MCG/ACT nasal spray Place 1 spray into both nostrils 2 (two) times daily as needed for allergies or rhinitis. 16 g 6  . HYDROcodone-acetaminophen (NORCO) 7.5-325 MG tablet Take 1 tablet by mouth every 4 (four) hours as needed for moderate pain (Must last 30 days.  Do not drive a call or operate machinery while on this medicine.). 105 tablet 0  . lisinopril (PRINIVIL,ZESTRIL) 10 MG tablet Take 1 tablet (10 mg total) by mouth daily. 90 tablet 1  . predniSONE (STERAPRED UNI-PAK 21 TAB) 5 MG (21) TBPK tablet Take 6 pills first day; 5 pills second day; 4 pills third day; 3 pills fourth day; 2 pills next day and 1 pill last day. 21 tablet 0   No current facility-administered medications for this visit.      Physical Exam  Blood pressure 119/79, pulse 61, temperature (!) 97 F (36.1 C), height 5\' 11"  (1.803 m), weight 221 lb (100.2 kg).  Constitutional: overall normal hygiene, normal nutrition, well developed, normal grooming, normal body habitus. Assistive device:none  Musculoskeletal: gait and station Limp none, muscle tone and strength are normal, no tremors or atrophy is present.  .  Neurological: coordination overall normal.  Deep tendon reflex/nerve stretch intact.  Sensation normal.  Cranial nerves II-XII intact.   Skin:   Normal overall no scars, lesions, ulcers or rashes. No psoriasis.  Psychiatric: Alert and oriented x 3.  Recent memory intact, remote memory unclear.  Normal mood and affect. Well groomed.  Good eye contact.  Cardiovascular: overall no swelling, no varicosities, no edema bilaterally, normal  temperatures of the legs and arms, no clubbing, cyanosis and good capillary refill.  Lymphatic: palpation is normal.  All other systems reviewed and are negative   Examination of bilaterally Upper Extremity is done.  Inspection:   Overall:  Elbow non-tender without crepitus or defects, forearm non-tender without crepitus or defects, wrist non-tender without crepitus or defects, hand non-tender.    Shoulder: with glenohumeral joint tenderness, without effusion.   Upper arm: with swelling and tenderness   Range of motion:   Overall:  Full range of motion of the elbow, full range of motion of wrist and full range of motion in fingers.   Shoulder:  right  165 degrees forward flexion; 150 degrees abduction; 35 degrees internal rotation, 35 degrees external rotation, 15 degrees extension, 40 degrees adduction.   Stability:   Overall:  Shoulder, elbow and wrist stable   Strength and Tone:   Overall full shoulder muscles strength, full upper arm strength and normal upper arm bulk and tone.  The patient has been educated about the nature of the problem(s) and counseled on treatment options.  The patient appeared to understand what I have discussed and is in agreement with it.  Encounter Diagnoses  Name Primary?  . Chronic right shoulder pain Yes  . Essential hypertension     PLAN Call if any problems.  Precautions discussed.  Continue current medications.   Return to clinic 6 weeks   Electronically Signed Sanjuana Kava, MD 11/21/20189:39 AM

## 2017-05-20 ENCOUNTER — Telehealth: Payer: Self-pay | Admitting: Orthopaedic Surgery

## 2017-05-20 MED ORDER — HYDROCODONE-ACETAMINOPHEN 7.5-325 MG PO TABS: 1.0000 | ORAL_TABLET | ORAL | 0 refills | Status: DC | PRN

## 2017-05-20 NOTE — Telephone Encounter (Signed)
Patient called to request refill:  HYDROcodone-acetaminophen (NORCO) 7.5-325 MG tablet 105 tablet

## 2017-06-17 ENCOUNTER — Ambulatory Visit (INDEPENDENT_AMBULATORY_CARE_PROVIDER_SITE_OTHER): Payer: Medicare Other | Admitting: Orthopaedic Surgery

## 2017-06-17 ENCOUNTER — Encounter: Payer: Self-pay | Admitting: Orthopaedic Surgery

## 2017-06-17 VITALS — BP 131/76 | HR 67 | Temp 97.0°F | Ht 71.0 in | Wt 220.0 lb

## 2017-06-17 DIAGNOSIS — M25511 Pain in right shoulder: Secondary | ICD-10-CM | POA: Diagnosis not present

## 2017-06-17 DIAGNOSIS — G8929 Other chronic pain: Secondary | ICD-10-CM | POA: Diagnosis not present

## 2017-06-17 DIAGNOSIS — I10 Essential (primary) hypertension: Secondary | ICD-10-CM

## 2017-06-17 MED ORDER — DIAZEPAM 5 MG PO TABS: ORAL_TABLET | ORAL | 3 refills | Status: DC

## 2017-06-17 MED ORDER — HYDROCODONE-ACETAMINOPHEN 7.5-325 MG PO TABS: 1.0000 | ORAL_TABLET | ORAL | 0 refills | Status: DC | PRN

## 2017-06-17 NOTE — Progress Notes (Signed)
Patient Andre Hunt, male DOB:[birth year], 57 y.o. [id number]:[id number]  Chief Complaint  Patient presents with  . Follow-up    Right Shoulder Pain    HPI  Andre Hunt is a 57 y.o. male who has chronic shoulder pain on the right.  He has pain more with cold weather and sleeping on it at night.  He has no new trauma, no paresthesias.  He is taking his medicine and doing his exercises. He had flare up last week.  He is better today. HPI  Body mass index is 30.68 kg/m.  ROS  Review of Systems  HENT: Negative for congestion.   Respiratory: Negative for cough and shortness of breath.   Cardiovascular: Negative for chest pain and leg swelling.  Endocrine: Positive for cold intolerance.  Musculoskeletal: Positive for arthralgias and joint swelling.  Allergic/Immunologic: Positive for environmental allergies.  Neurological: Negative for numbness.    Past Medical History:  Diagnosis Date  . Allergy   . Gout   . Hyperlipidemia   . Personal history of colonic adenoma 09/28/2012    Past Surgical History:  Procedure Laterality Date  . APPENDECTOMY  1988  . CARPAL TUNNEL RELEASE     bilateral 2000 and 2001  . COLONOSCOPY    . ELBOW SURGERY  2006   muscle repair  . EXPLORATORY LAPAROTOMY  1996   after falling on scissors; repair of "hole in intestines"  . [place]  . UMBILICAL HERNIA REPAIR  1968    Family History  Problem Relation Age of Onset  . Kidney cancer Mother   . Diabetes Mother   . COPD Mother   . Anuerysm Father   . Diabetes Father   . Heart disease Father   . Diabetes Sister   . Colon cancer Neg Hx     Social History Social History   Tobacco Use  . Smoking status: Never Smoker  . Smokeless tobacco: Current User    Types: Snuff  Substance Use Topics  . Alcohol use: No  . Drug use: No    Allergies  Allergen Reactions  . Iohexol Anaphylaxis    Allergy to IVP dye    Current Outpatient Medications  Medication Sig Dispense  Refill  . allopurinol (ZYLOPRIM) 300 MG tablet Take 1 tablet (300 mg total) by mouth daily. 30 tablet 5  . atorvastatin (LIPITOR) 40 MG tablet Take 1 tablet (40 mg total) by mouth daily. 90 tablet 1  . cetirizine (ZYRTEC) 10 MG tablet Take 1 tablet (10 mg total) by mouth daily. 30 tablet 0  . colchicine 0.6 MG tablet One tid po for five days. 15 tablet 5  . diazepam (VALIUM) 5 MG tablet TAKE 1 TABLET EVERY 8 HOURS AS NEEDED FOR ANXIETY 60 tablet 3  . fluticasone (FLONASE) 50 MCG/ACT nasal spray Place 1 spray into both nostrils 2 (two) times daily as needed for allergies or rhinitis. 16 g 6  . HYDROcodone-acetaminophen (NORCO) 7.5-325 MG tablet Take 1 tablet by mouth every 4 (four) hours as needed for moderate pain (Must last 30 days.  Do not drive a call or operate machinery while on this medicine.). 105 tablet 0  . lisinopril (PRINIVIL,ZESTRIL) 10 MG tablet Take 1 tablet (10 mg total) by mouth daily. 90 tablet 1  . predniSONE (STERAPRED UNI-PAK 21 TAB) 5 MG (21) TBPK tablet Take 6 pills first day; 5 pills second day; 4 pills third day; 3 pills fourth day; 2 pills next day and 1 pill last day.  21 tablet 0   No current facility-administered medications for this visit.      Physical Exam  Blood pressure 131/76, pulse 67, temperature (!) 97 F (36.1 C), height 5\' 11"  (1.803 m), weight 220 lb (99.8 kg).  Constitutional: overall normal hygiene, normal nutrition, well developed, normal grooming, normal body habitus. Assistive device:none  Musculoskeletal: gait and station Limp none, muscle tone and strength are normal, no tremors or atrophy is present.  .  Neurological: coordination overall normal.  Deep tendon reflex/nerve stretch intact.  Sensation normal.  Cranial nerves II-XII intact.   Skin:   Normal overall no scars, lesions, ulcers or rashes. No psoriasis.  Psychiatric: Alert and oriented x 3.  Recent memory intact, remote memory unclear.  Normal mood and affect. Well groomed.  Good eye  contact.  Cardiovascular: overall no swelling, no varicosities, no edema bilaterally, normal temperatures of the legs and arms, no clubbing, cyanosis and good capillary refill.  Lymphatic: palpation is normal.  All other systems reviewed and are negative   Examination of right Upper Extremity is done.  Inspection:   Overall:  Elbow non-tender without crepitus or defects, forearm non-tender without crepitus or defects, wrist non-tender without crepitus or defects, hand non-tender.    Shoulder: with glenohumeral joint tenderness, without effusion.   Upper arm: without swelling and tenderness   Range of motion:   Overall:  Full range of motion of the elbow, full range of motion of wrist and full range of motion in fingers.   Shoulder:  right  165 degrees forward flexion; 145 degrees abduction; 35 degrees internal rotation, 35 degrees external rotation, 15 degrees extension, 40 degrees adduction.   Stability:   Overall:  Shoulder, elbow and wrist stable   Strength and Tone:   Overall full shoulder muscles strength, full upper arm strength and normal upper arm bulk and tone. The patient has been educated about the nature of the problem(s) and counseled on treatment options.  The patient appeared to understand what I have discussed and is in agreement with it.  Encounter Diagnoses  Name Primary?  . Chronic right shoulder pain Yes  . Essential hypertension     PLAN Call if any problems.  Precautions discussed.  Continue current medications.   Return to clinic 6 weeks   I have reviewed the [place] web site prior to prescribing narcotic medicine for this patient.  Electronically Signed Sanjuana Kava, MD 1/2/20198:59 AM

## 2017-07-27 ENCOUNTER — Other Ambulatory Visit: Payer: Self-pay | Admitting: Nurse Practitioner

## 2017-07-27 DIAGNOSIS — I10 Essential (primary) hypertension: Secondary | ICD-10-CM

## 2017-07-29 ENCOUNTER — Encounter: Payer: Self-pay | Admitting: Orthopaedic Surgery

## 2017-07-29 ENCOUNTER — Ambulatory Visit (INDEPENDENT_AMBULATORY_CARE_PROVIDER_SITE_OTHER): Payer: Medicare Other | Admitting: Orthopaedic Surgery

## 2017-07-29 VITALS — BP 125/71 | HR 62 | Ht 71.0 in | Wt 218.0 lb

## 2017-07-29 DIAGNOSIS — I10 Essential (primary) hypertension: Secondary | ICD-10-CM | POA: Diagnosis not present

## 2017-07-29 DIAGNOSIS — M25511 Pain in right shoulder: Secondary | ICD-10-CM | POA: Diagnosis not present

## 2017-07-29 DIAGNOSIS — G8929 Other chronic pain: Secondary | ICD-10-CM

## 2017-07-29 MED ORDER — HYDROCODONE-ACETAMINOPHEN 7.5-325 MG PO TABS: 1.0000 | ORAL_TABLET | ORAL | 0 refills | Status: DC | PRN

## 2017-07-29 MED ORDER — PREDNISONE 5 MG (21) PO TBPK: ORAL_TABLET | ORAL | 0 refills | Status: DC

## 2017-07-29 NOTE — Progress Notes (Signed)
Patient Andre Hunt, male DOB:Oct 14, 1960, 57 y.o. ATF:[id number]  Chief Complaint  Patient presents with  . Shoulder Pain    right     HPI  Andre Hunt is a 57 y.o. male who has chronic right shoulder pain and right elbow pain.  He has no new trauma. He has pain with overhead use. He has had more pain with the cold weather.  He has no numbness. HPI  Body mass index is 30.4 kg/m.  ROS  Review of Systems  HENT: Negative for congestion.   Respiratory: Negative for cough and shortness of breath.   Cardiovascular: Negative for chest pain and leg swelling.  Endocrine: Positive for cold intolerance.  Musculoskeletal: Positive for arthralgias and joint swelling.  Allergic/Immunologic: Positive for environmental allergies.  Neurological: Negative for numbness.    Past Medical History:  Diagnosis Date  . Allergy   . Gout   . Hyperlipidemia   . Personal history of colonic adenoma 09/28/2012    Past Surgical History:  Procedure Laterality Date  . APPENDECTOMY  1988  . CARPAL TUNNEL RELEASE     bilateral 2000 and 2001  . COLONOSCOPY    . ELBOW SURGERY  2006   muscle repair  . EXPLORATORY LAPAROTOMY  1996   after falling on scissors; repair of "hole in intestines"  . [place]  . UMBILICAL HERNIA REPAIR  1968    Family History  Problem Relation Age of Onset  . Kidney cancer Mother   . Diabetes Mother   . COPD Mother   . Anuerysm Father   . Diabetes Father   . Heart disease Father   . Diabetes Sister   . Colon cancer Neg Hx     Social History Social History   Tobacco Use  . Smoking status: Never Smoker  . Smokeless tobacco: Current User    Types: Snuff  Substance Use Topics  . Alcohol use: No  . Drug use: No    Allergies  Allergen Reactions  . Iohexol Anaphylaxis    Allergy to IVP dye    Current Outpatient Medications  Medication Sig Dispense Refill  . allopurinol (ZYLOPRIM) 300 MG tablet Take 1 tablet (300 mg total) by  mouth daily. 30 tablet 5  . atorvastatin (LIPITOR) 40 MG tablet Take 1 tablet (40 mg total) by mouth daily. 90 tablet 1  . cetirizine (ZYRTEC) 10 MG tablet Take 1 tablet (10 mg total) by mouth daily. 30 tablet 0  . colchicine 0.6 MG tablet One tid po for five days. 15 tablet 5  . diazepam (VALIUM) 5 MG tablet TAKE 1 TABLET EVERY 8 HOURS AS NEEDED FOR ANXIETY 60 tablet 3  . fluticasone (FLONASE) 50 MCG/ACT nasal spray Place 1 spray into both nostrils 2 (two) times daily as needed for allergies or rhinitis. 16 g 6  . HYDROcodone-acetaminophen (NORCO) 7.5-325 MG tablet Take 1 tablet by mouth every 4 (four) hours as needed for moderate pain (Must last 30 days). 105 tablet 0  . lisinopril (PRINIVIL,ZESTRIL) 10 MG tablet TAKE 1 TABLET DAILY 90 tablet 1  . predniSONE (STERAPRED UNI-PAK 21 TAB) 5 MG (21) TBPK tablet Take 6 pills first day; 5 pills second day; 4 pills third day; 3 pills fourth day; 2 pills next day and 1 pill last day. 21 tablet 0   No current facility-administered medications for this visit.      Physical Exam  Blood pressure 125/71, pulse 62, height 5\' 11"  (1.803 m), weight 218 lb (  98.9 kg).  Constitutional: overall normal hygiene, normal nutrition, well developed, normal grooming, normal body habitus. Assistive device:none  Musculoskeletal: gait and station Limp none, muscle tone and strength are normal, no tremors or atrophy is present.  .  Neurological: coordination overall normal.  Deep tendon reflex/nerve stretch intact.  Sensation normal.  Cranial nerves II-XII intact.   Skin:   Normal overall no scars, lesions, ulcers or rashes. No psoriasis.  Psychiatric: Alert and oriented x 3.  Recent memory intact, remote memory unclear.  Normal mood and affect. Well groomed.  Good eye contact.  Cardiovascular: overall no swelling, no varicosities, no edema bilaterally, normal temperatures of the legs and arms, no clubbing, cyanosis and good capillary refill.  Lymphatic: palpation  is normal.  Examination of right Upper Extremity is done.  Inspection:   Overall:  Elbow non-tender without crepitus or defects, forearm non-tender without crepitus or defects, wrist non-tender without crepitus or defects, hand non-tender.    Shoulder: with glenohumeral joint tenderness, without effusion.   Upper arm: without swelling and tenderness   Range of motion:   Overall:  Full range of motion of the elbow, full range of motion of wrist and full range of motion in fingers.   Shoulder:  right  165 degrees forward flexion; 150 degrees abduction; 35 degrees internal rotation, 35 degrees external rotation, 15 degrees extension, 40 degrees adduction.   Stability:   Overall:  Shoulder, elbow and wrist stable   Strength and Tone:   Overall full shoulder muscles strength, full upper arm strength and normal upper arm bulk and tone.  All other systems reviewed and are negative   The patient has been educated about the nature of the problem(s) and counseled on treatment options.  The patient appeared to understand what I have discussed and is in agreement with it.  Encounter Diagnoses  Name Primary?  . Chronic right shoulder pain Yes  . Essential hypertension     PLAN Call if any problems.  Precautions discussed.  Continue current medications.   Return to clinic 3 months   I have reviewed the [place] web site prior to prescribing narcotic medicine for this patient.  Electronically Signed Sanjuana Kava, MD 2/13/20198:50 AM

## 2017-08-27 ENCOUNTER — Other Ambulatory Visit: Payer: Self-pay | Admitting: Orthopaedic Surgery

## 2017-08-27 MED ORDER — HYDROCODONE-ACETAMINOPHEN 7.5-325 MG PO TABS: 1.0000 | ORAL_TABLET | ORAL | 0 refills | Status: DC | PRN

## 2017-08-27 NOTE — Telephone Encounter (Signed)
Patient of Dr. Brooke Bonito requests refill on Hydrocodone/Acetaminophen 7.5-325  Mgs.  Qty  105  Sig: Take 1 tablet by mouth every 4 (four) hours as needed for moderate pain (Must last 30 days).  Patient states he uses [employer]

## 2017-09-15 ENCOUNTER — Encounter: Payer: Self-pay | Admitting: Orthopedic Surgery

## 2017-09-15 ENCOUNTER — Ambulatory Visit (INDEPENDENT_AMBULATORY_CARE_PROVIDER_SITE_OTHER): Payer: Medicare Other | Admitting: Orthopedic Surgery

## 2017-09-15 VITALS — BP 143/90 | HR 57 | Ht 71.0 in | Wt 224.0 lb

## 2017-09-15 DIAGNOSIS — G8929 Other chronic pain: Secondary | ICD-10-CM | POA: Diagnosis not present

## 2017-09-15 DIAGNOSIS — M25511 Pain in right shoulder: Secondary | ICD-10-CM | POA: Diagnosis not present

## 2017-09-15 DIAGNOSIS — M542 Cervicalgia: Secondary | ICD-10-CM | POA: Diagnosis not present

## 2017-09-15 NOTE — Progress Notes (Signed)
Progress Note   Patient ID: Andre Hunt, male   DOB: 08/09/1960, 57 y.o.   MRN: [id number]  Chief Complaint  Patient presents with  . Shoulder Pain    right shoulder pain getting worse     57 year old male with chronic shoulder pain presents for evaluation of acute onset of shoulder pain.  Patient says he was fine until he started to build a box for his dog and that was about 3 or 4 days ago and then he woke up with acute pain loss of motion with pain over the anterolateral deltoid right posterior anterior and lateral shoulder which was more severe than the hydrocodone 7.5 mg that he takes for his chronic pain    Review of Systems  Musculoskeletal: Positive for joint pain.  Neurological: Negative for tingling.   Current Meds  Medication Sig  . allopurinol (ZYLOPRIM) 300 MG tablet Take 1 tablet (300 mg total) by mouth daily.  Marland Kitchen atorvastatin (LIPITOR) 40 MG tablet Take 1 tablet (40 mg total) by mouth daily.  . cetirizine (ZYRTEC) 10 MG tablet Take 1 tablet (10 mg total) by mouth daily.  . colchicine 0.6 MG tablet One tid po for five days.  . diazepam (VALIUM) 5 MG tablet TAKE 1 TABLET EVERY 8 HOURS AS NEEDED FOR ANXIETY  . fluticasone (FLONASE) 50 MCG/ACT nasal spray Place 1 spray into both nostrils 2 (two) times daily as needed for allergies or rhinitis.  Marland Kitchen HYDROcodone-acetaminophen (NORCO) 7.5-325 MG tablet Take 1 tablet by mouth every 4 (four) hours as needed for moderate pain (Must last 30 days).  Marland Kitchen lisinopril (PRINIVIL,ZESTRIL) 10 MG tablet TAKE 1 TABLET DAILY    Past Medical History:  Diagnosis Date  . Allergy   . Gout   . Hyperlipidemia   . Personal history of colonic adenoma 09/28/2012     Allergies  Allergen Reactions  . Iohexol Anaphylaxis    Allergy to IVP dye    BP (!) 143/90   Pulse (!) 57   Ht 5\' 11"  (1.803 m)   Wt 224 lb (101.6 kg)   BMI 31.24 kg/m    Physical Exam  Constitutional: He is oriented to person, place, and time. He appears well-developed  and well-nourished.  Vital signs have been reviewed and are stable. Gen. appearance the patient is well-developed and well-nourished with normal grooming and hygiene.   Neurological: He is alert and oriented to person, place, and time.  Skin: Skin is warm and dry. No erythema.  Psychiatric: He has a normal mood and affect.  Vitals reviewed.   Right Shoulder Exam   Tenderness  The patient is experiencing tenderness in the acromion (Peri-acromial tenderness posteriorly and anteriorly laterally).  Range of Motion  Active abduction: abnormal  Passive abduction: abnormal  Extension: abnormal  External rotation: normal  Forward flexion: abnormal   Muscle Strength  The patient has normal right shoulder strength.  Tests  Apprehension: negative Hawkins test: negative Cross arm: negative Impingement: positive Drop arm: negative Sulcus: absent  Other  Erythema: absent Sensation: normal Pulse: present   Left Shoulder Exam  Left shoulder exam is normal.  Tenderness  The patient is experiencing no tenderness.   Range of Motion  The patient has normal left shoulder ROM.  Muscle Strength  The patient has normal left shoulder strength.  Tests  Apprehension: negative  Other  Erythema: absent Sensation: normal Pulse: present        Medical decision-making  Imaging: An x-ray was done several months ago showed  a calcium deposit no arthritis in the joint  Seems to have acute pain related to building of the box for his dog, was doing fine before that happened   Encounter Diagnoses  Name Primary?  . Chronic right shoulder pain   . Neck pain   . Acute pain of right shoulder Yes     Procedure note the subacromial injection shoulder RIGHT  Verbal consent was obtained to inject the  RIGHT   Shoulder  Timeout was completed to confirm the injection site is a subacromial space of the  RIGHT  shoulder   Medication used Depo-Medrol 40 mg and lidocaine 1% 3  cc  Anesthesia was provided by ethyl chloride  The injection was performed in the RIGHT  posterior subacromial space. After pinning the skin with alcohol and anesthetized the skin with ethyl chloride the subacromial space was injected using a 20-gauge needle. There were no complications  Sterile dressing was applied.    Patient will see Dr. Luna Glasgow on next month as scheduled   Arther Abbott, MD 09/15/2017 1:38 PM

## 2017-09-17 ENCOUNTER — Other Ambulatory Visit: Payer: Self-pay | Admitting: Orthopedic Surgery

## 2017-09-17 ENCOUNTER — Telehealth: Payer: Self-pay | Admitting: Orthopedic Surgery

## 2017-09-17 MED ORDER — PREDNISONE 10 MG (48) PO TBPK: ORAL_TABLET | Freq: Every day | ORAL | 1 refills | Status: DC

## 2017-09-17 NOTE — Telephone Encounter (Signed)
Andre Hunt called and stated that he was under the impression that he was going to be given a prescription for Prednisone.  He checked with [facility] but they did not have this on file for him.  Please advise

## 2017-09-17 NOTE — Telephone Encounter (Signed)
Ok just sent

## 2017-09-17 NOTE — Telephone Encounter (Signed)
Called patient/ apologized for oversight, told him it should be available at pharmacy.

## 2017-09-21 ENCOUNTER — Other Ambulatory Visit: Payer: Self-pay | Admitting: Nurse Practitioner

## 2017-09-21 DIAGNOSIS — E785 Hyperlipidemia, unspecified: Secondary | ICD-10-CM

## 2017-10-01 ENCOUNTER — Other Ambulatory Visit: Payer: Self-pay | Admitting: Orthopaedic Surgery

## 2017-10-01 MED ORDER — HYDROCODONE-ACETAMINOPHEN 7.5-325 MG PO TABS: 1.0000 | ORAL_TABLET | ORAL | 0 refills | Status: DC | PRN

## 2017-10-01 NOTE — Telephone Encounter (Signed)
Hydrocodone-Acetaminophen  7.5/325 MG  Qty 105 Tablets  Take 1 tablet by mouth every 4 (four) hours as needed for moderate pain (Must last 30 days).  PATIENT USES [facility]

## 2017-10-27 ENCOUNTER — Other Ambulatory Visit: Payer: Self-pay | Admitting: Orthopaedic Surgery

## 2017-10-28 ENCOUNTER — Encounter: Payer: Self-pay | Admitting: Orthopaedic Surgery

## 2017-10-28 ENCOUNTER — Ambulatory Visit (INDEPENDENT_AMBULATORY_CARE_PROVIDER_SITE_OTHER): Payer: Medicare Other | Admitting: Orthopaedic Surgery

## 2017-10-28 VITALS — BP 125/73 | HR 63 | Ht 71.0 in | Wt 218.0 lb

## 2017-10-28 DIAGNOSIS — G8929 Other chronic pain: Secondary | ICD-10-CM

## 2017-10-28 DIAGNOSIS — M25511 Pain in right shoulder: Secondary | ICD-10-CM | POA: Diagnosis not present

## 2017-10-28 DIAGNOSIS — M542 Cervicalgia: Secondary | ICD-10-CM | POA: Diagnosis not present

## 2017-10-28 DIAGNOSIS — I10 Essential (primary) hypertension: Secondary | ICD-10-CM

## 2017-10-28 NOTE — Progress Notes (Signed)
Patient Andre Hunt, male DOB:April 28, 1961, 57 y.o. BPZ:[id number]  Chief Complaint  Patient presents with  . Follow-up    Chronic right shoulder pain    HPI  TRAMELL PIECHOTA is a 57 y.o. male who has right shoulder pain.  He saw Dr. Aline Brochure in my absence last month for an injection in the right shoulder.  The injection helped.  He has pain still but not as much.  The rainy weather we have had made it worse again. He has no new trauma, no redness, no effusion.  He is doing his exercises.  He has more pain at night. HPI  Body mass index is 30.4 kg/m.  ROS  Review of Systems  HENT: Negative for congestion.   Respiratory: Negative for cough and shortness of breath.   Cardiovascular: Negative for chest pain and leg swelling.  Endocrine: Positive for cold intolerance.  Musculoskeletal: Positive for arthralgias and joint swelling.  Allergic/Immunologic: Positive for environmental allergies.  Neurological: Negative for numbness.  All other systems reviewed and are negative.   Past Medical History:  Diagnosis Date  . Allergy   . Gout   . Hyperlipidemia   . Personal history of colonic adenoma 09/28/2012    Past Surgical History:  Procedure Laterality Date  . APPENDECTOMY  1988  . CARPAL TUNNEL RELEASE     bilateral 2000 and 2001  . COLONOSCOPY    . ELBOW SURGERY  2006   muscle repair  . EXPLORATORY LAPAROTOMY  1996   after falling on scissors; repair of "hole in intestines"  . [place]  . UMBILICAL HERNIA REPAIR  1968    Family History  Problem Relation Age of Onset  . Kidney cancer Mother   . Diabetes Mother   . COPD Mother   . Anuerysm Father   . Diabetes Father   . Heart disease Father   . Diabetes Sister   . Colon cancer Neg Hx     Social History Social History   Tobacco Use  . Smoking status: Never Smoker  . Smokeless tobacco: Current User    Types: Snuff  Substance Use Topics  . Alcohol use: No  . Drug use: No     Allergies  Allergen Reactions  . Iohexol Anaphylaxis    Allergy to IVP dye    Current Outpatient Medications  Medication Sig Dispense Refill  . allopurinol (ZYLOPRIM) 300 MG tablet Take 1 tablet (300 mg total) by mouth daily. 30 tablet 5  . atorvastatin (LIPITOR) 40 MG tablet TAKE 1 TABLET DAILY 90 tablet 0  . cetirizine (ZYRTEC) 10 MG tablet TAKE 1 TABLET DAILY 30 tablet 0  . colchicine 0.6 MG tablet One tid po for five days. 15 tablet 5  . diazepam (VALIUM) 5 MG tablet TAKE 1 TABLET EVERY 8 HOURS AS NEEDED FOR ANXIETY 60 tablet 0  . fluticasone (FLONASE) 50 MCG/ACT nasal spray Place 1 spray into both nostrils 2 (two) times daily as needed for allergies or rhinitis. 16 g 6  . HYDROcodone-acetaminophen (NORCO) 7.5-325 MG tablet Take 1 tablet by mouth every 4 (four) hours as needed for moderate pain (Must last 30 days). 105 tablet 0  . lisinopril (PRINIVIL,ZESTRIL) 10 MG tablet TAKE 1 TABLET DAILY 90 tablet 1  . predniSONE (STERAPRED UNI-PAK 48 TAB) 10 MG (48) TBPK tablet Take by mouth daily. 12 day ds as directed 48 tablet 1   No current facility-administered medications for this visit.      Physical Exam  Blood pressure 125/73, pulse 63, height 5\' 11"  (1.803 m), weight 218 lb (98.9 kg).  Constitutional: overall normal hygiene, normal nutrition, well developed, normal grooming, normal body habitus. Assistive device:none  Musculoskeletal: gait and station Limp none, muscle tone and strength are normal, no tremors or atrophy is present.  .  Neurological: coordination overall normal.  Deep tendon reflex/nerve stretch intact.  Sensation normal.  Cranial nerves II-XII intact.   Skin:   Normal overall no scars, lesions, ulcers or rashes. No psoriasis.  Psychiatric: Alert and oriented x 3.  Recent memory intact, remote memory unclear.  Normal mood and affect. Well groomed.  Good eye contact.  Cardiovascular: overall no swelling, no varicosities, no edema bilaterally, normal  temperatures of the legs and arms, no clubbing, cyanosis and good capillary refill.  Lymphatic: palpation is normal.  Examination of right Upper Extremity is done.  Inspection:   Overall:  Elbow non-tender without crepitus or defects, forearm non-tender without crepitus or defects, wrist non-tender without crepitus or defects, hand non-tender.    Shoulder: with glenohumeral joint tenderness, without effusion.   Upper arm: without swelling and tenderness   Range of motion:   Overall:  Full range of motion of the elbow, full range of motion of wrist and full range of motion in fingers.   Shoulder:  right  165 degrees forward flexion; 145 degrees abduction; 30 degrees internal rotation, 30 degrees external rotation, 10 degrees extension, 40 degrees adduction.   Stability:   Overall:  Shoulder, elbow and wrist stable   Strength and Tone:   Overall full shoulder muscles strength, full upper arm strength and normal upper arm bulk and tone. All other systems reviewed and are negative   The patient has been educated about the nature of the problem(s) and counseled on treatment options.  The patient appeared to understand what I have discussed and is in agreement with it.  Encounter Diagnoses  Name Primary?  . Chronic right shoulder pain Yes  . Neck pain   . Essential hypertension     PLAN Call if any problems.  Precautions discussed.  Continue current medications.   Return to clinic 3 months   Electronically Signed Sanjuana Kava, MD 5/15/20198:48 AM

## 2017-11-02 ENCOUNTER — Telehealth: Payer: Self-pay | Admitting: Orthopaedic Surgery

## 2017-11-02 NOTE — Telephone Encounter (Signed)
Hydrocodone-Acetaminophen  7.5/325 mg  Qty 105 Tablets ° °PATIENT USES [facility] °

## 2017-11-03 MED ORDER — HYDROCODONE-ACETAMINOPHEN 7.5-325 MG PO TABS: 1.0000 | ORAL_TABLET | ORAL | 0 refills | Status: DC | PRN

## 2017-12-07 ENCOUNTER — Other Ambulatory Visit: Payer: Self-pay | Admitting: Orthopaedic Surgery

## 2017-12-07 ENCOUNTER — Telehealth: Payer: Self-pay | Admitting: Orthopaedic Surgery

## 2017-12-07 NOTE — Telephone Encounter (Signed)
Hydrocodone-Acetaminophen  7.5/325 mg  Qty 105 Tablets ° °PATIENT USES [facility] °

## 2017-12-08 ENCOUNTER — Telehealth: Payer: Self-pay | Admitting: Nurse Practitioner

## 2017-12-08 MED ORDER — HYDROCODONE-ACETAMINOPHEN 7.5-325 MG PO TABS: 1.0000 | ORAL_TABLET | ORAL | 0 refills | Status: DC | PRN

## 2017-12-08 NOTE — Telephone Encounter (Signed)
Appt made for tick bite

## 2017-12-10 ENCOUNTER — Ambulatory Visit (INDEPENDENT_AMBULATORY_CARE_PROVIDER_SITE_OTHER): Payer: Medicare Other | Admitting: Nurse Practitioner

## 2017-12-10 ENCOUNTER — Encounter: Payer: Self-pay | Admitting: Nurse Practitioner

## 2017-12-10 VITALS — BP 107/75 | HR 62 | Temp 96.9°F | Ht 71.0 in | Wt 217.0 lb

## 2017-12-10 DIAGNOSIS — R6889 Other general symptoms and signs: Secondary | ICD-10-CM | POA: Diagnosis not present

## 2017-12-10 DIAGNOSIS — S30861A Insect bite (nonvenomous) of abdominal wall, initial encounter: Secondary | ICD-10-CM

## 2017-12-10 DIAGNOSIS — I10 Essential (primary) hypertension: Secondary | ICD-10-CM | POA: Diagnosis not present

## 2017-12-10 DIAGNOSIS — W57XXXA Bitten or stung by nonvenomous insect and other nonvenomous arthropods, initial encounter: Secondary | ICD-10-CM

## 2017-12-10 MED ORDER — DOXYCYCLINE HYCLATE 100 MG PO TABS: 100.0000 mg | ORAL_TABLET | Freq: Two times a day (BID) | ORAL | 0 refills | Status: DC

## 2017-12-10 NOTE — Progress Notes (Signed)
   Subjective:    Patient ID: Andre Hunt, male    DOB: 1960/09/12, 57 y.o.   MRN: [id number]   Chief Complaint: Tick Removal (on side) and Foot Pain   HPI Patient come sin today c/o removing a tick from left flank. He is not sure how long tick was on there but he is sure it was less then a day. Area of removal was red and puffed up. His only complaint is ftaigue.   Review of Systems  Constitutional: Positive for fatigue.  HENT: Negative.   Respiratory: Negative.   Cardiovascular: Negative.   Skin: Positive for rash.  Neurological: Negative.   Psychiatric/Behavioral: Negative.   All other systems reviewed and are negative.      Objective:   Physical Exam  Constitutional: He is oriented to person, place, and time. He appears well-developed and well-nourished.  Neck: Normal range of motion. Neck supple.  Cardiovascular: Normal rate and regular rhythm.  Pulmonary/Chest: Effort normal.  Abdominal: Soft.  Neurological: He is alert and oriented to person, place, and time.  Skin: Skin is warm.  1cm papular area without erythema on left flank  Psychiatric: He has a normal mood and affect. His behavior is normal. Judgment and thought content normal.   BP 107/75   Pulse 62   Temp (!) 96.9 F (36.1 C) (Oral)   Ht '5\' 11"'$  (1.803 m)   Wt 217 lb (98.4 kg)   BMI 30.27 kg/m       Assessment & Plan:  Andre Hunt in today with chief complaint of Tick Removal (on side) and Foot Pain   1. Tick bite of left flank, initial encounter Watch area - CMP14+EGFR - Lyme Ab/Western Blot Reflex - Rocky mtn spotted fvr abs pnl(IgG+IgM) - doxycycline (VIBRA-TABS) 100 MG tablet; Take 1 tablet (100 mg total) by mouth 2 (two) times daily. 1 po bid  Dispense: 20 tablet; Refill: 0  Mary-Margaret Hassell Done, FNP

## 2017-12-10 NOTE — Patient Instructions (Signed)

## 2017-12-14 LAB — CMP14+EGFR
ALBUMIN: 4.4 g/dL (ref 3.5–5.5)
ALK PHOS: 54 IU/L (ref 39–117)
ALT: 48 IU/L — ABNORMAL HIGH (ref 0–44)
AST: 28 IU/L (ref 0–40)
Albumin/Globulin Ratio: 2.1 (ref 1.2–2.2)
BILIRUBIN TOTAL: 0.4 mg/dL (ref 0.0–1.2)
BUN / CREAT RATIO: 15 (ref 9–20)
BUN: 16 mg/dL (ref 6–24)
CHLORIDE: 105 mmol/L (ref 96–106)
CO2: 21 mmol/L (ref 20–29)
Calcium: 9 mg/dL (ref 8.7–10.2)
Creatinine, Ser: 1.05 mg/dL (ref 0.76–1.27)
GFR calc Af Amer: 91 mL/min/{1.73_m2} (ref >59)
GFR calc non Af Amer: 79 mL/min/{1.73_m2} (ref >59)
GLOBULIN, TOTAL: 2.1 g/dL (ref 1.5–4.5)
GLUCOSE: 113 mg/dL — AB (ref 65–99)
Potassium: 4.5 mmol/L (ref 3.5–5.2)
SODIUM: 137 mmol/L (ref 134–144)
Total Protein: 6.5 g/dL (ref 6.0–8.5)

## 2017-12-14 LAB — RMSF, IGG, IFA: RMSF, IGG, IFA: 1:64 {titer} — ABNORMAL HIGH

## 2017-12-14 LAB — LYME AB/WESTERN BLOT REFLEX

## 2017-12-14 LAB — ROCKY MTN SPOTTED FVR ABS PNL(IGG+IGM)
RMSF IgG: POSITIVE — AB
RMSF IgM: 0.26 index (ref 0.00–0.89)

## 2017-12-18 ENCOUNTER — Telehealth: Payer: Self-pay | Admitting: Nurse Practitioner

## 2017-12-18 NOTE — Telephone Encounter (Signed)
Spoke with husband in regards to wifes pain medication. See wifes chart

## 2017-12-18 NOTE — Telephone Encounter (Signed)
Routed to [facility]

## 2017-12-28 ENCOUNTER — Other Ambulatory Visit: Payer: Self-pay | Admitting: Nurse Practitioner

## 2017-12-28 DIAGNOSIS — E785 Hyperlipidemia, unspecified: Secondary | ICD-10-CM

## 2017-12-28 NOTE — Telephone Encounter (Signed)
Last lipid 11/03/16  MMM

## 2018-01-11 ENCOUNTER — Telehealth: Payer: Self-pay | Admitting: Orthopaedic Surgery

## 2018-01-11 MED ORDER — HYDROCODONE-ACETAMINOPHEN 7.5-325 MG PO TABS: 1.0000 | ORAL_TABLET | ORAL | 0 refills | Status: DC | PRN

## 2018-01-11 NOTE — Telephone Encounter (Signed)
Hydrocodone-Acetaminophen  7.5/325 mg  Qty 105 Tablets ° °PATIENT USES [facility] °

## 2018-01-20 ENCOUNTER — Other Ambulatory Visit: Payer: Self-pay | Admitting: Orthopaedic Surgery

## 2018-01-21 ENCOUNTER — Encounter: Payer: Self-pay | Admitting: Orthopaedic Surgery

## 2018-01-21 ENCOUNTER — Ambulatory Visit (INDEPENDENT_AMBULATORY_CARE_PROVIDER_SITE_OTHER): Payer: Medicare Other | Admitting: Orthopaedic Surgery

## 2018-01-21 VITALS — BP 121/74 | HR 63 | Temp 97.2°F | Ht 68.0 in | Wt 214.0 lb

## 2018-01-21 DIAGNOSIS — M25511 Pain in right shoulder: Secondary | ICD-10-CM | POA: Diagnosis not present

## 2018-01-21 DIAGNOSIS — G8929 Other chronic pain: Secondary | ICD-10-CM

## 2018-01-21 DIAGNOSIS — M542 Cervicalgia: Secondary | ICD-10-CM

## 2018-01-21 NOTE — Progress Notes (Signed)
Patient [id number] Andre Hunt, male DOB:[birth year], 57 y.o. GYI:[id number]  Chief Complaint  Patient presents with  . Shoulder Pain    right     HPI  Andre Hunt is a 57 y.o. male who has chronic right shoulder pain and right tennis elbow.  He has pain that varies with the weather and what he does.  He does better in the warmer weather.  He has no new trauma.  I called in his pain medicine recently.  He is doing his exercises.  He knows his limitiations.   Body mass index is 32.54 kg/m.  ROS  Review of Systems  HENT: Negative for congestion.   Respiratory: Negative for cough and shortness of breath.   Cardiovascular: Negative for chest pain and leg swelling.  Endocrine: Positive for cold intolerance.  Musculoskeletal: Positive for arthralgias and joint swelling.  Allergic/Immunologic: Positive for environmental allergies.  Neurological: Negative for numbness.  All other systems reviewed and are negative.   All other systems reviewed and are negative.  Past Medical History:  Diagnosis Date  . Allergy   . Gout   . Hyperlipidemia   . Personal history of colonic adenoma 09/28/2012    Past Surgical History:  Procedure Laterality Date  . APPENDECTOMY  1988  . CARPAL TUNNEL RELEASE     bilateral 2000 and 2001  . COLONOSCOPY    . ELBOW SURGERY  2006   muscle repair  . EXPLORATORY LAPAROTOMY  1996   after falling on scissors; repair of "hole in intestines"  . [place]  . UMBILICAL HERNIA REPAIR  1968    Family History  Problem Relation Age of Onset  . Kidney cancer Mother   . Diabetes Mother   . COPD Mother   . Anuerysm Father   . Diabetes Father   . Heart disease Father   . Diabetes Sister   . Colon cancer Neg Hx     Social History Social History   Tobacco Use  . Smoking status: Never Smoker  . Smokeless tobacco: Current User    Types: Snuff  Substance Use Topics  . Alcohol use: No  . Drug use: No    Allergies  Allergen  Reactions  . Iohexol Anaphylaxis    Allergy to IVP dye    Current Outpatient Medications  Medication Sig Dispense Refill  . allopurinol (ZYLOPRIM) 300 MG tablet Take 1 tablet (300 mg total) by mouth daily. 30 tablet 5  . atorvastatin (LIPITOR) 40 MG tablet TAKE 1 TABLET DAILY 90 tablet 0  . cetirizine (ZYRTEC) 10 MG tablet TAKE 1 TABLET DAILY 30 tablet 0  . colchicine 0.6 MG tablet One tid po for five days. 15 tablet 5  . diazepam (VALIUM) 5 MG tablet TAKE 1 TABLET EVERY 8 HOURS AS NEEDED FOR ANXIETY 60 tablet 0  . fluticasone (FLONASE) 50 MCG/ACT nasal spray Place 1 spray into both nostrils 2 (two) times daily as needed for allergies or rhinitis. 16 g 6  . HYDROcodone-acetaminophen (NORCO) 7.5-325 MG tablet Take 1 tablet by mouth every 4 (four) hours as needed for moderate pain (Must last 30 days). 105 tablet 0  . lisinopril (PRINIVIL,ZESTRIL) 10 MG tablet TAKE 1 TABLET DAILY 90 tablet 1  . predniSONE (STERAPRED UNI-PAK 48 TAB) 10 MG (48) TBPK tablet Take by mouth daily. 12 day ds as directed 48 tablet 1  . doxycycline (VIBRA-TABS) 100 MG tablet Take 1 tablet (100 mg total) by mouth 2 (two) times daily. 1 po bid (  Patient not taking: Reported on 01/21/2018) 20 tablet 0   No current facility-administered medications for this visit.      Physical Exam  Blood pressure 121/74, pulse 63, temperature (!) 97.2 F (36.2 C), height 5\' 8"  (1.727 m), weight 214 lb (97.1 kg).  Constitutional: overall normal hygiene, normal nutrition, well developed, normal grooming, normal body habitus. Assistive device:none  Musculoskeletal: gait and station Limp none, muscle tone and strength are normal, no tremors or atrophy is present.  .  Neurological: coordination overall normal.  Deep tendon reflex/nerve stretch intact.  Sensation normal.  Cranial nerves II-XII intact.   Skin:   Normal overall no scars, lesions, ulcers or rashes. No psoriasis.  Psychiatric: Alert and oriented x 3.  Recent memory intact,  remote memory unclear.  Normal mood and affect. Well groomed.  Good eye contact.  Cardiovascular: overall no swelling, no varicosities, no edema bilaterally, normal temperatures of the legs and arms, no clubbing, cyanosis and good capillary refill.  Lymphatic: palpation is normal.  Right shoulder has pain in the extremes of motion, NV intact.  His right elbow is tender over the lateral epicondyle but has full motion.  Grips are normal today.  All other systems reviewed and are negative   The patient has been educated about the nature of the problem(s) and counseled on treatment options.  The patient appeared to understand what I have discussed and is in agreement with it.  Encounter Diagnoses  Name Primary?  . Chronic right shoulder pain Yes  . Neck pain     PLAN Call if any problems.  Precautions discussed.  Continue current medications.   Return to clinic 3 months   Electronically Signed Sanjuana Kava, MD 8/8/20198:21 AM

## 2018-01-27 ENCOUNTER — Other Ambulatory Visit: Payer: Self-pay | Admitting: Nurse Practitioner

## 2018-01-27 DIAGNOSIS — I10 Essential (primary) hypertension: Secondary | ICD-10-CM

## 2018-02-10 ENCOUNTER — Telehealth: Payer: Self-pay | Admitting: Orthopaedic Surgery

## 2018-02-10 MED ORDER — HYDROCODONE-ACETAMINOPHEN 7.5-325 MG PO TABS: 1.0000 | ORAL_TABLET | ORAL | 0 refills | Status: DC | PRN

## 2018-02-10 MED ORDER — DIAZEPAM 5 MG PO TABS: ORAL_TABLET | ORAL | 2 refills | Status: DC

## 2018-02-10 NOTE — Telephone Encounter (Signed)
Patient requests refill on Hydrocodone/Acetaminophen 7.5-325  Mgs.  Qty  105  Sig: Take 1 tablet by mouth every 4 (four) hours as needed for moderate pain (Must last 30 days).  Patient states he uses [facility]

## 2018-02-10 NOTE — Telephone Encounter (Signed)
Patient requests refill on Diazepam(Valium) 5 mgs.  Qty 60       Sig: TAKE 1 TABLET EVERY 8 HOURS AS NEEDED FOR ANXIETY   Patient states he uses [facility]

## 2018-02-16 ENCOUNTER — Other Ambulatory Visit: Payer: Self-pay | Admitting: Nurse Practitioner

## 2018-03-09 ENCOUNTER — Ambulatory Visit: Payer: Medicare Other | Admitting: *Deleted

## 2018-03-11 ENCOUNTER — Telehealth: Payer: Self-pay | Admitting: Nurse Practitioner

## 2018-03-11 ENCOUNTER — Other Ambulatory Visit: Payer: Self-pay | Admitting: Pediatrics

## 2018-03-11 DIAGNOSIS — E785 Hyperlipidemia, unspecified: Secondary | ICD-10-CM

## 2018-03-11 MED ORDER — ATORVASTATIN CALCIUM 40 MG PO TABS
40.0000 mg | ORAL_TABLET | Freq: Every day | ORAL | 0 refills | Status: DC
Start: 2018-03-11 — End: 2018-04-20

## 2018-03-11 NOTE — Telephone Encounter (Signed)
Pt appt made for 10/4 and #30 of chiolesterol med sent in - pt aware

## 2018-03-11 NOTE — Telephone Encounter (Signed)
Last lipid 11/03/16

## 2018-03-13 ENCOUNTER — Other Ambulatory Visit: Payer: Self-pay | Admitting: Orthopaedic Surgery

## 2018-03-18 ENCOUNTER — Telehealth: Payer: Self-pay | Admitting: Orthopaedic Surgery

## 2018-03-18 MED ORDER — HYDROCODONE-ACETAMINOPHEN 7.5-325 MG PO TABS: ORAL_TABLET | ORAL | 0 refills | Status: DC

## 2018-03-18 NOTE — Telephone Encounter (Signed)
Hydrocodone-Acetaminophen  7.5/325 mg  Qty 105 Tablets ° °PATIENT USES [facility] °

## 2018-03-19 ENCOUNTER — Ambulatory Visit (INDEPENDENT_AMBULATORY_CARE_PROVIDER_SITE_OTHER): Payer: Medicare Other | Admitting: Nurse Practitioner

## 2018-03-19 ENCOUNTER — Encounter: Payer: Self-pay | Admitting: Nurse Practitioner

## 2018-03-19 VITALS — BP 112/77 | HR 56 | Temp 97.0°F | Ht 68.0 in | Wt 219.0 lb

## 2018-03-19 DIAGNOSIS — R739 Hyperglycemia, unspecified: Secondary | ICD-10-CM | POA: Diagnosis not present

## 2018-03-19 DIAGNOSIS — M1A00X0 Idiopathic chronic gout, unspecified site, without tophus (tophi): Secondary | ICD-10-CM

## 2018-03-19 DIAGNOSIS — Z6834 Body mass index (BMI) 34.0-34.9, adult: Secondary | ICD-10-CM

## 2018-03-19 DIAGNOSIS — E785 Hyperlipidemia, unspecified: Secondary | ICD-10-CM | POA: Diagnosis not present

## 2018-03-19 DIAGNOSIS — I10 Essential (primary) hypertension: Secondary | ICD-10-CM

## 2018-03-19 LAB — BAYER DCA HB A1C WAIVED: HB A1C (BAYER DCA - WAIVED): 5.6 % (ref <7.0)

## 2018-03-19 MED ORDER — DIAZEPAM 5 MG PO TABS: ORAL_TABLET | ORAL | 2 refills | Status: DC

## 2018-03-19 MED ORDER — ALLOPURINOL 300 MG PO TABS: 300.0000 mg | ORAL_TABLET | Freq: Every day | ORAL | 5 refills | Status: DC

## 2018-03-19 MED ORDER — LISINOPRIL 10 MG PO TABS: 10.0000 mg | ORAL_TABLET | Freq: Every day | ORAL | 1 refills | Status: DC

## 2018-03-19 NOTE — Progress Notes (Signed)
Subjective:    Patient ID: Andre Hunt, male    DOB: 1960-11-10, 57 y.o.   MRN: [id number]   Chief Complaint: Medical Management of Chronic Issues   HPI:  1. Hyperlipidemia with target LDL less than 100  does not really watch diet and does no exercise  2. Elevated blood sugar  Blood sugar in the past was 113. hgba1c was not Hunt as ordered. He does not check blood sugars at home   3. Essential hypertension  No c/o chest pain, sob or headache. Does not check blood pressure at home.  4. Idiopathic chronic gout without tophus, unspecified site  Has had no recent gout flare ups.  5. BMI 34.0-34.9,adult  Weight is up 5lbs    Outpatient Encounter Medications as of 03/19/2018  Medication Sig  . atorvastatin (LIPITOR) 40 MG tablet Take 1 tablet (40 mg total) by mouth daily.  . cetirizine (ZYRTEC) 10 MG tablet TAKE 1 TABLET DAILY  . HYDROcodone-acetaminophen (NORCO) 7.5-325 MG tablet TAKE 1 TABLET EVERY 4 HOURS AS NEEDED FOR MODERATE PAIN  . lisinopril (PRINIVIL,ZESTRIL) 10 MG tablet TAKE 1 TABLET DAILY  . [DISCONTINUED] fluticasone (FLONASE) 50 MCG/ACT nasal spray Place 1 spray into both nostrils 2 (two) times daily as needed for allergies or rhinitis.  Marland Kitchen allopurinol (ZYLOPRIM) 300 MG tablet Take 1 tablet (300 mg total) by mouth daily. (Patient not taking: Reported on 03/19/2018)  . colchicine 0.6 MG tablet One tid po for five days. (Patient not taking: Reported on 03/19/2018)  . diazepam (VALIUM) 5 MG tablet One tablet as needed twice a day for spasm, arm pain. (Patient not taking: Reported on 03/19/2018)       New complaints: None today  Social history: Is on disability. His wife has lots of medical problems and he has to take care of her alot.  Review of Systems  Constitutional: Negative for activity change and appetite change.  HENT: Negative.   Eyes: Negative for pain.  Respiratory: Negative for shortness of breath.   Cardiovascular: Negative for chest pain,  palpitations and leg swelling.  Gastrointestinal: Negative for abdominal pain.  Endocrine: Negative for polydipsia.  Genitourinary: Negative.   Skin: Negative for rash.  Neurological: Negative for dizziness, weakness and headaches.  Hematological: Does not bruise/bleed easily.  Psychiatric/Behavioral: Negative.   All other systems reviewed and are negative.      Objective:   Physical Exam  Constitutional: He is oriented to person, place, and time. He appears well-developed and well-nourished.  HENT:  Head: Normocephalic.  Nose: Nose normal.  Mouth/Throat: Oropharynx is clear and moist.  Eyes: Pupils are equal, round, and reactive to light. EOM are normal.  Neck: Normal range of motion and phonation normal. Neck supple. No JVD present. Carotid bruit is not present. No thyroid mass and no thyromegaly present.  Cardiovascular: Normal rate and regular rhythm.  Pulmonary/Chest: Effort normal and breath sounds normal. No respiratory distress.  Abdominal: Soft. Normal appearance, normal aorta and bowel sounds are normal. There is no tenderness.  Musculoskeletal: Normal range of motion.  Lymphadenopathy:    He has no cervical adenopathy.  Neurological: He is alert and oriented to person, place, and time.  Skin: Skin is warm and dry.  Psychiatric: He has a normal mood and affect. His behavior is normal. Judgment and thought content normal.  Nursing note and vitals reviewed.   BP 112/77   Pulse (!) 56   Temp (!) 97 F (36.1 C) (Oral)   Ht _0  (1.727 m)  Wt 219 lb (99.3 kg)   BMI 33.30 kg/m       Assessment & Plan:  Andre Hunt comes in today with chief complaint of Medical Management of Chronic Issues   Diagnosis and orders addressed:  1. Hyperlipidemia with target LDL less than 100 Low fat diet - Lipid panel  2. Elevated blood sugar Watch carbs in diet - Bayer DCA Hb A1c Waived  3. Essential hypertension Low sodium diet - CMP14+EGFR - lisinopril  (PRINIVIL,ZESTRIL) 10 MG tablet; Take 1 tablet (10 mg total) by mouth daily.  Dispense: 90 tablet; Refill: 1  4. 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  Dispense: 30 tablet; Refill: 5  5. BMI 34.0-34.9,adult Discussed diet and exercise for person with BMI >25 Will recheck weight in 3-6 months    Labs pending Health Maintenance reviewed Diet and exercise encouraged  Follow up plan: 6 months   Andre Hassell Done, FNP

## 2018-03-20 LAB — CMP14+EGFR
ALBUMIN: 4.1 g/dL (ref 3.5–5.5)
ALK PHOS: 64 IU/L (ref 39–117)
ALT: 42 IU/L (ref 0–44)
AST: 29 IU/L (ref 0–40)
Albumin/Globulin Ratio: 1.9 (ref 1.2–2.2)
BILIRUBIN TOTAL: 0.4 mg/dL (ref 0.0–1.2)
BUN / CREAT RATIO: 14 (ref 9–20)
BUN: 16 mg/dL (ref 6–24)
CHLORIDE: 101 mmol/L (ref 96–106)
CO2: 22 mmol/L (ref 20–29)
CREATININE: 1.15 mg/dL (ref 0.76–1.27)
Calcium: 9 mg/dL (ref 8.7–10.2)
GFR calc Af Amer: 82 mL/min/{1.73_m2} (ref >59)
GFR calc non Af Amer: 71 mL/min/{1.73_m2} (ref >59)
GLUCOSE: 107 mg/dL — AB (ref 65–99)
Globulin, Total: 2.2 g/dL (ref 1.5–4.5)
Potassium: 4.7 mmol/L (ref 3.5–5.2)
Sodium: 142 mmol/L (ref 134–144)
Total Protein: 6.3 g/dL (ref 6.0–8.5)

## 2018-03-20 LAB — LIPID PANEL
CHOLESTEROL TOTAL: 101 mg/dL (ref 100–199)
Chol/HDL Ratio: 2.7 ratio (ref 0.0–5.0)
HDL: 37 mg/dL — ABNORMAL LOW (ref >39)
LDL CALC: 44 mg/dL (ref 0–99)
TRIGLYCERIDES: 100 mg/dL (ref 0–149)
VLDL CHOLESTEROL CAL: 20 mg/dL (ref 5–40)

## 2018-03-29 ENCOUNTER — Telehealth: Payer: Self-pay | Admitting: Nurse Practitioner

## 2018-03-29 NOTE — Telephone Encounter (Signed)
Patient notified of lab results

## 2018-03-31 ENCOUNTER — Ambulatory Visit: Payer: Medicare Other | Admitting: *Deleted

## 2018-04-02 ENCOUNTER — Encounter: Payer: Self-pay | Admitting: Pediatrics

## 2018-04-02 ENCOUNTER — Ambulatory Visit (INDEPENDENT_AMBULATORY_CARE_PROVIDER_SITE_OTHER): Payer: Medicare Other | Admitting: Pediatrics

## 2018-04-02 VITALS — BP 117/76 | HR 70 | Temp 97.3°F | Ht 68.0 in | Wt 218.8 lb

## 2018-04-02 DIAGNOSIS — R109 Unspecified abdominal pain: Secondary | ICD-10-CM | POA: Diagnosis not present

## 2018-04-02 DIAGNOSIS — R112 Nausea with vomiting, unspecified: Secondary | ICD-10-CM

## 2018-04-02 LAB — MICROSCOPIC EXAMINATION
BACTERIA UA: NONE SEEN
RBC MICROSCOPIC, UA: NONE SEEN /HPF (ref 0–2)
Renal Epithel, UA: NONE SEEN /hpf

## 2018-04-02 LAB — URINALYSIS, COMPLETE
BILIRUBIN UA: NEGATIVE
GLUCOSE, UA: NEGATIVE
Leukocytes, UA: NEGATIVE
NITRITE UA: NEGATIVE
RBC UA: NEGATIVE
Urobilinogen, Ur: 0.2 mg/dL (ref 0.2–1.0)
pH, UA: 5 (ref 5.0–7.5)

## 2018-04-02 MED ORDER — PROMETHAZINE HCL 12.5 MG PO TABS: 12.5000 mg | ORAL_TABLET | Freq: Three times a day (TID) | ORAL | 0 refills | Status: DC | PRN

## 2018-04-02 NOTE — Progress Notes (Signed)
Subjective:   Patient ID: Andre Hunt, male    DOB: [birth year], 57 y.o.   MRN: [id number] CC: Fatigue; Nausea; Emesis; Abdominal Pain; and Diarrhea  HPI: Andre Hunt is a 57 y.o. male   5 days ago ate out for dinner, wife thought the meat he had looked pink and still frozen in the middle.  Starting later that evening into the next day he has been having abdominal cramping off and on, nausea with some vomiting mostly dry heaves, loose stools.  Has loose bowel movements about 3-4 times a day.  This is not been improving over the last few days.  Still had dry heaves this morning when he woke up.  No blood in his stool or emesis.  He has been bitten by ticks over the summer, asked about alpha gal allergy.  He has a symptoms even when he has not had mammalian meat.  For the last 5 days he said the symptoms daily, happens both in the morning in the afternoon.  Not just related to food.  Symptoms get better within a few minutes to an hour or so, sometimes go completely away, but then come back.  Last bowel movement was this morning.  No fevers.  Feeling fatigued.  Able to keep some food down. Has been drinking lots of fluids.  Urinating normally.  No dysuria.  Relevant past medical, surgical, family and social history reviewed. Allergies and medications reviewed and updated. Social History   Tobacco Use  Smoking Status Never Smoker  Smokeless Tobacco Current User  . Types: Snuff   ROS: Per HPI   Objective:    BP 117/76   Pulse 70   Temp (!) 97.3 F (36.3 C) (Oral)   Ht 5' 8" (1.727 m)   Wt 218 lb 12.8 oz (99.2 kg)   BMI 33.27 kg/m   Wt Readings from Last 3 Encounters:  04/02/18 218 lb 12.8 oz (99.2 kg)  03/19/18 219 lb (99.3 kg)  01/21/18 214 lb (97.1 kg)    Gen: NAD, alert, cooperative with exam, NCAT EYES: EOMI, no conjunctival injection, or no icterus ENT:  TMs pearly gray b/l, OP without erythema LYMPH: no cervical LAD CV: NRRR, normal S1/S2, no murmur, distal pulses 2+  b/l Resp: CTABL, no wheezes, normal WOB Abd: +BS, soft, mildly tender with palpation throughout, ND. no guarding or organomegaly Ext: No edema, warm Neuro: Alert and oriented MSK: normal muscle bulk  Assessment & Plan:  Andre Hunt was seen today for fatigue, nausea, emesis, abdominal pain and diarrhea.  Diagnoses and all orders for this visit:  Non-intractable vomiting with nausea, unspecified vomiting type Symptoms for the past 5 days.  Suspect viral cause.  Abdominal pain off and on.  We will get blood work.  Prescribed Phenergan for nausea.  Return precautions discussed.  If not improving within the week, return to clinic, may need additional imaging. -     Alpha-Gal Panel -     CMP14+EGFR -     CBC with Differential/Platelet -     Sedimentation rate -     promethazine (PHENERGAN) 12.5 MG tablet; Take 1 tablet (12.5 mg total) by mouth every 8 (eight) hours as needed for nausea or vomiting.  Abdominal pain, unspecified abdominal location -     Alpha-Gal Panel -     CMP14+EGFR -     CBC with Differential/Platelet -     Sedimentation rate -     Urinalysis, Complete  Other orders -  Microscopic Examination   Follow up plan: Return if symptoms worsen or fail to improve. Assunta Found, MD [place]

## 2018-04-07 LAB — ALPHA-GAL PANEL
ALPHA GAL IGE: 4.52 kU/L — AB (ref <0.10)
Beef (Bos spp) IgE: 1.04 kU/L — ABNORMAL HIGH (ref <0.35)
Class Interpretation: 2
Lamb/Mutton (Ovis spp) IgE: 0.26 kU/L (ref <0.35)
PORK (SUS SPP) IGE: 0.29 kU/L (ref <0.35)

## 2018-04-07 LAB — CBC WITH DIFFERENTIAL/PLATELET
BASOS ABS: 0 10*3/uL (ref 0.0–0.2)
Basos: 0 %
EOS (ABSOLUTE): 0.1 10*3/uL (ref 0.0–0.4)
Eos: 1 %
Hematocrit: 41.9 % (ref 37.5–51.0)
Hemoglobin: 14 g/dL (ref 13.0–17.7)
Immature Grans (Abs): 0 10*3/uL (ref 0.0–0.1)
Immature Granulocytes: 0 %
LYMPHS ABS: 1.7 10*3/uL (ref 0.7–3.1)
Lymphs: 24 %
MCH: 30.2 pg (ref 26.6–33.0)
MCHC: 33.4 g/dL (ref 31.5–35.7)
MCV: 90 fL (ref 79–97)
Monocytes Absolute: 1 10*3/uL — ABNORMAL HIGH (ref 0.1–0.9)
Monocytes: 15 %
Neutrophils Absolute: 4.2 10*3/uL (ref 1.4–7.0)
Neutrophils: 60 %
PLATELETS: 262 10*3/uL (ref 150–450)
RBC: 4.64 x10E6/uL (ref 4.14–5.80)
RDW: 12.5 % (ref 12.3–15.4)
WBC: 7.1 10*3/uL (ref 3.4–10.8)

## 2018-04-07 LAB — CMP14+EGFR
ALBUMIN: 4.1 g/dL (ref 3.5–5.5)
ALK PHOS: 61 IU/L (ref 39–117)
ALT: 19 IU/L (ref 0–44)
AST: 14 IU/L (ref 0–40)
Albumin/Globulin Ratio: 1.6 (ref 1.2–2.2)
BILIRUBIN TOTAL: 0.4 mg/dL (ref 0.0–1.2)
BUN / CREAT RATIO: 17 (ref 9–20)
BUN: 17 mg/dL (ref 6–24)
CHLORIDE: 103 mmol/L (ref 96–106)
CO2: 24 mmol/L (ref 20–29)
Calcium: 9 mg/dL (ref 8.7–10.2)
Creatinine, Ser: 1.03 mg/dL (ref 0.76–1.27)
GFR calc Af Amer: 93 mL/min/{1.73_m2} (ref >59)
GFR calc non Af Amer: 81 mL/min/{1.73_m2} (ref >59)
Globulin, Total: 2.5 g/dL (ref 1.5–4.5)
Glucose: 91 mg/dL (ref 65–99)
Potassium: 4.4 mmol/L (ref 3.5–5.2)
Sodium: 144 mmol/L (ref 134–144)
Total Protein: 6.6 g/dL (ref 6.0–8.5)

## 2018-04-07 LAB — SEDIMENTATION RATE: SED RATE: 7 mm/h (ref 0–30)

## 2018-04-08 ENCOUNTER — Other Ambulatory Visit: Payer: Self-pay | Admitting: Pediatrics

## 2018-04-08 DIAGNOSIS — Z91018 Allergy to other foods: Secondary | ICD-10-CM

## 2018-04-08 MED ORDER — EPINEPHRINE 0.3 MG/0.3ML IJ SOAJ: 0.3000 mg | Freq: Once | INTRAMUSCULAR | 0 refills | Status: AC

## 2018-04-10 ENCOUNTER — Other Ambulatory Visit: Payer: Self-pay | Admitting: Nurse Practitioner

## 2018-04-12 ENCOUNTER — Telehealth: Payer: Self-pay | Admitting: Nurse Practitioner

## 2018-04-12 NOTE — Telephone Encounter (Signed)
Pt is wanting to speak to [facility] nurse about the allergy specialist that we referred him to, he wants to speak to her about this before she schedules. States that last time he came in was due to food poisoning but was referred there bc of allergy to red meat states he eats read meat all the time and doesn't understand this referral

## 2018-04-13 NOTE — Telephone Encounter (Signed)
lmtcb

## 2018-04-13 NOTE — Telephone Encounter (Signed)
Does not need to see allergist. They really cannot do anything about it. He testted positive for beef allergy only. May not be severe enough to have reaction but may occur - just need to be aware and have epipen available just in case has reaction.

## 2018-04-20 ENCOUNTER — Other Ambulatory Visit: Payer: Self-pay | Admitting: Nurse Practitioner

## 2018-04-20 DIAGNOSIS — E785 Hyperlipidemia, unspecified: Secondary | ICD-10-CM

## 2018-04-22 ENCOUNTER — Ambulatory Visit (INDEPENDENT_AMBULATORY_CARE_PROVIDER_SITE_OTHER): Payer: Medicare Other | Admitting: Orthopaedic Surgery

## 2018-04-22 ENCOUNTER — Encounter: Payer: Self-pay | Admitting: Orthopaedic Surgery

## 2018-04-22 VITALS — BP 128/76 | HR 66 | Ht 68.0 in | Wt 221.0 lb

## 2018-04-22 DIAGNOSIS — M25511 Pain in right shoulder: Secondary | ICD-10-CM | POA: Diagnosis not present

## 2018-04-22 DIAGNOSIS — G8929 Other chronic pain: Secondary | ICD-10-CM | POA: Diagnosis not present

## 2018-04-22 NOTE — Progress Notes (Signed)
Patient Andre Hunt, male DOB:1960-10-09, 57 y.o. BPZ:[id number]  Chief Complaint  Patient presents with  . Shoulder Pain    Right shoulder    HPI  Andre Hunt is a 57 y.o. male who has chronic pain of the right shoulder.  He has been remodeling his home and has more pain.  He has no other trauma.  He has no numbness. He is taking his medicine.   Body mass index is 33.6 kg/m.  ROS  Review of Systems  HENT: Negative for congestion.   Respiratory: Negative for cough and shortness of breath.   Cardiovascular: Negative for chest pain and leg swelling.  Endocrine: Positive for cold intolerance.  Musculoskeletal: Positive for arthralgias and joint swelling.  Allergic/Immunologic: Positive for environmental allergies.  Neurological: Negative for numbness.  All other systems reviewed and are negative.   All other systems reviewed and are negative.  The following is a summary of the past history medically, past history surgically, known current medicines, social history and family history.  This information is gathered electronically by the computer from prior information and documentation.  I review this each visit and have found including this information at this point in the chart is beneficial and informative.    Past Medical History:  Diagnosis Date  . Allergy   . Gout   . Hyperlipidemia   . Personal history of colonic adenoma 09/28/2012    Past Surgical History:  Procedure Laterality Date  . APPENDECTOMY  1988  . CARPAL TUNNEL RELEASE     bilateral 2000 and 2001  . COLONOSCOPY    . ELBOW SURGERY  2006   muscle repair  . EXPLORATORY LAPAROTOMY  1996   after falling on scissors; repair of "hole in intestines"  . [place]  . UMBILICAL HERNIA REPAIR  1968    Family History  Problem Relation Age of Onset  . Kidney cancer Mother   . Diabetes Mother   . COPD Mother   . Anuerysm Father   . Diabetes Father   . Heart disease Father   .  Diabetes Sister   . Colon cancer Neg Hx     Social History Social History   Tobacco Use  . Smoking status: Never Smoker  . Smokeless tobacco: Current User    Types: Snuff  Substance Use Topics  . Alcohol use: No  . Drug use: No    Allergies  Allergen Reactions  . Iohexol Anaphylaxis    Allergy to IVP dye    Current Outpatient Medications  Medication Sig Dispense Refill  . allopurinol (ZYLOPRIM) 300 MG tablet Take 1 tablet (300 mg total) by mouth daily. 30 tablet 5  . atorvastatin (LIPITOR) 40 MG tablet Take 1 tablet (40 mg total) by mouth daily. 30 tablet 4  . cetirizine (ZYRTEC) 10 MG tablet TAKE 1 TABLET DAILY 30 tablet 5  . colchicine 0.6 MG tablet One tid po for five days. 15 tablet 5  . diazepam (VALIUM) 5 MG tablet One tablet as needed twice a day for spasm, arm pain. 60 tablet 2  . HYDROcodone-acetaminophen (NORCO) 7.5-325 MG tablet TAKE 1 TABLET EVERY 4 HOURS AS NEEDED FOR MODERATE PAIN 105 tablet 0  . lisinopril (PRINIVIL,ZESTRIL) 10 MG tablet Take 1 tablet (10 mg total) by mouth daily. 90 tablet 1  . promethazine (PHENERGAN) 12.5 MG tablet Take 1 tablet (12.5 mg total) by mouth every 8 (eight) hours as needed for nausea or vomiting. 20 tablet 0  No current facility-administered medications for this visit.      Physical Exam  Blood pressure 128/76, pulse 66, height 5\' 8"  (1.727 m), weight 221 lb (100.2 kg).  Constitutional: overall normal hygiene, normal nutrition, well developed, normal grooming, normal body habitus. Assistive device:none  Musculoskeletal: gait and station Limp none, muscle tone and strength are normal, no tremors or atrophy is present.  .  Neurological: coordination overall normal.  Deep tendon reflex/nerve stretch intact.  Sensation normal.  Cranial nerves II-XII intact.   Skin:   Normal overall no scars, lesions, ulcers or rashes. No psoriasis.  Psychiatric: Alert and oriented x 3.  Recent memory intact, remote memory unclear.  Normal  mood and affect. Well groomed.  Good eye contact.  Cardiovascular: overall no swelling, no varicosities, no edema bilaterally, normal temperatures of the legs and arms, no clubbing, cyanosis and good capillary refill.  Lymphatic: palpation is normal.  Right shoulder has near full motion with pain in the extremes.  NV intact.  All other systems reviewed and are negative   The patient has been educated about the nature of the problem(s) and counseled on treatment options.  The patient appeared to understand what I have discussed and is in agreement with it.  Encounter Diagnosis  Name Primary?  . Chronic right shoulder pain Yes    PLAN Call if any problems.  Precautions discussed.  Continue current medications.   Return to clinic 3 months   Electronically Signed Sanjuana Kava, MD 11/7/20198:20 AM

## 2018-05-03 ENCOUNTER — Ambulatory Visit: Payer: Medicare Other | Admitting: *Deleted

## 2018-05-19 ENCOUNTER — Telehealth: Payer: Self-pay | Admitting: Orthopaedic Surgery

## 2018-05-19 MED ORDER — HYDROCODONE-ACETAMINOPHEN 7.5-325 MG PO TABS: ORAL_TABLET | ORAL | 0 refills | Status: DC

## 2018-05-19 NOTE — Telephone Encounter (Signed)
Hydrocodone-Acetaminophen  7.5/325 mg  Qty 105 Tablets  PATIENT USES [facility]

## 2018-06-21 ENCOUNTER — Encounter: Payer: Self-pay | Admitting: Nurse Practitioner

## 2018-06-21 ENCOUNTER — Ambulatory Visit (INDEPENDENT_AMBULATORY_CARE_PROVIDER_SITE_OTHER): Payer: Medicare Other | Admitting: Nurse Practitioner

## 2018-06-21 VITALS — BP 107/73 | HR 67 | Temp 97.4°F | Ht 68.0 in | Wt 219.0 lb

## 2018-06-21 DIAGNOSIS — J40 Bronchitis, not specified as acute or chronic: Secondary | ICD-10-CM | POA: Diagnosis not present

## 2018-06-21 MED ORDER — BENZONATATE 100 MG PO CAPS: 100.0000 mg | ORAL_CAPSULE | Freq: Three times a day (TID) | ORAL | 0 refills | Status: DC | PRN

## 2018-06-21 MED ORDER — PREDNISONE 20 MG PO TABS: ORAL_TABLET | ORAL | 0 refills | Status: DC

## 2018-06-21 NOTE — Patient Instructions (Signed)
Acute Bronchitis, Adult Acute bronchitis is when air tubes (bronchi) in the lungs suddenly get swollen. The condition can make it hard to breathe. It can also cause these symptoms:  A cough.  Coughing up clear, yellow, or green mucus.  Wheezing.  Chest congestion.  Shortness of breath.  A fever.  Body aches.  Chills.  A sore throat. Follow these instructions at home:  Medicines  Take over-the-counter and prescription medicines only as told by your doctor.  If you were prescribed an antibiotic medicine, take it as told by your doctor. Do not stop taking the antibiotic even if you start to feel better. General instructions  Rest.  Drink enough fluids to keep your pee (urine) pale yellow.  Avoid smoking and secondhand smoke. If you smoke and you need help quitting, ask your doctor. Quitting will help your lungs heal faster.  Use an inhaler, cool mist vaporizer, or humidifier as told by your doctor.  Keep all follow-up visits as told by your doctor. This is important. How is this prevented? To lower your risk of getting this condition again:  Wash your hands often with soap and water. If you cannot use soap and water, use hand sanitizer.  Avoid contact with people who have cold symptoms.  Try not to touch your hands to your mouth, nose, or eyes.  Make sure to get the flu shot every year. Contact a doctor if:  Your symptoms do not get better in 2 weeks. Get help right away if:  You cough up blood.  You have chest pain.  You have very bad shortness of breath.  You become dehydrated.  You faint (pass out) or keep feeling like you are going to pass out.  You keep throwing up (vomiting).  You have a very bad headache.  Your fever or chills gets worse. This information is not intended to replace advice given to you by your health care provider. Make sure you discuss any questions you have with your health care provider. Document Released: 11/19/2007 Document  Revised: 01/14/2017 Document Reviewed: 11/21/2015 Elsevier Interactive Patient Education  2019 [employer] Inc.  

## 2018-06-21 NOTE — Progress Notes (Signed)
   Subjective:    Patient ID: Andre Hunt, male    DOB: 03/18/1961, 58 y.o.   MRN: [id number]   Chief Complaint: Cough (1 week ago)   HPI Patient comes in today c/o cough that he has had for over a week. He has had no fever or chills. He has tried nyquil and dayquil.   Review of Systems  Constitutional: Negative for chills and fever.  HENT: Positive for congestion. Negative for ear pain, sinus pain, sore throat and trouble swallowing.   Respiratory: Positive for cough.   Cardiovascular: Negative.   Gastrointestinal: Negative.   Neurological: Negative for headaches.  Psychiatric/Behavioral: Negative.   All other systems reviewed and are negative.      Objective:   Physical Exam Constitutional:      General: He is not in acute distress.    Appearance: Normal appearance. He is normal weight.  HENT:     Right Ear: Hearing, tympanic membrane, ear canal and external ear normal.     Left Ear: Hearing, tympanic membrane, ear canal and external ear normal.     Nose: Nose normal.     Mouth/Throat:     Mouth: Mucous membranes are moist.     Pharynx: Oropharynx is clear. No posterior oropharyngeal erythema or uvula swelling.  Neck:     Musculoskeletal: Normal range of motion and neck supple.  Cardiovascular:     Rate and Rhythm: Normal rate.     Pulses: Normal pulses.  Pulmonary:     Effort: Pulmonary effort is normal.     Breath sounds: Normal breath sounds.     Comments: Dry hacky cough Skin:    General: Skin is warm.  Neurological:     General: No focal deficit present.     Mental Status: He is alert and oriented to person, place, and time.  Psychiatric:        Mood and Affect: Mood normal.        Behavior: Behavior normal.    BP 107/73   Pulse 67   Temp (!) 97.4 F (36.3 C) (Oral)   Ht 5\' 8"  (1.727 m)   Wt 219 lb (99.3 kg)   BMI 33.30 kg/m       Assessment & Plan:  Andre Hunt in today with chief complaint of Cough (1 week ago)   1.  Bronchitis Force fluids Rest hunmidifier RTO prn - predniSONE (DELTASONE) 20 MG tablet; 2 po at sametime daily for 5 days  Dispense: 10 tablet; Refill: 0 - benzonatate (TESSALON PERLES) 100 MG capsule; Take 1 capsule (100 mg total) by mouth 3 (three) times daily as needed for cough.  Dispense: 20 capsule; Refill: 0  Mary-Margaret Hassell Done, FNP

## 2018-06-24 ENCOUNTER — Telehealth: Payer: Self-pay | Admitting: Orthopaedic Surgery

## 2018-06-24 MED ORDER — HYDROCODONE-ACETAMINOPHEN 7.5-325 MG PO TABS: ORAL_TABLET | ORAL | 0 refills | Status: DC

## 2018-06-24 NOTE — Telephone Encounter (Signed)
Patient requests refill on Hydrocodone/Acetaminophen 7.5-325  Mgs.  Qty 105  Sig: TAKE 1 TABLET EVERY 4 HOURS AS NEEDED FOR MODERATE PAIN  Patient states he uses [facility]

## 2018-07-22 ENCOUNTER — Encounter: Payer: Self-pay | Admitting: Orthopaedic Surgery

## 2018-07-22 ENCOUNTER — Ambulatory Visit (INDEPENDENT_AMBULATORY_CARE_PROVIDER_SITE_OTHER): Payer: Medicare Other | Admitting: Orthopaedic Surgery

## 2018-07-22 VITALS — BP 132/77 | HR 61 | Ht 68.0 in | Wt 228.0 lb

## 2018-07-22 DIAGNOSIS — M25511 Pain in right shoulder: Secondary | ICD-10-CM | POA: Diagnosis not present

## 2018-07-22 DIAGNOSIS — G8929 Other chronic pain: Secondary | ICD-10-CM | POA: Diagnosis not present

## 2018-07-22 NOTE — Progress Notes (Signed)
Patient Andre Hunt, male DOB:1961/05/13, 58 y.o. TKW:[id number]  Chief Complaint  Patient presents with  . Shoulder Pain    Chronic right shoulder pain.    HPI  Andre Hunt is a 58 y.o. male who has chronic right shoulder pain and right lateral epicondylitis.  He has no new trauma.  He has popping and sometimes swelling.  The cold weather makes him worse.  He is trying to do his exercises and is taking his medicine.  He has no numbness.   Body mass index is 34.67 kg/m.  ROS  Review of Systems  HENT: Negative for congestion.   Respiratory: Negative for cough and shortness of breath.   Cardiovascular: Negative for chest pain and leg swelling.  Endocrine: Positive for cold intolerance.  Musculoskeletal: Positive for arthralgias and joint swelling.  Allergic/Immunologic: Positive for environmental allergies.  Neurological: Negative for numbness.  All other systems reviewed and are negative.   All other systems reviewed and are negative.  The following is a summary of the past history medically, past history surgically, known current medicines, social history and family history.  This information is gathered electronically by the computer from prior information and documentation.  I review this each visit and have found including this information at this point in the chart is beneficial and informative.    Past Medical History:  Diagnosis Date  . Allergy   . Gout   . Hyperlipidemia   . Personal history of colonic adenoma 09/28/2012    Past Surgical History:  Procedure Laterality Date  . APPENDECTOMY  1988  . CARPAL TUNNEL RELEASE     bilateral 2000 and 2001  . COLONOSCOPY    . ELBOW SURGERY  2006   muscle repair  . EXPLORATORY LAPAROTOMY  1996   after falling on scissors; repair of "hole in intestines"  . [place]  . UMBILICAL HERNIA REPAIR  1968    Family History  Problem Relation Age of Onset  . Kidney cancer Mother   . Diabetes  Mother   . COPD Mother   . Anuerysm Father   . Diabetes Father   . Heart disease Father   . Diabetes Sister   . Colon cancer Neg Hx     Social History Social History   Tobacco Use  . Smoking status: Never Smoker  . Smokeless tobacco: Current User    Types: Snuff  Substance Use Topics  . Alcohol use: No  . Drug use: No    Allergies  Allergen Reactions  . Iohexol Anaphylaxis    Allergy to IVP dye    Current Outpatient Medications  Medication Sig Dispense Refill  . allopurinol (ZYLOPRIM) 300 MG tablet Take 1 tablet (300 mg total) by mouth daily. 30 tablet 5  . atorvastatin (LIPITOR) 40 MG tablet Take 1 tablet (40 mg total) by mouth daily. 30 tablet 4  . benzonatate (TESSALON PERLES) 100 MG capsule Take 1 capsule (100 mg total) by mouth 3 (three) times daily as needed for cough. 20 capsule 0  . cetirizine (ZYRTEC) 10 MG tablet TAKE 1 TABLET DAILY 30 tablet 5  . colchicine 0.6 MG tablet One tid po for five days. 15 tablet 5  . diazepam (VALIUM) 5 MG tablet One tablet as needed twice a day for spasm, arm pain. 60 tablet 2  . HYDROcodone-acetaminophen (NORCO) 7.5-325 MG tablet TAKE 1 TABLET EVERY 4 HOURS AS NEEDED FOR MODERATE PAIN 105 tablet 0  . lisinopril (PRINIVIL,ZESTRIL) 10 MG tablet  Take 1 tablet (10 mg total) by mouth daily. 90 tablet 1  . predniSONE (DELTASONE) 20 MG tablet 2 po at sametime daily for 5 days 10 tablet 0  . promethazine (PHENERGAN) 12.5 MG tablet Take 1 tablet (12.5 mg total) by mouth every 8 (eight) hours as needed for nausea or vomiting. 20 tablet 0   No current facility-administered medications for this visit.      Physical Exam  Blood pressure 132/77, pulse 61, height 5\' 8"  (1.727 m), weight 228 lb (103.4 kg).  Constitutional: overall normal hygiene, normal nutrition, well developed, normal grooming, normal body habitus. Assistive device:none  Musculoskeletal: gait and station Limp none, muscle tone and strength are normal, no tremors or atrophy  is present.  .  Neurological: coordination overall normal.  Deep tendon reflex/nerve stretch intact.  Sensation normal.  Cranial nerves II-XII intact.   Skin:   Normal overall no scars, lesions, ulcers or rashes. No psoriasis.  Psychiatric: Alert and oriented x 3.  Recent memory intact, remote memory unclear.  Normal mood and affect. Well groomed.  Good eye contact.  Cardiovascular: overall no swelling, no varicosities, no edema bilaterally, normal temperatures of the legs and arms, no clubbing, cyanosis and good capillary refill.  Lymphatic: palpation is normal.  Examination of right Upper Extremity is done.  Inspection:   Overall:  Elbow non-tender without crepitus or defects, forearm non-tender without crepitus or defects, wrist non-tender without crepitus or defects, hand non-tender.    Shoulder: with glenohumeral joint tenderness, without effusion.   Upper arm: without swelling and tenderness   Range of motion:   Overall:  Full range of motion of the elbow, full range of motion of wrist and full range of motion in fingers.   Shoulder:  right  165 degrees forward flexion; 150 degrees abduction; 35 degrees internal rotation, 35 degrees external rotation, 15 degrees extension, 40 degrees adduction.   Stability:   Overall:  Shoulder, elbow and wrist stable   Strength and Tone:   Overall full shoulder muscles strength, full upper arm strength and normal upper arm bulk and tone.  All other systems reviewed and are negative   The patient has been educated about the nature of the problem(s) and counseled on treatment options.  The patient appeared to understand what I have discussed and is in agreement with it.  No diagnosis found.  PLAN Call if any problems.  Precautions discussed.  Continue current medications.   Return to clinic 3 months   Electronically Signed Sanjuana Kava, MD 2/6/20208:44 AM

## 2018-07-29 ENCOUNTER — Telehealth: Payer: Self-pay | Admitting: Orthopaedic Surgery

## 2018-07-29 MED ORDER — HYDROCODONE-ACETAMINOPHEN 7.5-325 MG PO TABS: ORAL_TABLET | ORAL | 0 refills | Status: DC

## 2018-07-29 NOTE — Telephone Encounter (Signed)
Patient called and requested refill on Hydrocodone/Acetaminophen 7.5-325  Mgs.  Qty  105    He did ask if the medication mgs could be "bumped" up because he was hurting a lot due to all the rain that we have been having.  I told him that did not think Dr. Luna Glasgow wrote prescriptions for any higher mgs.  He said he understood but just wanted to run that past Dr. Luna Glasgow  So, if you will give him something stronger that's great but if not, please refill the Hydrocodone/Acetaminophen 7.5-325 mgs.  Qty  105  Sig: TAKE 1 TABLET EVERY 4 HOURS AS NEEDED FOR MODERATE PAIN  Patient uses [facility]

## 2018-08-20 ENCOUNTER — Other Ambulatory Visit: Payer: Self-pay | Admitting: Orthopaedic Surgery

## 2018-08-30 ENCOUNTER — Telehealth: Payer: Self-pay | Admitting: Orthopaedic Surgery

## 2018-08-30 NOTE — Telephone Encounter (Signed)
Patient called for refilll, pain medication: HYDROcodone-acetaminophen (NORCO) 7.5-325 MG tablet 110 tablet   [facility]

## 2018-08-31 MED ORDER — HYDROCODONE-ACETAMINOPHEN 7.5-325 MG PO TABS: ORAL_TABLET | ORAL | 0 refills | Status: DC

## 2018-09-25 ENCOUNTER — Other Ambulatory Visit: Payer: Self-pay | Admitting: Nurse Practitioner

## 2018-09-25 DIAGNOSIS — E785 Hyperlipidemia, unspecified: Secondary | ICD-10-CM

## 2018-10-04 ENCOUNTER — Telehealth: Payer: Self-pay | Admitting: Orthopaedic Surgery

## 2018-10-04 MED ORDER — HYDROCODONE-ACETAMINOPHEN 7.5-325 MG PO TABS: ORAL_TABLET | ORAL | 0 refills | Status: DC

## 2018-10-04 NOTE — Telephone Encounter (Signed)
Hydrocodone-Acetaminophen  7.5/325 mg  Qty 110 Tablets  PATIENT USES [facility]

## 2018-10-16 ENCOUNTER — Other Ambulatory Visit: Payer: Self-pay | Admitting: Nurse Practitioner

## 2018-10-16 DIAGNOSIS — I10 Essential (primary) hypertension: Secondary | ICD-10-CM

## 2018-10-20 ENCOUNTER — Ambulatory Visit: Payer: Self-pay | Admitting: Orthopaedic Surgery

## 2018-10-21 ENCOUNTER — Ambulatory Visit (INDEPENDENT_AMBULATORY_CARE_PROVIDER_SITE_OTHER): Payer: Medicare Other | Admitting: Orthopaedic Surgery

## 2018-10-21 ENCOUNTER — Other Ambulatory Visit: Payer: Self-pay

## 2018-10-21 ENCOUNTER — Encounter: Payer: Self-pay | Admitting: Orthopaedic Surgery

## 2018-10-21 VITALS — BP 124/78 | HR 61 | Temp 96.2°F | Ht 68.0 in | Wt 222.0 lb

## 2018-10-21 DIAGNOSIS — M79641 Pain in right hand: Secondary | ICD-10-CM | POA: Diagnosis not present

## 2018-10-21 MED ORDER — NAPROXEN 500 MG PO TABS: 500.0000 mg | ORAL_TABLET | Freq: Two times a day (BID) | ORAL | 5 refills | Status: DC

## 2018-10-21 NOTE — Progress Notes (Signed)
Patient Andre Hunt, male DOB:[birth year], 58 y.o. KDT:[id number]  Chief Complaint  Patient presents with  . Shoulder Pain    Chronic right shoulder pain.    HPI  Andre Hunt is a 58 y.o. male who has developed right hand pain and some swelling, more in the morning and when his hand gets cold.  He has no redness but says it really hurts for a while then gets better as the day goes on.  He has no trauma.  He has not taken any medicine for it.  He has no numbness.  His right elbow has chronic tendinitis and that has not changed.   Body mass index is 33.75 kg/m.  ROS  Review of Systems  HENT: Negative for congestion.   Respiratory: Negative for cough and shortness of breath.   Cardiovascular: Negative for chest pain and leg swelling.  Endocrine: Positive for cold intolerance.  Musculoskeletal: Positive for arthralgias and joint swelling.  Allergic/Immunologic: Positive for environmental allergies.  Neurological: Negative for numbness.  All other systems reviewed and are negative.   All other systems reviewed and are negative.  The following is a summary of the past history medically, past history surgically, known current medicines, social history and family history.  This information is gathered electronically by the computer from prior information and documentation.  I review this each visit and have found including this information at this point in the chart is beneficial and informative.    Past Medical History:  Diagnosis Date  . Allergy   . Gout   . Hyperlipidemia   . Personal history of colonic adenoma 09/28/2012    Past Surgical History:  Procedure Laterality Date  . APPENDECTOMY  1988  . CARPAL TUNNEL RELEASE     bilateral 2000 and 2001  . COLONOSCOPY    . ELBOW SURGERY  2006   muscle repair  . EXPLORATORY LAPAROTOMY  1996   after falling on scissors; repair of "hole in intestines"  . [place]  . UMBILICAL HERNIA REPAIR  1968     Family History  Problem Relation Age of Onset  . Kidney cancer Mother   . Diabetes Mother   . COPD Mother   . Anuerysm Father   . Diabetes Father   . Heart disease Father   . Diabetes Sister   . Colon cancer Neg Hx     Social History Social History   Tobacco Use  . Smoking status: Never Smoker  . Smokeless tobacco: Current User    Types: Snuff  Substance Use Topics  . Alcohol use: No  . Drug use: No    Allergies  Allergen Reactions  . Iohexol Anaphylaxis    Allergy to IVP dye    Current Outpatient Medications  Medication Sig Dispense Refill  . allopurinol (ZYLOPRIM) 300 MG tablet Take 1 tablet (300 mg total) by mouth daily. 30 tablet 5  . atorvastatin (LIPITOR) 40 MG tablet Take 1 tablet (40 mg total) by mouth daily. 30 tablet 0  . benzonatate (TESSALON PERLES) 100 MG capsule Take 1 capsule (100 mg total) by mouth 3 (three) times daily as needed for cough. 20 capsule 0  . cetirizine (ZYRTEC) 10 MG tablet TAKE 1 TABLET DAILY 30 tablet 5  . colchicine 0.6 MG tablet One tid po for five days. 15 tablet 5  . diazepam (VALIUM) 5 MG tablet TAKE (1) TABLET TWICE A DAY AS NEEDED FOR ARM SPASM 60 tablet 2  . HYDROcodone-acetaminophen (NORCO) 7.5-325  MG tablet TAKE 1 TABLET EVERY 4 HOURS AS NEEDED FOR MODERATE PAIN 110 tablet 0  . lisinopril (ZESTRIL) 10 MG tablet Take 1 tablet (10 mg total) by mouth daily. 30 tablet 0  . naproxen (NAPROSYN) 500 MG tablet Take 1 tablet (500 mg total) by mouth 2 (two) times daily with a meal. 60 tablet 5  . predniSONE (DELTASONE) 20 MG tablet 2 po at sametime daily for 5 days 10 tablet 0  . promethazine (PHENERGAN) 12.5 MG tablet Take 1 tablet (12.5 mg total) by mouth every 8 (eight) hours as needed for nausea or vomiting. 20 tablet 0   No current facility-administered medications for this visit.      Physical Exam  Blood pressure 124/78, pulse 61, temperature (!) 96.2 F (35.7 C), height 5\' 8"  (1.727 m), weight 222 lb (100.7  kg).  Constitutional: overall normal hygiene, normal nutrition, well developed, normal grooming, normal body habitus. Assistive device:none  Musculoskeletal: gait and station Limp none, muscle tone and strength are normal, no tremors or atrophy is present.  .  Neurological: coordination overall normal.  Deep tendon reflex/nerve stretch intact.  Sensation normal.  Cranial nerves II-XII intact.   Skin:   Normal overall no scars, lesions, ulcers or rashes. No psoriasis.  Psychiatric: Alert and oriented x 3.  Recent memory intact, remote memory unclear.  Normal mood and affect. Well groomed.  Good eye contact.  Cardiovascular: overall no swelling, no varicosities, no edema bilaterally, normal temperatures of the legs and arms, no clubbing, cyanosis and good capillary refill.  Lymphatic: palpation is normal.  He has some tenderness of the metacarpal heads 2, 3 and 4 with slight swelling, no redness.  He cannot make a full fist.  NV intact.  ROM of wrist is full, elbow is full but chronically tender over epicondyle laterally.    All other systems reviewed and are negative   The patient has been educated about the nature of the problem(s) and counseled on treatment options.  The patient appeared to understand what I have discussed and is in agreement with it.  Encounter Diagnosis  Name Primary?  . Hand pain, right Yes    PLAN Call if any problems.  Precautions discussed.  Continue current medications. I will begin naprosyn.    Return to clinic 1 month   Electronically Signed Andre Kava, MD 5/7/20209:16 AM

## 2018-10-28 ENCOUNTER — Telehealth: Payer: Self-pay | Admitting: Nurse Practitioner

## 2018-11-09 ENCOUNTER — Telehealth: Payer: Self-pay | Admitting: Orthopaedic Surgery

## 2018-11-09 ENCOUNTER — Other Ambulatory Visit: Payer: Self-pay | Admitting: Nurse Practitioner

## 2018-11-09 DIAGNOSIS — E785 Hyperlipidemia, unspecified: Secondary | ICD-10-CM

## 2018-11-09 MED ORDER — HYDROCODONE-ACETAMINOPHEN 7.5-325 MG PO TABS: ORAL_TABLET | ORAL | 0 refills | Status: DC

## 2018-11-09 NOTE — Telephone Encounter (Signed)
Patient requests refill on Hydrocodone/Acetaminophen 7.5-325  Mgs.   Qty  110 °  °Sig: TAKE 1 TABLET EVERY 4 HOURS AS NEEDED FOR MODERATE PAIN °  °Patient states he uses [facility] °

## 2018-11-18 ENCOUNTER — Other Ambulatory Visit: Payer: Self-pay

## 2018-11-18 ENCOUNTER — Ambulatory Visit (INDEPENDENT_AMBULATORY_CARE_PROVIDER_SITE_OTHER): Payer: Medicare Other | Admitting: Orthopaedic Surgery

## 2018-11-18 ENCOUNTER — Encounter: Payer: Self-pay | Admitting: Orthopaedic Surgery

## 2018-11-18 VITALS — BP 119/74 | HR 55 | Temp 98.4°F | Ht 68.0 in | Wt 220.0 lb

## 2018-11-18 DIAGNOSIS — G8929 Other chronic pain: Secondary | ICD-10-CM | POA: Diagnosis not present

## 2018-11-18 DIAGNOSIS — M25511 Pain in right shoulder: Secondary | ICD-10-CM

## 2018-11-18 DIAGNOSIS — M79641 Pain in right hand: Secondary | ICD-10-CM

## 2018-11-18 NOTE — Progress Notes (Signed)
Patient Andre Hunt, male DOB:01-11-1961, 58 y.o. DGL:[id number]  Chief Complaint  Patient presents with  . Shoulder Pain    right     HPI  Andre Hunt is a 58 y.o. male who has right shoulder and hand pain.  His hand pain is much improved but he had a reaction to the Naprosyn and had some rectal bleeding.  He stopped the medicine.  He has no new trauma, no redness.  His shoulder has no pain today.   Body mass index is 33.45 kg/m.  ROS  Review of Systems  HENT: Negative for congestion.   Respiratory: Negative for cough and shortness of breath.   Cardiovascular: Negative for chest pain and leg swelling.  Endocrine: Positive for cold intolerance.  Musculoskeletal: Positive for arthralgias and joint swelling.  Allergic/Immunologic: Positive for environmental allergies.  Neurological: Negative for numbness.  All other systems reviewed and are negative.   All other systems reviewed and are negative.  The following is a summary of the past history medically, past history surgically, known current medicines, social history and family history.  This information is gathered electronically by the computer from prior information and documentation.  I review this each visit and have found including this information at this point in the chart is beneficial and informative.    Past Medical History:  Diagnosis Date  . Allergy   . Gout   . Hyperlipidemia   . Personal history of colonic adenoma 09/28/2012    Past Surgical History:  Procedure Laterality Date  . APPENDECTOMY  1988  . CARPAL TUNNEL RELEASE     bilateral 2000 and 2001  . COLONOSCOPY    . ELBOW SURGERY  2006   muscle repair  . EXPLORATORY LAPAROTOMY  1996   after falling on scissors; repair of "hole in intestines"  . [place]  . UMBILICAL HERNIA REPAIR  1968    Family History  Problem Relation Age of Onset  . Kidney cancer Mother   . Diabetes Mother   . COPD Mother   . Anuerysm  Father   . Diabetes Father   . Heart disease Father   . Diabetes Sister   . Colon cancer Neg Hx     Social History Social History   Tobacco Use  . Smoking status: Never Smoker  . Smokeless tobacco: Current User    Types: Snuff  Substance Use Topics  . Alcohol use: No  . Drug use: No    Allergies  Allergen Reactions  . Iohexol Anaphylaxis    Allergy to IVP dye    Current Outpatient Medications  Medication Sig Dispense Refill  . allopurinol (ZYLOPRIM) 300 MG tablet Take 1 tablet (300 mg total) by mouth daily. 30 tablet 5  . atorvastatin (LIPITOR) 40 MG tablet Take 1 tablet (40 mg total) by mouth daily. (Needs to be seen before next refill) 30 tablet 0  . benzonatate (TESSALON PERLES) 100 MG capsule Take 1 capsule (100 mg total) by mouth 3 (three) times daily as needed for cough. 20 capsule 0  . cetirizine (ZYRTEC) 10 MG tablet TAKE 1 TABLET DAILY 30 tablet 5  . colchicine 0.6 MG tablet One tid po for five days. 15 tablet 5  . diazepam (VALIUM) 5 MG tablet TAKE (1) TABLET TWICE A DAY AS NEEDED FOR ARM SPASM 60 tablet 2  . HYDROcodone-acetaminophen (NORCO) 7.5-325 MG tablet TAKE 1 TABLET EVERY 4 HOURS AS NEEDED FOR MODERATE PAIN 110 tablet 0  . lisinopril (  ZESTRIL) 10 MG tablet Take 1 tablet (10 mg total) by mouth daily. 30 tablet 0  . naproxen (NAPROSYN) 500 MG tablet Take 1 tablet (500 mg total) by mouth 2 (two) times daily with a meal. 60 tablet 5  . predniSONE (DELTASONE) 20 MG tablet 2 po at sametime daily for 5 days 10 tablet 0  . promethazine (PHENERGAN) 12.5 MG tablet Take 1 tablet (12.5 mg total) by mouth every 8 (eight) hours as needed for nausea or vomiting. 20 tablet 0   No current facility-administered medications for this visit.      Physical Exam  Blood pressure 119/74, pulse (!) 55, temperature 98.4 F (36.9 C), height 5\' 8"  (1.727 m), weight 220 lb (99.8 kg).  Constitutional: overall normal hygiene, normal nutrition, well developed, normal grooming, normal  body habitus. Assistive device:none  Musculoskeletal: gait and station Limp none, muscle tone and strength are normal, no tremors or atrophy is present.  .  Neurological: coordination overall normal.  Deep tendon reflex/nerve stretch intact.  Sensation normal.  Cranial nerves II-XII intact.   Skin:   Normal overall no scars, lesions, ulcers or rashes. No psoriasis.  Psychiatric: Alert and oriented x 3.  Recent memory intact, remote memory unclear.  Normal mood and affect. Well groomed.  Good eye contact.  Cardiovascular: overall no swelling, no varicosities, no edema bilaterally, normal temperatures of the legs and arms, no clubbing, cyanosis and good capillary refill.  Lymphatic: palpation is normal.  Right hand has no swelling, negative exam.  Right shoulder has full motion, NV intact. All other systems reviewed and are negative   The patient has been educated about the nature of the problem(s) and counseled on treatment options.  The patient appeared to understand what I have discussed and is in agreement with it.  Encounter Diagnoses  Name Primary?  . Hand pain, right Yes  . Chronic right shoulder pain     PLAN Call if any problems.  Precautions discussed.  Continue current medications.   Return to clinic 3 months   Electronically Signed Sanjuana Kava, MD 6/4/20208:45 AM

## 2018-11-22 ENCOUNTER — Other Ambulatory Visit: Payer: Self-pay | Admitting: Nurse Practitioner

## 2018-11-22 ENCOUNTER — Other Ambulatory Visit: Payer: Self-pay | Admitting: Orthopaedic Surgery

## 2018-11-22 DIAGNOSIS — E785 Hyperlipidemia, unspecified: Secondary | ICD-10-CM

## 2018-12-13 ENCOUNTER — Telehealth: Payer: Self-pay | Admitting: Orthopaedic Surgery

## 2018-12-13 MED ORDER — HYDROCODONE-ACETAMINOPHEN 7.5-325 MG PO TABS: ORAL_TABLET | ORAL | 0 refills | Status: DC

## 2018-12-13 MED ORDER — DIAZEPAM 5 MG PO TABS: ORAL_TABLET | ORAL | 2 refills | Status: DC

## 2018-12-13 NOTE — Telephone Encounter (Signed)
Patient requests refill on Hydrocodone/Acetaminophen 7.5-325  Mgs.   Qty  110 °  °Sig: TAKE 1 TABLET EVERY 4 HOURS AS NEEDED FOR MODERATE PAIN °  °Patient states he uses [facility] °

## 2018-12-13 NOTE — Telephone Encounter (Signed)
Patient requests refill on Diazepam (Valium) 5 mgs.  Qty  60  2 Refills  Sig: TAKE (1) TABLET TWICE A DAY AS NEEDED FOR ARM SPASM  Patient states he uses [facility]

## 2019-01-08 ENCOUNTER — Other Ambulatory Visit: Payer: Self-pay | Admitting: Nurse Practitioner

## 2019-01-08 DIAGNOSIS — E785 Hyperlipidemia, unspecified: Secondary | ICD-10-CM

## 2019-01-08 DIAGNOSIS — I10 Essential (primary) hypertension: Secondary | ICD-10-CM

## 2019-01-10 NOTE — Telephone Encounter (Signed)
MMM. NTBS 30 days given 11/17/18

## 2019-01-11 ENCOUNTER — Other Ambulatory Visit: Payer: Self-pay

## 2019-01-13 ENCOUNTER — Ambulatory Visit (INDEPENDENT_AMBULATORY_CARE_PROVIDER_SITE_OTHER): Payer: Medicare Other | Admitting: Nurse Practitioner

## 2019-01-13 ENCOUNTER — Encounter: Payer: Self-pay | Admitting: Nurse Practitioner

## 2019-01-13 DIAGNOSIS — Z6834 Body mass index (BMI) 34.0-34.9, adult: Secondary | ICD-10-CM | POA: Diagnosis not present

## 2019-01-13 DIAGNOSIS — I10 Essential (primary) hypertension: Secondary | ICD-10-CM

## 2019-01-13 DIAGNOSIS — E785 Hyperlipidemia, unspecified: Secondary | ICD-10-CM

## 2019-01-13 DIAGNOSIS — M1A00X0 Idiopathic chronic gout, unspecified site, without tophus (tophi): Secondary | ICD-10-CM

## 2019-01-13 MED ORDER — LISINOPRIL 10 MG PO TABS: 10.0000 mg | ORAL_TABLET | Freq: Every day | ORAL | 1 refills | Status: DC

## 2019-01-13 MED ORDER — ALLOPURINOL 300 MG PO TABS: 300.0000 mg | ORAL_TABLET | Freq: Every day | ORAL | 1 refills | Status: DC

## 2019-01-13 MED ORDER — ATORVASTATIN CALCIUM 40 MG PO TABS: 40.0000 mg | ORAL_TABLET | Freq: Every day | ORAL | 1 refills | Status: DC

## 2019-01-13 MED ORDER — DIAZEPAM 5 MG PO TABS: ORAL_TABLET | ORAL | 2 refills | Status: DC

## 2019-01-13 NOTE — Progress Notes (Signed)
Virtual Visit via telephone Note Due to COVID-19 pandemic this visit was conducted virtually. This visit type was conducted due to national recommendations for restrictions regarding the COVID-19 Pandemic (e.g. social distancing, sheltering in place) in an effort to limit this patient's exposure and mitigate transmission in our community. All issues noted in this document were discussed and addressed.  A physical exam was not performed with this format.  I connected with Andre Hunt on 01/13/19 at 2:00 by telephone and verified that I am speaking with the correct person using two identifiers. Andre Hunt is currently located at home and no one is currently with her during visit. The provider, Mary-Margaret Hassell Done, FNP is located in their office at time of visit.  I discussed the limitations, risks, security and privacy concerns of performing an evaluation and management service by telephone and the availability of in person appointments. I also discussed with the patient that there may be a patient responsible charge related to this service. The patient expressed understanding and agreed to proceed.   History and Present Illness:   Chief Complaint: Medical Management of Chronic Issues    HPI:  1. Essential hypertension No c/o chest pain, sob or headache. Does not check blood pressure at home. BP Readings from Last 3 Encounters:  11/18/18 119/74  10/21/18 124/78  07/22/18 132/77     2. Hyperlipidemia with target LDL less than 100 Does not watch diet and does very litt;e exercise  3. Idiopathic chronic gout without tophus, unspecified site No recent gout fare ups. Has been over 6 months  4. BMI 34.0-34.9,adult Discussed diet and exercise for person with BMI >25 Will recheck weight in 3-6 months  5. Right shoulder and elbow pian Takes valium at night to hep her arm rest while sleeping.  Outpatient Encounter Medications as of 01/13/2019  Medication Sig  . allopurinol  (ZYLOPRIM) 300 MG tablet Take 1 tablet (300 mg total) by mouth daily.  Marland Kitchen atorvastatin (LIPITOR) 40 MG tablet Take 1 tablet (40 mg total) by mouth daily.  . benzonatate (TESSALON PERLES) 100 MG capsule Take 1 capsule (100 mg total) by mouth 3 (three) times daily as needed for cough.  . cetirizine (ZYRTEC) 10 MG tablet TAKE 1 TABLET DAILY  . colchicine 0.6 MG tablet One tid po for five days.  . diazepam (VALIUM) 5 MG tablet TAKE (1) TABLET TWICE A DAY AS NEEDED FOR ARM SPASM  . HYDROcodone-acetaminophen (NORCO) 7.5-325 MG tablet TAKE 1 TABLET EVERY 4 HOURS AS NEEDED FOR MODERATE PAIN  . lisinopril (ZESTRIL) 10 MG tablet Take 1 tablet (10 mg total) by mouth daily.  . naproxen (NAPROSYN) 500 MG tablet Take 1 tablet (500 mg total) by mouth 2 (two) times daily with a meal.  . predniSONE (DELTASONE) 20 MG tablet 2 po at sametime daily for 5 days  . promethazine (PHENERGAN) 12.5 MG tablet Take 1 tablet (12.5 mg total) by mouth every 8 (eight) hours as needed for nausea or vomiting.     Past Surgical History:  Procedure Laterality Date  . APPENDECTOMY  1988  . CARPAL TUNNEL RELEASE     bilateral 2000 and 2001  . COLONOSCOPY    . ELBOW SURGERY  2006   muscle repair  . EXPLORATORY LAPAROTOMY  1996   after falling on scissors; repair of "hole in intestines"  . [place]  . UMBILICAL HERNIA REPAIR  1968    Family History  Problem Relation Age of Onset  .  Kidney cancer Mother   . Diabetes Mother   . COPD Mother   . Anuerysm Father   . Diabetes Father   . Heart disease Father   . Diabetes Sister   . Colon cancer Neg Hx     New complaints: None today  Social history: Lives with his wife whom is suffering from severe chron's diseaseControlled substance contract: will have signed at next face to face visit    Review of Systems  Constitutional: Negative for diaphoresis and weight loss.  Eyes: Negative for blurred vision, double vision and pain.  Respiratory:  Negative for shortness of breath.   Cardiovascular: Negative for chest pain, palpitations, orthopnea and leg swelling.  Gastrointestinal: Negative for abdominal pain.  Skin: Negative for rash.  Neurological: Negative for dizziness, sensory change, loss of consciousness, weakness and headaches.  Endo/Heme/Allergies: Negative for polydipsia. Does not bruise/bleed easily.  Psychiatric/Behavioral: Negative for memory loss. The patient does not have insomnia.   All other systems reviewed and are negative.    Observations/Objective: Alert and oriented- answers all questions appropriately No distress noted in voice  Assessment and Plan: Andre Hunt comes in today with chief complaint of Medical Management of Chronic Issues   Diagnosis and orders addressed:  1. Essential hypertension Low sodium diet - lisinopril (ZESTRIL) 10 MG tablet; Take 1 tablet (10 mg total) by mouth daily.  Dispense: 90 tablet; Refill: 1  2. Hyperlipidemia with target LDL less than 100 Low fat diet - atorvastatin (LIPITOR) 40 MG tablet; Take 1 tablet (40 mg total) by mouth daily at 6 PM.  Dispense: 90 tablet; Refill: 1  3. 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  Dispense: 90 tablet; Refill: 1  4. BMI 34.0-34.9,adult Discussed diet and exercise for person with BMI >25 Will recheck weight in 3-6 months   Previous labs reviewed Health Maintenance reviewed Diet and exercise encouraged  Follow up plan: 3 months   I discussed the assessment and treatment plan with the patient. The patient was provided an opportunity to ask questions and all were answered. The patient agreed with the plan and demonstrated an understanding of the instructions.   The patient was advised to call back or seek an in-person evaluation if the symptoms worsen or if the condition fails to improve as anticipated.   The above assessment and management plan was discussed with the patient. The patient verbalized understanding of and has agreed to the management plan. Patient is aware to call the clinic if symptoms persist or worsen. Patient is aware when to return to the clinic for a follow-up visit. Patient educated on when it is appropriate to go to the emergency department.   Time call ended:  2:15  I provided 15 minutes of non-face-to-face time during this encounter.    Mary-Margaret Hassell Done, FNP

## 2019-01-19 ENCOUNTER — Telehealth: Payer: Self-pay | Admitting: Orthopedic Surgery

## 2019-01-19 MED ORDER — HYDROCODONE-ACETAMINOPHEN 7.5-325 MG PO TABS: ORAL_TABLET | ORAL | 0 refills | Status: DC

## 2019-01-19 NOTE — Telephone Encounter (Signed)
Patient requests refill on Hydrocodone/Acetaminophen 7.5-325  Mgs.   Qty  110 °  °Sig: TAKE 1 TABLET EVERY 4 HOURS AS NEEDED FOR MODERATE PAIN °  °Patient states he uses [facility] °

## 2019-02-07 ENCOUNTER — Other Ambulatory Visit: Payer: Self-pay | Admitting: Nurse Practitioner

## 2019-02-17 ENCOUNTER — Encounter: Payer: Self-pay | Admitting: Orthopaedic Surgery

## 2019-02-17 ENCOUNTER — Other Ambulatory Visit: Payer: Self-pay

## 2019-02-17 ENCOUNTER — Ambulatory Visit (INDEPENDENT_AMBULATORY_CARE_PROVIDER_SITE_OTHER): Payer: Medicare Other | Admitting: Orthopaedic Surgery

## 2019-02-17 ENCOUNTER — Telehealth: Payer: Self-pay | Admitting: Orthopaedic Surgery

## 2019-02-17 VITALS — BP 123/82 | HR 60 | Temp 97.0°F | Ht 68.0 in | Wt 226.0 lb

## 2019-02-17 DIAGNOSIS — M542 Cervicalgia: Secondary | ICD-10-CM | POA: Diagnosis not present

## 2019-02-17 DIAGNOSIS — G8929 Other chronic pain: Secondary | ICD-10-CM

## 2019-02-17 DIAGNOSIS — M25511 Pain in right shoulder: Secondary | ICD-10-CM

## 2019-02-17 MED ORDER — HYDROCODONE-ACETAMINOPHEN 7.5-325 MG PO TABS: ORAL_TABLET | ORAL | 0 refills | Status: DC

## 2019-02-17 NOTE — Progress Notes (Signed)
Patient Andre Hunt, male DOB:1961/01/12, 58 y.o. [id number]  Chief Complaint  Patient presents with  . Shoulder Pain    Chronic right shoulder pain.    HPI  Andre Hunt is a 58 y.o. male who has chronic right shoulder pain and chronic right elbow pain.  He is stable. He has good and bad days, more with the right shoulder.  He has pain with overhead use and rolling on it at night.  He is taking care of his wife who is very ill.     Body mass index is 34.36 kg/m.  ROS  Review of Systems  HENT: Negative for congestion.   Respiratory: Negative for cough and shortness of breath.   Cardiovascular: Negative for chest pain and leg swelling.  Endocrine: Positive for cold intolerance.  Musculoskeletal: Positive for arthralgias and joint swelling.  Allergic/Immunologic: Positive for environmental allergies.  Neurological: Negative for numbness.  All other systems reviewed and are negative.   All other systems reviewed and are negative.  The following is a summary of the past history medically, past history surgically, known current medicines, social history and family history.  This information is gathered electronically by the computer from prior information and documentation.  I review this each visit and have found including this information at this point in the chart is beneficial and informative.    Past Medical History:  Diagnosis Date  . Allergy   . Gout   . Hyperlipidemia   . Personal history of colonic adenoma 09/28/2012    Past Surgical History:  Procedure Laterality Date  . APPENDECTOMY  1988  . CARPAL TUNNEL RELEASE     bilateral 2000 and 2001  . COLONOSCOPY    . ELBOW SURGERY  2006   muscle repair  . EXPLORATORY LAPAROTOMY  1996   after falling on scissors; repair of "hole in intestines"  . [place]  . UMBILICAL HERNIA REPAIR  1968    Family History  Problem Relation Age of Onset  . Kidney cancer Mother   . Diabetes  Mother   . COPD Mother   . Anuerysm Father   . Diabetes Father   . Heart disease Father   . Diabetes Sister   . Colon cancer Neg Hx     Social History Social History   Tobacco Use  . Smoking status: Never Smoker  . Smokeless tobacco: Current User    Types: Snuff  Substance Use Topics  . Alcohol use: No  . Drug use: No    Allergies  Allergen Reactions  . Iohexol Anaphylaxis    Allergy to IVP dye    Current Outpatient Medications  Medication Sig Dispense Refill  . allopurinol (ZYLOPRIM) 300 MG tablet Take 1 tablet (300 mg total) by mouth daily. 90 tablet 1  . atorvastatin (LIPITOR) 40 MG tablet Take 1 tablet (40 mg total) by mouth daily at 6 PM. 90 tablet 1  . benzonatate (TESSALON PERLES) 100 MG capsule Take 1 capsule (100 mg total) by mouth 3 (three) times daily as needed for cough. 20 capsule 0  . cetirizine (ZYRTEC) 10 MG tablet TAKE 1 TABLET DAILY 90 tablet 3  . colchicine 0.6 MG tablet One tid po for five days. 15 tablet 5  . diazepam (VALIUM) 5 MG tablet TAKE (1) TABLET TWICE A DAY AS NEEDED FOR ARM SPASM 60 tablet 2  . HYDROcodone-acetaminophen (NORCO) 7.5-325 MG tablet TAKE 1 TABLET EVERY 4 HOURS AS NEEDED FOR MODERATE PAIN 110 tablet  0  . lisinopril (ZESTRIL) 10 MG tablet Take 1 tablet (10 mg total) by mouth daily. 90 tablet 1  . naproxen (NAPROSYN) 500 MG tablet Take 1 tablet (500 mg total) by mouth 2 (two) times daily with a meal. 60 tablet 5  . predniSONE (DELTASONE) 20 MG tablet 2 po at sametime daily for 5 days 10 tablet 0  . promethazine (PHENERGAN) 12.5 MG tablet Take 1 tablet (12.5 mg total) by mouth every 8 (eight) hours as needed for nausea or vomiting. 20 tablet 0   No current facility-administered medications for this visit.      Physical Exam  Blood pressure 123/82, pulse 60, temperature (!) 97 F (36.1 C), height 5\' 8"  (1.727 m), weight 226 lb (102.5 kg).  Constitutional: overall normal hygiene, normal nutrition, well developed, normal grooming,  normal body habitus. Assistive device:none  Musculoskeletal: gait and station Limp none, muscle tone and strength are normal, no tremors or atrophy is present.  .  Neurological: coordination overall normal.  Deep tendon reflex/nerve stretch intact.  Sensation normal.  Cranial nerves II-XII intact.   Skin:   Normal overall no scars, lesions, ulcers or rashes. No psoriasis.  Psychiatric: Alert and oriented x 3.  Recent memory intact, remote memory unclear.  Normal mood and affect. Well groomed.  Good eye contact.  Cardiovascular: overall no swelling, no varicosities, no edema bilaterally, normal temperatures of the legs and arms, no clubbing, cyanosis and good capillary refill.  Lymphatic: palpation is normal.  Right shoulder has near full ROM but pain in the extremes.  NV intact.  All other systems reviewed and are negative   The patient has been educated about the nature of the problem(s) and counseled on treatment options.  The patient appeared to understand what I have discussed and is in agreement with it.  Encounter Diagnoses  Name Primary?  . Chronic right shoulder pain Yes  . Neck pain     PLAN Call if any problems.  Precautions discussed.  Continue current medications.   Return to clinic 3 months   Electronically Signed Sanjuana Kava, MD 9/3/20208:47 AM

## 2019-02-17 NOTE — Telephone Encounter (Signed)
Patient now states does need refill: HYDROcodone-acetaminophen (NORCO) 7.5-325 MG tablet  [facility]

## 2019-03-22 ENCOUNTER — Telehealth: Payer: Self-pay

## 2019-03-22 NOTE — Telephone Encounter (Signed)
Hydrocodone-Acetaminophen  7.5/325 mg  Qty 110 Tablets ° °PATIENT USES [facility] °

## 2019-03-24 ENCOUNTER — Other Ambulatory Visit: Payer: Self-pay | Admitting: Orthopaedic Surgery

## 2019-03-28 ENCOUNTER — Telehealth: Payer: Self-pay | Admitting: Orthopaedic Surgery

## 2019-03-28 NOTE — Telephone Encounter (Signed)
°  Hydrocodone-Acetaminophen  7.5/325mg   Qty  110 Tablets  PATIENT USES [facility]

## 2019-03-29 MED ORDER — HYDROCODONE-ACETAMINOPHEN 7.5-325 MG PO TABS: ORAL_TABLET | ORAL | 0 refills | Status: DC

## 2019-04-15 ENCOUNTER — Other Ambulatory Visit: Payer: Self-pay

## 2019-04-18 ENCOUNTER — Ambulatory Visit (INDEPENDENT_AMBULATORY_CARE_PROVIDER_SITE_OTHER): Payer: Medicare Other | Admitting: Nurse Practitioner

## 2019-04-18 ENCOUNTER — Other Ambulatory Visit: Payer: Self-pay

## 2019-04-18 ENCOUNTER — Encounter: Payer: Self-pay | Admitting: Nurse Practitioner

## 2019-04-18 VITALS — BP 118/73 | HR 61 | Temp 98.9°F | Resp 20 | Ht 68.0 in | Wt 233.0 lb

## 2019-04-18 DIAGNOSIS — M1A00X0 Idiopathic chronic gout, unspecified site, without tophus (tophi): Secondary | ICD-10-CM

## 2019-04-18 DIAGNOSIS — F411 Generalized anxiety disorder: Secondary | ICD-10-CM | POA: Diagnosis not present

## 2019-04-18 DIAGNOSIS — I10 Essential (primary) hypertension: Secondary | ICD-10-CM | POA: Diagnosis not present

## 2019-04-18 DIAGNOSIS — Z6834 Body mass index (BMI) 34.0-34.9, adult: Secondary | ICD-10-CM

## 2019-04-18 DIAGNOSIS — E785 Hyperlipidemia, unspecified: Secondary | ICD-10-CM

## 2019-04-18 DIAGNOSIS — Z125 Encounter for screening for malignant neoplasm of prostate: Secondary | ICD-10-CM

## 2019-04-18 MED ORDER — ALLOPURINOL 300 MG PO TABS: 300.0000 mg | ORAL_TABLET | Freq: Every day | ORAL | 1 refills | Status: DC

## 2019-04-18 MED ORDER — ATORVASTATIN CALCIUM 40 MG PO TABS: 40.0000 mg | ORAL_TABLET | Freq: Every day | ORAL | 1 refills | Status: DC

## 2019-04-18 MED ORDER — LISINOPRIL 10 MG PO TABS: 10.0000 mg | ORAL_TABLET | Freq: Every day | ORAL | 1 refills | Status: DC

## 2019-04-18 MED ORDER — DIAZEPAM 5 MG PO TABS: ORAL_TABLET | ORAL | 2 refills | Status: DC

## 2019-04-18 NOTE — Patient Instructions (Signed)

## 2019-04-18 NOTE — Progress Notes (Signed)
Subjective:    Patient ID: Andre Hunt, male    DOB: 28-Oct-1960, 58 y.o.   MRN: [id number]   Chief Complaint: Medical Management of Chronic Issues    HPI:  1. Essential hypertension No c/o chest pain, sob or headache. Does not check at home. BP Readings from Last 3 Encounters:  04/18/19 118/73  02/17/19 123/82  11/18/18 119/74     2. Hyperlipidemia with target LDL less than 100 Does not watch diet and does very little exercise  3. Idiopathic chronic gout without tophus, unspecified site No recent gout flare ups.  4. BMI 34.0-34.9,adult No recent weight changes Wt Readings from Last 3 Encounters:  04/18/19 233 lb (105.7 kg)  02/17/19 226 lb (102.5 kg)  11/18/18 220 lb (99.8 kg)    BMI Readings from Last 3 Encounters:  04/18/19 35.43 kg/m  02/17/19 34.36 kg/m  11/18/18 33.45 kg/m   5. GAD He is on diazepam for years which helps him to stay calm. He still worries a lot. GAD 7 : Generalized Anxiety Score 04/18/2019  Nervous, Anxious, on Edge 3  Control/stop worrying 0  Worry too much - different things 0  Trouble relaxing 3  Restless 3  Easily annoyed or irritable 3  Afraid - awful might happen 3  Total GAD 7 Score 15      Outpatient Encounter Medications as of 04/18/2019  Medication Sig  . allopurinol (ZYLOPRIM) 300 MG tablet Take 1 tablet (300 mg total) by mouth daily.  Marland Kitchen atorvastatin (LIPITOR) 40 MG tablet Take 1 tablet (40 mg total) by mouth daily at 6 PM.  . cetirizine (ZYRTEC) 10 MG tablet TAKE 1 TABLET DAILY  . colchicine 0.6 MG tablet One tid po for five days.  . diazepam (VALIUM) 5 MG tablet TAKE (1) TABLET TWICE A DAY AS NEEDED FOR ARM SPASM  . HYDROcodone-acetaminophen (NORCO) 7.5-325 MG tablet TAKE 1 TABLET EVERY 4 HOURS AS NEEDED FOR MODERATE PAIN  . lisinopril (ZESTRIL) 10 MG tablet Take 1 tablet (10 mg total) by mouth daily.  . naproxen (NAPROSYN) 500 MG tablet Take 1 tablet (500 mg total) by mouth 2 (two) times daily with a meal.  .  promethazine (PHENERGAN) 12.5 MG tablet Take 1 tablet (12.5 mg total) by mouth every 8 (eight) hours as needed for nausea or vomiting.     Past Surgical History:  Procedure Laterality Date  . APPENDECTOMY  1988  . CARPAL TUNNEL RELEASE     bilateral 2000 and 2001  . COLONOSCOPY    . ELBOW SURGERY  2006   muscle repair  . EXPLORATORY LAPAROTOMY  1996   after falling on scissors; repair of "hole in intestines"  . [place]  . UMBILICAL HERNIA REPAIR  1968    Family History  Problem Relation Age of Onset  . Kidney cancer Mother   . Diabetes Mother   . COPD Mother   . Anuerysm Father   . Diabetes Father   . Heart disease Father   . Diabetes Sister   . Colon cancer Neg Hx     New complaints: None today  Social history: His wife just got diagnosed with kidney cancer. She has a lot of medical problems and this just adds to his stress.  Controlled substance contract: 04/18/19    Review of Systems  Constitutional: Negative for activity change and appetite change.  HENT: Negative.   Eyes: Negative for pain.  Respiratory: Negative for shortness of breath.   Cardiovascular: Negative  for chest pain, palpitations and leg swelling.  Gastrointestinal: Negative for abdominal pain.  Endocrine: Negative for polydipsia.  Genitourinary: Negative.   Skin: Negative for rash.  Neurological: Negative for dizziness, weakness and headaches.  Hematological: Does not bruise/bleed easily.  Psychiatric/Behavioral: Negative.   All other systems reviewed and are negative.      Objective:   Physical Exam Vitals signs and nursing note reviewed.  Constitutional:      Appearance: Normal appearance. He is well-developed.  HENT:     Head: Normocephalic.     Nose: Nose normal.  Eyes:     Pupils: Pupils are equal, round, and reactive to light.  Neck:     Musculoskeletal: Normal range of motion and neck supple.     Thyroid: No thyroid mass or thyromegaly.     Vascular:  No carotid bruit or JVD.     Trachea: Phonation normal.  Cardiovascular:     Rate and Rhythm: Normal rate and regular rhythm.  Pulmonary:     Effort: Pulmonary effort is normal. No respiratory distress.     Breath sounds: Normal breath sounds.  Abdominal:     General: Bowel sounds are normal.     Palpations: Abdomen is soft.     Tenderness: There is no abdominal tenderness.  Musculoskeletal: Normal range of motion.  Lymphadenopathy:     Cervical: No cervical adenopathy.  Skin:    General: Skin is warm and dry.  Neurological:     Mental Status: He is alert and oriented to person, place, and time.  Psychiatric:        Behavior: Behavior normal.        Thought Content: Thought content normal.        Judgment: Judgment normal.    BP 118/73   Pulse 61   Temp 98.9 F (37.2 C) (Temporal)   Resp 20   Ht 5' 8" (1.727 m)   Wt 233 lb (105.7 kg)   SpO2 92%   BMI 35.43 kg/m        Assessment & Plan:  [name] comes in today with chief complaint of Medical Management of Chronic Issues   Diagnosis and orders addressed:  1. Essential hypertension Low sodium diet - lisinopril (ZESTRIL) 10 MG tablet; Take 1 tablet (10 mg total) by mouth daily.  Dispense: 90 tablet; Refill: 1 - CMP14+EGFR  2. Hyperlipidemia with target LDL less than 100 Low fat diet - atorvastatin (LIPITOR) 40 MG tablet; Take 1 tablet (40 mg total) by mouth daily at 6 PM.  Dispense: 90 tablet; Refill: 1 - Lipid panel  3. 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  Dispense: 90 tablet; Refill: 1  4. GAD (generalized anxiety disorder) Stress management  5. BMI 34.0-34.9,adult Discussed diet and exercise for person with BMI >25 Will recheck weight in 3-6 months  6. Prostate cancer screening - PSA   Labs pending Health Maintenance reviewed Diet and exercise encouraged  Follow  up plan: 3 months   Mary-Margaret Hassell Done, FNP

## 2019-04-19 LAB — CMP14+EGFR
ALT: 40 IU/L (ref 0–44)
AST: 22 IU/L (ref 0–40)
Albumin/Globulin Ratio: 1.9 (ref 1.2–2.2)
Albumin: 4.2 g/dL (ref 3.8–4.9)
Alkaline Phosphatase: 65 IU/L (ref 39–117)
BUN/Creatinine Ratio: 14 (ref 9–20)
BUN: 16 mg/dL (ref 6–24)
Bilirubin Total: 0.5 mg/dL (ref 0.0–1.2)
CO2: 26 mmol/L (ref 20–29)
Calcium: 9.2 mg/dL (ref 8.7–10.2)
Chloride: 104 mmol/L (ref 96–106)
Creatinine, Ser: 1.11 mg/dL (ref 0.76–1.27)
GFR calc Af Amer: 85 mL/min/{1.73_m2} (ref >59)
GFR calc non Af Amer: 73 mL/min/{1.73_m2} (ref >59)
Globulin, Total: 2.2 g/dL (ref 1.5–4.5)
Glucose: 100 mg/dL — ABNORMAL HIGH (ref 65–99)
Potassium: 4.9 mmol/L (ref 3.5–5.2)
Sodium: 142 mmol/L (ref 134–144)
Total Protein: 6.4 g/dL (ref 6.0–8.5)

## 2019-04-19 LAB — LIPID PANEL
Chol/HDL Ratio: 2.7 ratio (ref 0.0–5.0)
Cholesterol, Total: 110 mg/dL (ref 100–199)
HDL: 41 mg/dL (ref >39)
LDL Chol Calc (NIH): 52 mg/dL (ref 0–99)
Triglycerides: 86 mg/dL (ref 0–149)
VLDL Cholesterol Cal: 17 mg/dL (ref 5–40)

## 2019-04-19 LAB — PSA: Prostate Specific Ag, Serum: 0.8 ng/mL (ref 0.0–4.0)

## 2019-05-03 ENCOUNTER — Telehealth: Payer: Self-pay | Admitting: Orthopaedic Surgery

## 2019-05-03 MED ORDER — HYDROCODONE-ACETAMINOPHEN 7.5-325 MG PO TABS: ORAL_TABLET | ORAL | 0 refills | Status: DC

## 2019-05-03 NOTE — Telephone Encounter (Signed)
Patient requests refill on Hydrocodone/Acetaminophen 7.5-325  Mgs.   Qty  110 °  °Sig: TAKE 1 TABLET EVERY 4 HOURS AS NEEDED FOR MODERATE PAIN °  °Patient states he uses [facility] °

## 2019-05-19 ENCOUNTER — Encounter: Payer: Self-pay | Admitting: Orthopaedic Surgery

## 2019-05-19 ENCOUNTER — Other Ambulatory Visit: Payer: Self-pay

## 2019-05-19 ENCOUNTER — Ambulatory Visit (INDEPENDENT_AMBULATORY_CARE_PROVIDER_SITE_OTHER): Payer: Medicare Other | Admitting: Orthopaedic Surgery

## 2019-05-19 VITALS — BP 134/77 | HR 61 | Temp 98.2°F | Ht 68.0 in | Wt 229.0 lb

## 2019-05-19 DIAGNOSIS — M542 Cervicalgia: Secondary | ICD-10-CM | POA: Diagnosis not present

## 2019-05-19 DIAGNOSIS — M25511 Pain in right shoulder: Secondary | ICD-10-CM | POA: Diagnosis not present

## 2019-05-19 DIAGNOSIS — G8929 Other chronic pain: Secondary | ICD-10-CM | POA: Diagnosis not present

## 2019-05-19 NOTE — Progress Notes (Signed)
Patient [id number] Andre Hunt, male DOB:1961/02/16, 58 y.o. [id number]  Chief Complaint  Patient presents with  . Shoulder Pain    HPI  Andre Hunt is a 58 y.o. male who has chronic shoulder pain on the right and right chronic lateral epicondylitis.  He has more pain with the cold weather.  His wife has cancer and he is the caregiver and the stress of that does not help.  He is taking his medicine and doing his exercises.   Body mass index is 34.82 kg/m.  ROS  Review of Systems  HENT: Negative for congestion.   Respiratory: Negative for cough and shortness of breath.   Cardiovascular: Negative for chest pain and leg swelling.  Endocrine: Positive for cold intolerance.  Musculoskeletal: Positive for arthralgias and joint swelling.  Allergic/Immunologic: Positive for environmental allergies.  Neurological: Negative for numbness.  All other systems reviewed and are negative.   All other systems reviewed and are negative.  The following is a summary of the past history medically, past history surgically, known current medicines, social history and family history.  This information is gathered electronically by the computer from prior information and documentation.  I review this each visit and have found including this information at this point in the chart is beneficial and informative.    Past Medical History:  Diagnosis Date  . Allergy   . Gout   . Hyperlipidemia   . Personal history of colonic adenoma 09/28/2012    Past Surgical History:  Procedure Laterality Date  . APPENDECTOMY  1988  . CARPAL TUNNEL RELEASE     bilateral 2000 and 2001  . COLONOSCOPY    . ELBOW SURGERY  2006   muscle repair  . EXPLORATORY LAPAROTOMY  1996   after falling on scissors; repair of "hole in intestines"  . [place]  . UMBILICAL HERNIA REPAIR  1968    Family History  Problem Relation Age of Onset  . Kidney cancer Mother   . Diabetes Mother   . COPD Mother    . Anuerysm Father   . Diabetes Father   . Heart disease Father   . Diabetes Sister   . Colon cancer Neg Hx     Social History Social History   Tobacco Use  . Smoking status: Never Smoker  . Smokeless tobacco: Current User    Types: Snuff  Substance Use Topics  . Alcohol use: No  . Drug use: No    Allergies  Allergen Reactions  . Iohexol Anaphylaxis    Allergy to IVP dye    Current Outpatient Medications  Medication Sig Dispense Refill  . allopurinol (ZYLOPRIM) 300 MG tablet Take 1 tablet (300 mg total) by mouth daily. 90 tablet 1  . atorvastatin (LIPITOR) 40 MG tablet Take 1 tablet (40 mg total) by mouth daily at 6 PM. 90 tablet 1  . cetirizine (ZYRTEC) 10 MG tablet TAKE 1 TABLET DAILY 90 tablet 3  . colchicine 0.6 MG tablet One tid po for five days. 15 tablet 5  . diazepam (VALIUM) 5 MG tablet TAKE (1) TABLET TWICE A DAY AS NEEDED FOR ARM SPASM 60 tablet 2  . HYDROcodone-acetaminophen (NORCO) 7.5-325 MG tablet TAKE 1 TABLET EVERY 4 HOURS AS NEEDED FOR MODERATE PAIN 110 tablet 0  . lisinopril (ZESTRIL) 10 MG tablet Take 1 tablet (10 mg total) by mouth daily. 90 tablet 1  . naproxen (NAPROSYN) 500 MG tablet Take 1 tablet (500 mg total) by mouth 2 (two)  times daily with a meal. 60 tablet 5  . promethazine (PHENERGAN) 12.5 MG tablet Take 1 tablet (12.5 mg total) by mouth every 8 (eight) hours as needed for nausea or vomiting. 20 tablet 0   No current facility-administered medications for this visit.      Physical Exam  Blood pressure 134/77, pulse 61, temperature 98.2 F (36.8 C), height 5\' 8"  (1.727 m), weight 229 lb (103.9 kg).  Constitutional: overall normal hygiene, normal nutrition, well developed, normal grooming, normal body habitus. Assistive device:none  Musculoskeletal: gait and station Limp none, muscle tone and strength are normal, no tremors or atrophy is present.  .  Neurological: coordination overall normal.  Deep tendon reflex/nerve stretch intact.   Sensation normal.  Cranial nerves II-XII intact.   Skin:   Normal overall no scars, lesions, ulcers or rashes. No psoriasis.  Psychiatric: Alert and oriented x 3.  Recent memory intact, remote memory unclear.  Normal mood and affect. Well groomed.  Good eye contact.  Cardiovascular: overall no swelling, no varicosities, no edema bilaterally, normal temperatures of the legs and arms, no clubbing, cyanosis and good capillary refill.  Lymphatic: palpation is normal.  His right shoulder has full motion today but pain in the extremes.  He has tenderness over the right lateral epicondyle.  NV intact.  All other systems reviewed and are negative   The patient has been educated about the nature of the problem(s) and counseled on treatment options.  The patient appeared to understand what I have discussed and is in agreement with it.  Encounter Diagnoses  Name Primary?  . Chronic right shoulder pain Yes  . Neck pain     PLAN Call if any problems.  Precautions discussed.  Continue current medications.   Return to clinic 3 months   Electronically Signed Sanjuana Kava, MD 12/3/20208:49 AM

## 2019-06-02 ENCOUNTER — Telehealth: Payer: Self-pay | Admitting: Orthopaedic Surgery

## 2019-06-02 NOTE — Telephone Encounter (Signed)
Patient requests refill on Hydrocodone/Acetaminophen 7.5-325  Mgs.  Qty  110  Sig: TAKE 1 TABLET EVERY 4 HOURS AS NEEDED FOR MODERATE PAIN  Patient uses [facility] in [place]

## 2019-06-04 MED ORDER — HYDROCODONE-ACETAMINOPHEN 7.5-325 MG PO TABS: ORAL_TABLET | ORAL | 0 refills | Status: DC

## 2019-07-12 ENCOUNTER — Telehealth: Payer: Self-pay | Admitting: Orthopaedic Surgery

## 2019-07-12 MED ORDER — HYDROCODONE-ACETAMINOPHEN 7.5-325 MG PO TABS: ORAL_TABLET | ORAL | 0 refills | Status: DC

## 2019-07-12 NOTE — Telephone Encounter (Signed)
Patient requests refill on Hydrocodone/Acetaminphen 7.5-325   Mgs.  Qty  110  Sig: TAKE 1 TABLET EVERY 4 HOURS AS NEEDED FOR MODERATE PAIN  He states he uses [facility]

## 2019-07-21 ENCOUNTER — Ambulatory Visit (INDEPENDENT_AMBULATORY_CARE_PROVIDER_SITE_OTHER): Payer: Medicare Other | Admitting: Nurse Practitioner

## 2019-07-21 ENCOUNTER — Encounter: Payer: Self-pay | Admitting: Nurse Practitioner

## 2019-07-21 DIAGNOSIS — Z86010 Personal history of colonic polyps: Secondary | ICD-10-CM

## 2019-07-21 DIAGNOSIS — E785 Hyperlipidemia, unspecified: Secondary | ICD-10-CM

## 2019-07-21 DIAGNOSIS — Z6834 Body mass index (BMI) 34.0-34.9, adult: Secondary | ICD-10-CM

## 2019-07-21 DIAGNOSIS — F411 Generalized anxiety disorder: Secondary | ICD-10-CM

## 2019-07-21 DIAGNOSIS — M1A00X0 Idiopathic chronic gout, unspecified site, without tophus (tophi): Secondary | ICD-10-CM | POA: Diagnosis not present

## 2019-07-21 DIAGNOSIS — I10 Essential (primary) hypertension: Secondary | ICD-10-CM | POA: Diagnosis not present

## 2019-07-21 MED ORDER — ATORVASTATIN CALCIUM 40 MG PO TABS: 40.0000 mg | ORAL_TABLET | Freq: Every day | ORAL | 1 refills | Status: DC

## 2019-07-21 MED ORDER — DIAZEPAM 5 MG PO TABS: ORAL_TABLET | ORAL | 2 refills | Status: DC

## 2019-07-21 MED ORDER — LISINOPRIL 10 MG PO TABS: 10.0000 mg | ORAL_TABLET | Freq: Every day | ORAL | 1 refills | Status: DC

## 2019-07-21 MED ORDER — ALLOPURINOL 300 MG PO TABS: 300.0000 mg | ORAL_TABLET | Freq: Every day | ORAL | 1 refills | Status: DC

## 2019-07-21 NOTE — Progress Notes (Signed)
Virtual Visit via telephone Note Due to COVID-19 pandemic this visit was conducted virtually. This visit type was conducted due to national recommendations for restrictions regarding the COVID-19 Pandemic (e.g. social distancing, sheltering in place) in an effort to limit this patient's exposure and mitigate transmission in our community. All issues noted in this document were discussed and addressed.  A physical exam was not performed with this format.  I connected with Andre Hunt on 07/21/19 at 11:55 by telephone and verified that I am speaking with the correct person using two identifiers. Andre Hunt is currently located at [facility] in [place] and no ne is currently with him during visit. The provider, Mary-Margaret Hassell Done, FNP is located in their office at time of visit.  I discussed the limitations, risks, security and privacy concerns of performing an evaluation and management service by telephone and the availability of in person appointments. I also discussed with the patient that there may be a patient responsible charge related to this service. The patient expressed understanding and agreed to proceed.   History and Present Illness:   Chief Complaint: Medical Management of Chronic Issues    HPI:  1. Essential hypertension No c/o chest pain, sob or headache. Does not check blood pressure at home. BP Readings from Last 3 Encounters:  05/19/19 134/77  04/18/19 118/73  02/17/19 123/82     2. Hyperlipidemia with target LDL less than 100 Does not watch diet and does no exercise. Lab Results  Component Value Date   CHOL 110 04/18/2019   HDL 41 04/18/2019   LDLCALC 52 04/18/2019   TRIG 86 04/18/2019   CHOLHDL 2.7 04/18/2019     3. 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 Personal history of colonic adenoma He has niot had repeat colonoscopy since 2014- needs referral  5. GAD (generalized anxiety disorder) Is  under a lot of stress due to wife recent dx of cancer. He takes valium BID. GAD 7 : Generalized Anxiety Score 07/21/2019 04/18/2019  Nervous, Anxious, on Edge 1 3  Control/stop worrying 2 0  Worry too much - different things 2 0  Trouble relaxing 2 3  Restless 2 3  Easily annoyed or irritable 2 3  Afraid - awful might happen 0 3  Total GAD 7 Score 11 15  Anxiety Difficulty Somewhat difficult -      6. BMI 34.0-34.9,adult No recent weight changes Wt Readings from Last 3 Encounters:  05/19/19 229 lb (103.9 kg)  04/18/19 233 lb (105.7 kg)  02/17/19 226 lb (102.5 kg)   BMI Readings from Last 3 Encounters:  05/19/19 34.82 kg/m  04/18/19 35.43 kg/m  02/17/19 34.36 kg/m      Outpatient Encounter Medications as of 07/21/2019  Medication Sig  . allopurinol (ZYLOPRIM) 300 MG tablet Take 1 tablet (300 mg total) by mouth daily.  Marland Kitchen atorvastatin (LIPITOR) 40 MG tablet Take 1 tablet (40 mg total) by mouth daily at 6 PM.  . cetirizine (ZYRTEC) 10 MG tablet TAKE 1 TABLET DAILY  . colchicine 0.6 MG tablet One tid po for five days.  . diazepam (VALIUM) 5 MG tablet TAKE (1) TABLET TWICE A DAY AS NEEDED FOR ARM SPASM  . HYDROcodone-acetaminophen (NORCO) 7.5-325 MG tablet TAKE 1 TABLET EVERY 4 HOURS AS NEEDED FOR MODERATE PAIN  . lisinopril (ZESTRIL) 10 MG tablet Take 1 tablet (10 mg total) by mouth daily.  . naproxen (NAPROSYN) 500 MG tablet Take 1 tablet (500  mg total) by mouth 2 (two) times daily with a meal.  . promethazine (PHENERGAN) 12.5 MG tablet Take 1 tablet (12.5 mg total) by mouth every 8 (eight) hours as needed for nausea or vomiting.     Past Surgical History:  Procedure Laterality Date  . APPENDECTOMY  1988  . CARPAL TUNNEL RELEASE     bilateral 2000 and 2001  . COLONOSCOPY    . ELBOW SURGERY  2006   muscle repair  . EXPLORATORY LAPAROTOMY  1996   after falling on scissors; repair of "hole in intestines"  . [place]  . UMBILICAL HERNIA REPAIR  1968      Family History  Problem Relation Age of Onset  . Kidney cancer Mother   . Diabetes Mother   . COPD Mother   . Anuerysm Father   . Diabetes Father   . Heart disease Father   . Diabetes Sister   . Colon cancer Neg Hx     New complaints: None today  Social history: Lives with wife and her caregiver  Controlled substance contract: will have signed at next visit    Review of Systems  Constitutional: Negative for diaphoresis and weight loss.  Eyes: Negative for blurred vision, double vision and pain.  Respiratory: Negative for shortness of breath.   Cardiovascular: Negative for chest pain, palpitations, orthopnea and leg swelling.  Gastrointestinal: Negative for abdominal pain.  Skin: Negative for rash.  Neurological: Negative for dizziness, sensory change, loss of consciousness, weakness and headaches.  Endo/Heme/Allergies: Negative for polydipsia. Does not bruise/bleed easily.  Psychiatric/Behavioral: Negative for memory loss. The patient does not have insomnia.   All other systems reviewed and are negative.    Observations/Objective: Alert and oriented- answers all questions appropriately No distress    Assessment and Plan: Andre Hunt comes in today with chief complaint of Medical Management of Chronic Issues   Diagnosis and orders addressed:  1. Essential hypertension Low sodium diet - lisinopril (ZESTRIL) 10 MG tablet; Take 1 tablet (10 mg total) by mouth daily.  Dispense: 90 tablet; Refill: 1  2. Hyperlipidemia with target LDL less than 100 Low fat diet - atorvastatin (LIPITOR) 40 MG tablet; Take 1 tablet (40 mg total) by mouth daily at 6 PM.  Dispense: 90 tablet; Refill: 1  3. 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  Dispense: 90 tablet; Refill: 1  4. Personal history of colonic adenoma Will get scheduled for colonoscopy - Ambulatory referral  to Gastroenterology  5. GAD (generalized anxiety disorder) Stress management - diazepam (VALIUM) 5 MG tablet; TAKE (1) TABLET TWICE A DAY AS NEEDED FOR ARM SPASM  Dispense: 60 tablet; Refill: 2  6. BMI 34.0-34.9,adult Discussed diet and exercise for person with BMI >25 Will recheck weight in 3-6 months   Labs pending Health Maintenance reviewed Diet and exercise encouraged  Follow up plan: 3 months    I discussed the assessment and treatment plan with the patient. The patient was provided an opportunity to ask questions and all were answered. The patient agreed with the plan and demonstrated an understanding of the instructions.   The patient was advised to call back or seek an in-person evaluation if the symptoms worsen or if the condition fails to improve as anticipated.  The above assessment and management plan was discussed with the patient. The patient verbalized understanding of and has agreed to the management plan.  Patient is aware to call the clinic if symptoms persist or worsen. Patient is aware when to return to the clinic for a follow-up visit. Patient educated on when it is appropriate to go to the emergency department.   Time call ended:  12:12  I provided 17 minutes of non-face-to-face time during this encounter.    Mary-Margaret Hassell Done, FNP

## 2019-07-22 ENCOUNTER — Encounter: Payer: Self-pay | Admitting: Internal Medicine

## 2019-08-09 ENCOUNTER — Ambulatory Visit (AMBULATORY_SURGERY_CENTER): Payer: Self-pay

## 2019-08-09 ENCOUNTER — Other Ambulatory Visit: Payer: Self-pay

## 2019-08-09 VITALS — Temp 97.3°F | Ht 68.0 in | Wt 225.0 lb

## 2019-08-09 DIAGNOSIS — Z01818 Encounter for other preprocedural examination: Secondary | ICD-10-CM

## 2019-08-09 DIAGNOSIS — Z86010 Personal history of colonic polyps: Secondary | ICD-10-CM

## 2019-08-09 NOTE — Progress Notes (Signed)
No egg or soy allergy known to patient  No issues with past sedation with any surgeries  or procedures, no intubation problems  No diet pills per patient No home 02 use per patient  No blood thinners per patient  Pt denies issues with constipation  No A fib or A flutter  EMMI video sent to pt's e mail   Due to the COVID-19 pandemic we are asking patients to follow these guidelines. Please only bring one care partner. Please be aware that your care partner may wait in the car in the parking lot or if they feel like they will be too hot to wait in the car, they may wait in the lobby on the 4th floor. All care partners are required to wear a mask the entire time (we do not have any that we can provide them), they need to practice social distancing, and we will do a Covid check for all patient's and care partners when you arrive. Also we will check their temperature and your temperature. If the care partner waits in their car they need to stay in the parking lot the entire time and we will call them on their cell phone when the patient is ready for discharge so they can bring the car to the front of the building. Also all patient's will need to wear a mask into building.  Care partner 5.8

## 2019-08-18 ENCOUNTER — Other Ambulatory Visit (HOSPITAL_COMMUNITY)
Admission: RE | Admit: 2019-08-18 | Discharge: 2019-08-18 | Disposition: A | Discharge status: Home / Self Care | Payer: Medicare Other | Source: Ambulatory Visit | Attending: Internal Medicine | Admitting: Internal Medicine

## 2019-08-18 ENCOUNTER — Encounter: Payer: Self-pay | Admitting: Internal Medicine

## 2019-08-18 ENCOUNTER — Encounter: Payer: Self-pay | Admitting: Orthopaedic Surgery

## 2019-08-18 ENCOUNTER — Ambulatory Visit (INDEPENDENT_AMBULATORY_CARE_PROVIDER_SITE_OTHER): Payer: Medicare Other | Admitting: Orthopaedic Surgery

## 2019-08-18 ENCOUNTER — Other Ambulatory Visit: Payer: Self-pay

## 2019-08-18 ENCOUNTER — Other Ambulatory Visit (HOSPITAL_COMMUNITY): Payer: Medicare Other

## 2019-08-18 VITALS — BP 152/64 | HR 64 | Ht 68.0 in | Wt 229.5 lb

## 2019-08-18 DIAGNOSIS — G8929 Other chronic pain: Secondary | ICD-10-CM

## 2019-08-18 DIAGNOSIS — M542 Cervicalgia: Secondary | ICD-10-CM

## 2019-08-18 DIAGNOSIS — M25511 Pain in right shoulder: Secondary | ICD-10-CM | POA: Diagnosis not present

## 2019-08-18 DIAGNOSIS — Z01812 Encounter for preprocedural laboratory examination: Secondary | ICD-10-CM | POA: Diagnosis not present

## 2019-08-18 DIAGNOSIS — Z20822 Contact with and (suspected) exposure to covid-19: Secondary | ICD-10-CM | POA: Insufficient documentation

## 2019-08-18 MED ORDER — HYDROCODONE-ACETAMINOPHEN 7.5-325 MG PO TABS: ORAL_TABLET | ORAL | 0 refills | Status: DC

## 2019-08-18 NOTE — Progress Notes (Signed)
Patient Andre Hunt, male DOB:[birth year], 59 y.o. [id number]  Chief Complaint  Patient presents with  . Shoulder Pain    R/ hurting some    HPI  Andre Hunt is a 59 y.o. male who has chronic right shoulder, elbow and hand pain.  He has no acute episodes.  He has had more pain with the cold weather.  He has no numbness.  He is taking his medicine.   Body mass index is 34.9 kg/m.  ROS  Review of Systems  HENT: Negative for congestion.   Respiratory: Negative for cough and shortness of breath.   Cardiovascular: Negative for chest pain and leg swelling.  Endocrine: Positive for cold intolerance.  Musculoskeletal: Positive for arthralgias and joint swelling.  Allergic/Immunologic: Positive for environmental allergies.  Neurological: Negative for numbness.  All other systems reviewed and are negative.   All other systems reviewed and are negative.  The following is a summary of the past history medically, past history surgically, known current medicines, social history and family history.  This information is gathered electronically by the computer from prior information and documentation.  I review this each visit and have found including this information at this point in the chart is beneficial and informative.    Past Medical History:  Diagnosis Date  . Allergy   . Arthritis   . Gout   . Hyperlipidemia   . Hypertension   . Personal history of colonic adenoma 09/28/2012    Past Surgical History:  Procedure Laterality Date  . APPENDECTOMY  1988  . CARPAL TUNNEL RELEASE     bilateral 2000 and 2001  . COLONOSCOPY  09/23/2012  . ELBOW SURGERY  2006   muscle repair  . EXPLORATORY LAPAROTOMY  1996   after falling on scissors; repair of "hole in intestines"  . [place]  . POLYPECTOMY    . UMBILICAL HERNIA REPAIR  1968    Family History  Problem Relation Age of Onset  . Kidney cancer Mother   . Diabetes Mother   . COPD Mother   .  Anuerysm Father   . Diabetes Father   . Heart disease Father   . Diabetes Sister   . Colon cancer Neg Hx   . Colon polyps Neg Hx   . Esophageal cancer Neg Hx   . Rectal cancer Neg Hx   . Stomach cancer Neg Hx     Social History Social History   Tobacco Use  . Smoking status: Never Smoker  . Smokeless tobacco: Current User    Types: Snuff  Substance Use Topics  . Alcohol use: No  . Drug use: No    Allergies  Allergen Reactions  . Iohexol Anaphylaxis    Allergy to IVP dye    Current Outpatient Medications  Medication Sig Dispense Refill  . allopurinol (ZYLOPRIM) 300 MG tablet Take 1 tablet (300 mg total) by mouth daily. 90 tablet 1  . atorvastatin (LIPITOR) 40 MG tablet Take 1 tablet (40 mg total) by mouth daily at 6 PM. 90 tablet 1  . cetirizine (ZYRTEC) 10 MG tablet TAKE 1 TABLET DAILY 90 tablet 3  . colchicine 0.6 MG tablet One tid po for five days. 15 tablet 5  . diazepam (VALIUM) 5 MG tablet TAKE (1) TABLET TWICE A DAY AS NEEDED FOR ARM SPASM 60 tablet 2  . HYDROcodone-acetaminophen (NORCO) 7.5-325 MG tablet TAKE 1 TABLET EVERY 4 HOURS AS NEEDED FOR MODERATE PAIN 110 tablet 0  . lisinopril (  ZESTRIL) 10 MG tablet Take 1 tablet (10 mg total) by mouth daily. 90 tablet 1  . naproxen (NAPROSYN) 500 MG tablet Take 1 tablet (500 mg total) by mouth 2 (two) times daily with a meal. 60 tablet 5  . promethazine (PHENERGAN) 12.5 MG tablet Take 1 tablet (12.5 mg total) by mouth every 8 (eight) hours as needed for nausea or vomiting. 20 tablet 0   No current facility-administered medications for this visit.     Physical Exam  Blood pressure (!) 152/64, pulse 64, height 5\' 8"  (1.727 m), weight 229 lb 8 oz (104.1 kg).  Constitutional: overall normal hygiene, normal nutrition, well developed, normal grooming, normal body habitus. Assistive device:none  Musculoskeletal: gait and station Limp none, muscle tone and strength are normal, no tremors or atrophy is present.  .   Neurological: coordination overall normal.  Deep tendon reflex/nerve stretch intact.  Sensation normal.  Cranial nerves II-XII intact.   Skin:   Normal overall no scars, lesions, ulcers or rashes. No psoriasis.  Psychiatric: Alert and oriented x 3.  Recent memory intact, remote memory unclear.  Normal mood and affect. Well groomed.  Good eye contact.  Cardiovascular: overall no swelling, no varicosities, no edema bilaterally, normal temperatures of the legs and arms, no clubbing, cyanosis and good capillary refill.  Lymphatic: palpation is normal.  Right shoulder has full motion but pain in the extremes.  NV intact.  All other systems reviewed and are negative   The patient has been educated about the nature of the problem(s) and counseled on treatment options.  The patient appeared to understand what I have discussed and is in agreement with it.  Encounter Diagnoses  Name Primary?  . Chronic right shoulder pain Yes  . Neck pain     PLAN Call if any problems.  Precautions discussed.  Continue current medications.   Return to clinic 3 months   I have reviewed the [facility] web site prior to prescribing narcotic medicine for this patient.   Electronically Signed Sanjuana Kava, MD 3/4/20219:03 AM

## 2019-08-19 LAB — SARS CORONAVIRUS 2 (TAT 6-24 HRS): SARS Coronavirus 2: NEGATIVE

## 2019-08-23 ENCOUNTER — Encounter: Payer: Self-pay | Admitting: Internal Medicine

## 2019-08-23 ENCOUNTER — Other Ambulatory Visit: Payer: Self-pay

## 2019-08-23 ENCOUNTER — Ambulatory Visit (AMBULATORY_SURGERY_CENTER): Payer: Medicare Other | Admitting: Internal Medicine

## 2019-08-23 VITALS — BP 108/53 | HR 54 | Temp 97.5°F | Resp 14 | Ht 68.0 in | Wt 225.0 lb

## 2019-08-23 DIAGNOSIS — Z86010 Personal history of colonic polyps: Secondary | ICD-10-CM | POA: Diagnosis not present

## 2019-08-23 MED ORDER — HYDROCORTISONE (PERIANAL) 2.5 % EX CREA: 1.0000 "application " | TOPICAL_CREAM | Freq: Two times a day (BID) | CUTANEOUS | 0 refills | Status: DC | PRN

## 2019-08-23 MED ORDER — SODIUM CHLORIDE 0.9 % IV SOLN: 500.0000 mL | Freq: Once | INTRAVENOUS | Status: DC

## 2019-08-23 NOTE — Progress Notes (Signed)
Temp by LC, vitals by DT   Pt's states no medical or surgical changes since previsit or office visit.

## 2019-08-23 NOTE — Patient Instructions (Addendum)
I did not see any polyps or cancer today!  It sounds like your hemorrhoids are bothering you some more - it may be necessary to band again.  They did not look swollen or inflamed too much today but colonoscopy not always the best test to look at these.  I will prescribe some cream to use and if that fails to relieve your problems you can come back and see me in the offiice to consider banding the hemorrhoids again.  Your next routine colonoscopy should be in 5 years - 2026.  I appreciate the opportunity to care for you. Gatha Mayer, MD, [facility]   Please read handouts provided. Continue present medications. Anusol HC 2.5% cream apply as needed twice daily.    YOU HAD AN ENDOSCOPIC PROCEDURE TODAY AT [facility]:   Refer to the procedure report that was given to you for any specific questions about what was found during the examination.  If the procedure report does not answer your questions, please call your gastroenterologist to clarify.  If you requested that your care partner not be given the details of your procedure findings, then the procedure report has been included in a sealed envelope for you to review at your convenience later.  YOU SHOULD EXPECT: Some feelings of bloating in the abdomen. Passage of more gas than usual.  Walking can help get rid of the air that was put into your GI tract during the procedure and reduce the bloating. If you had a lower endoscopy (such as a colonoscopy or flexible sigmoidoscopy) you may notice spotting of blood in your stool or on the toilet paper. If you underwent a bowel prep for your procedure, you may not have a normal bowel movement for a few days.  Please Note:  You might notice some irritation and congestion in your nose or some drainage.  This is from the oxygen used during your procedure.  There is no need for concern and it should clear up in a day or so.  SYMPTOMS TO REPORT IMMEDIATELY:   Following lower endoscopy  (colonoscopy or flexible sigmoidoscopy):  Excessive amounts of blood in the stool  Significant tenderness or worsening of abdominal pains  Swelling of the abdomen that is new, acute  Fever of 100F or higher    For urgent or emergent issues, a gastroenterologist can be reached at any hour by calling [phone]. Do not use MyChart messaging for urgent concerns.    DIET:  We do recommend a small meal at first, but then you may proceed to your regular diet.  Drink plenty of fluids but you should avoid alcoholic beverages for 24 hours.  ACTIVITY:  You should plan to take it easy for the rest of today and you should NOT DRIVE or use heavy machinery until tomorrow (because of the sedation medicines used during the test).    FOLLOW UP: Our staff will call the number listed on your records 48-72 hours following your procedure to check on you and address any questions or concerns that you may have regarding the information given to you following your procedure. If we do not reach you, we will leave a message.  We will attempt to reach you two times.  During this call, we will ask if you have developed any symptoms of COVID 19. If you develop any symptoms (ie: fever, flu-like symptoms, shortness of breath, cough etc.) before then, please call [phone].  If you test positive for Covid 19 in the  2 weeks post procedure, please call and report this information to [place].    If any biopsies were taken you will be contacted by phone or by letter within the next 1-3 weeks.  Please call us at [phone] if you have not heard about the biopsies in 3 weeks.    SIGNATURES/CONFIDENTIALITY: You and/or your care partner have signed paperwork which will be entered into your electronic medical record.  These signatures attest to the fact that that the information above on your After Visit Summary has been reviewed and is understood.  Full responsibility of the confidentiality of this discharge information lies  with you and/or your care-partner.

## 2019-08-23 NOTE — Progress Notes (Signed)
Report to PACU, RN, vss, BBS= Clear.  

## 2019-08-23 NOTE — Op Note (Signed)
[facility] Patient Name: Andre Hunt Procedure Date: 08/23/2019 1:39 PM MRN: [id number] Endoscopist: Gatha Mayer , MD Age: 59 Referring MD:  Date of Birth: 1960/09/16 Gender: Male Account #: [id number] Procedure:                Colonoscopy Indications:              Surveillance: Personal history of adenomatous                            polyps on last colonoscopy > 5 years ago Medicines:                Propofol per Anesthesia, Monitored Anesthesia Care Procedure:                Pre-Anesthesia Assessment:                           - Prior to the procedure, a History and Physical                            was performed, and patient medications and                            allergies were reviewed. The patient's tolerance of                            previous anesthesia was also reviewed. The risks                            and benefits of the procedure and the sedation                            options and risks were discussed with the patient.                            All questions were answered, and informed consent                            was obtained. Prior Anticoagulants: The patient has                            taken no previous anticoagulant or antiplatelet                            agents. ASA Grade Assessment: II - A patient with                            mild systemic disease. After reviewing the risks                            and benefits, the patient was deemed in                            satisfactory condition to undergo the procedure.  After obtaining informed consent, the colonoscope                            was passed under direct vision. Throughout the                            procedure, the patient's blood pressure, pulse, and                            oxygen saturations were monitored continuously. The                            Colonoscope was introduced through the anus and   advanced to the the cecum, identified by                            appendiceal orifice and ileocecal valve. The                            colonoscopy was performed without difficulty. The                            patient tolerated the procedure well. The quality                            of the bowel preparation was good. The ileocecal                            valve, appendiceal orifice, and rectum were                            photographed. The bowel preparation used was                            Miralax via split dose instruction. Scope In: 1:42:52 PM Scope Out: 1:54:31 PM Scope Withdrawal Time: 0 hours 9 minutes 55 seconds  Total Procedure Duration: 0 hours 11 minutes 39 seconds  Findings:                 The perianal and digital rectal examinations were                            normal. Pertinent negatives include normal prostate                            (size, shape, and consistency).                           The entire examined colon appeared normal on direct                            and retroflexion views. Complications:            No immediate complications. Estimated Blood Loss:     Estimated blood loss: none. Impression:               -  The entire examined colon is normal on direct and                            retroflexion views. Did have some hemorrhoid                            banding scars in rectum. Hemorrhoids not enlarged                            or inflamed but do sound symptomatic                           - No specimens collected.                           - Personal history of colonic polyps. Recommendation:           - Patient has a contact number available for                            emergencies. The signs and symptoms of potential                            delayed complications were discussed with the                            patient. Return to normal activities tomorrow.                            Written discharge instructions were  provided to the                            patient.                           - Resume previous diet.                           - Continue present medications.                           - Repeat colonoscopy in 5 years for surveillance.                           - Anusol HC 2.5% cream prn bid                           - If this fails to relieve anal ithcing/rectal                            bleeding return to clinic and consider repeat                            banding. Gatha Mayer, MD 08/23/2019 2:05:19 PM This report has been signed electronically.

## 2019-08-25 ENCOUNTER — Telehealth: Payer: Self-pay | Admitting: *Deleted

## 2019-08-25 ENCOUNTER — Telehealth: Payer: Self-pay

## 2019-08-25 NOTE — Telephone Encounter (Signed)
Covid-19 screening questions   Do you now or have you had a fever in the last 14 days? No. Do you have any respiratory symptoms of shortness of breath or cough now or in the last 14 days? No.  Do you have any family members or close contacts with diagnosed or suspected Covid-19 in the past 14 days? No. Have you been tested for Covid-19 and found to be positive? No.      Follow up Call-  Call back number 08/23/2019  Post procedure Call Back phone  # [phone]  Permission to leave phone message Yes  Some recent data might be hidden     Patient questions:  Do you have a fever, pain , or abdominal swelling? No. Pain Score  0 *  Have you tolerated food without any problems? Yes.    Have you been able to return to your normal activities? Yes.    Do you have any questions about your discharge instructions: Diet   No. Medications  No. Follow up visit  No.  Do you have questions or concerns about your Care? No.  Actions: * If pain score is 4 or above: No action needed, pain <4.

## 2019-08-25 NOTE — Telephone Encounter (Signed)
First attempt, left VM.  

## 2019-09-01 ENCOUNTER — Telehealth: Payer: Self-pay | Admitting: Nurse Practitioner

## 2019-09-01 NOTE — Chronic Care Management (AMB) (Signed)
  Chronic Care Management   Note  09/01/2019 Name: JAYMERE ALEN MRN: [id number] DOB: [birth year]  [name] is a 59 y.o. year old male who is a primary care patient of Chevis Pretty, FNP. I reached out to [name] by phone today in response to a referral sent by Mr. Antwine Agosto Can's health plan.     Mr. Trudo was given information about Chronic Care Management services today including:  1. CCM service includes personalized support from designated clinical staff supervised by his physician, including individualized plan of care and coordination with other care providers 2. 24/7 contact phone numbers for assistance for urgent and routine care needs. 3. Service will only be billed when office clinical staff spend 20 minutes or more in a month to coordinate care. 4. Only one practitioner may furnish and bill the service in a calendar month. 5. The patient may stop CCM services at any time (effective at the end of the month) by phone call to the office staff. 6. The patient will be responsible for cost sharing (co-pay) of up to 20% of the service fee (after annual deductible is met).  Patient did not agree to enrollment in care management services and does not wish to consider at this time.  Follow up plan: The patient has been provided with contact information for the care management team and has been advised to call with any health related questions or concerns.   [place], [place] [address] Direct Dial: [phone] [email or web address]: [email or web address]

## 2019-09-19 ENCOUNTER — Telehealth: Payer: Self-pay | Admitting: Orthopaedic Surgery

## 2019-09-19 NOTE — Telephone Encounter (Signed)
Patient called for refill: HYDROcodone-acetaminophen (NORCO) 7.5-325 MG tablet 110 tablet  [facility]

## 2019-09-20 MED ORDER — HYDROCODONE-ACETAMINOPHEN 7.5-325 MG PO TABS: ORAL_TABLET | ORAL | 0 refills | Status: DC

## 2019-10-20 ENCOUNTER — Telehealth: Payer: Self-pay | Admitting: Orthopedic Surgery

## 2019-10-20 MED ORDER — HYDROCODONE-ACETAMINOPHEN 7.5-325 MG PO TABS: ORAL_TABLET | ORAL | 0 refills | Status: DC

## 2019-10-20 NOTE — Telephone Encounter (Signed)
Patient requests refill: HYDROcodone-acetaminophen (NORCO) 7.5-325 MG tablet 110 tablet  -[facility]  

## 2019-11-07 ENCOUNTER — Other Ambulatory Visit: Payer: Self-pay | Admitting: Orthopaedic Surgery

## 2019-11-08 NOTE — Telephone Encounter (Signed)
Rx request 

## 2019-11-15 ENCOUNTER — Other Ambulatory Visit: Payer: Self-pay

## 2019-11-15 ENCOUNTER — Ambulatory Visit: Payer: Medicare Other | Admitting: Orthopaedic Surgery

## 2019-11-15 ENCOUNTER — Encounter: Payer: Self-pay | Admitting: Orthopaedic Surgery

## 2019-11-15 VITALS — Ht 68.0 in | Wt 225.0 lb

## 2019-11-15 DIAGNOSIS — G8929 Other chronic pain: Secondary | ICD-10-CM | POA: Diagnosis not present

## 2019-11-15 DIAGNOSIS — M25562 Pain in left knee: Secondary | ICD-10-CM | POA: Diagnosis not present

## 2019-11-15 DIAGNOSIS — M25511 Pain in right shoulder: Secondary | ICD-10-CM

## 2019-11-15 NOTE — Progress Notes (Signed)
Patient [id number] Andre Hunt, male DOB:Sep 03, 1960, 59 y.o. [id number]  Chief Complaint  Patient presents with  . Shoulder Pain    Rt Shoulder  . Knee Pain    Lt knee    HPI  Andre Hunt is a 59 y.o. male who has right shoulder pain and left knee pain.  He is taking his Naprosyn which helps.  He has no redness or paresthesia from the shoulder and no giving way of the left knee.  The left knee has some swelling and popping.  He has no trauma.   Body mass index is 34.21 kg/m.  ROS  Review of Systems  HENT: Negative for congestion.   Respiratory: Negative for cough and shortness of breath.   Cardiovascular: Negative for chest pain and leg swelling.  Endocrine: Positive for cold intolerance.  Musculoskeletal: Positive for arthralgias and joint swelling.  Allergic/Immunologic: Positive for environmental allergies.  Neurological: Negative for numbness.  All other systems reviewed and are negative.   All other systems reviewed and are negative.  The following is a summary of the past history medically, past history surgically, known current medicines, social history and family history.  This information is gathered electronically by the computer from prior information and documentation.  I review this each visit and have found including this information at this point in the chart is beneficial and informative.    Past Medical History:  Diagnosis Date  . Allergy   . Arthritis   . Gout   . Hyperlipidemia   . Hypertension   . Personal history of colonic adenoma 09/28/2012    Past Surgical History:  Procedure Laterality Date  . APPENDECTOMY  1988  . CARPAL TUNNEL RELEASE     bilateral 2000 and 2001  . COLONOSCOPY  09/23/2012  . ELBOW SURGERY  2006   muscle repair  . EXPLORATORY LAPAROTOMY  1996   after falling on scissors; repair of "hole in intestines"  . [place]  . POLYPECTOMY    . UMBILICAL HERNIA REPAIR  1968    Family History  Problem  Relation Age of Onset  . Kidney cancer Mother   . Diabetes Mother   . COPD Mother   . Anuerysm Father   . Diabetes Father   . Heart disease Father   . Diabetes Sister   . Colon cancer Neg Hx   . Colon polyps Neg Hx   . Esophageal cancer Neg Hx   . Rectal cancer Neg Hx   . Stomach cancer Neg Hx     Social History Social History   Tobacco Use  . Smoking status: Never Smoker  . Smokeless tobacco: Current User    Types: Snuff  Substance Use Topics  . Alcohol use: No  . Drug use: No    Allergies  Allergen Reactions  . Iohexol Anaphylaxis    Allergy to IVP dye    Current Outpatient Medications  Medication Sig Dispense Refill  . allopurinol (ZYLOPRIM) 300 MG tablet Take 1 tablet (300 mg total) by mouth daily. 90 tablet 1  . atorvastatin (LIPITOR) 40 MG tablet Take 1 tablet (40 mg total) by mouth daily at 6 PM. 90 tablet 1  . cetirizine (ZYRTEC) 10 MG tablet TAKE 1 TABLET DAILY 90 tablet 3  . colchicine 0.6 MG tablet One tid po for five days. 15 tablet 5  . diazepam (VALIUM) 5 MG tablet TAKE (1) TABLET TWICE A DAY AS NEEDED FOR ARM SPASM 60 tablet 2  . HYDROcodone-acetaminophen (  NORCO) 7.5-325 MG tablet TAKE 1 TABLET EVERY 4 HOURS AS NEEDED FOR MODERATE PAIN 110 tablet 0  . hydrocortisone (ANUSOL-HC) 2.5 % rectal cream Place 1 application rectally 2 (two) times daily as needed for hemorrhoids or anal itching. 30 g 0  . lisinopril (ZESTRIL) 10 MG tablet Take 1 tablet (10 mg total) by mouth daily. 90 tablet 1  . naproxen (NAPROSYN) 500 MG tablet TAKE 1 TABLET ONCE DAILY WITH MEAL 60 tablet 5  . promethazine (PHENERGAN) 12.5 MG tablet Take 1 tablet (12.5 mg total) by mouth every 8 (eight) hours as needed for nausea or vomiting. 20 tablet 0   No current facility-administered medications for this visit.     Physical Exam  Height 5\' 8"  (1.727 m), weight 225 lb (102.1 kg).  Constitutional: overall normal hygiene, normal nutrition, well developed, normal grooming, normal body  habitus. Assistive device:none  Musculoskeletal: gait and station Limp left, muscle tone and strength are normal, no tremors or atrophy is present.  .  Neurological: coordination overall normal.  Deep tendon reflex/nerve stretch intact.  Sensation normal.  Cranial nerves II-XII intact.   Skin:   Normal overall no scars, lesions, ulcers or rashes. No psoriasis.  Psychiatric: Alert and oriented x 3.  Recent memory intact, remote memory unclear.  Normal mood and affect. Well groomed.  Good eye contact.  Cardiovascular: overall no swelling, no varicosities, no edema bilaterally, normal temperatures of the legs and arms, no clubbing, cyanosis and good capillary refill.  Lymphatic: palpation is normal.  Left knee has slight effusion, crepitus, ROM 0 to 110, slight limp, stable.  Right shoulder with full motion but pain in the extremes.  NV intact.  All other systems reviewed and are negative   The patient has been educated about the nature of the problem(s) and counseled on treatment options.  The patient appeared to understand what I have discussed and is in agreement with it.  Encounter Diagnoses  Name Primary?  . Chronic right shoulder pain Yes  . Chronic pain of left knee     PLAN Call if any problems.  Precautions discussed.  Continue current medications.   Return to clinic 3 months   Electronically Villalba, MD 6/1/20211:47 PM

## 2019-11-23 ENCOUNTER — Telehealth: Payer: Self-pay | Admitting: Orthopaedic Surgery

## 2019-11-23 NOTE — Telephone Encounter (Signed)
Patient requests refill on Hydrocodone/Acetaminophen 7.5-325  Mgs.   Qty  110 °  °Sig: TAKE 1 TABLET EVERY 4 HOURS AS NEEDED FOR MODERATE PAIN °  °Patient states he uses [facility] °

## 2019-11-24 MED ORDER — HYDROCODONE-ACETAMINOPHEN 7.5-325 MG PO TABS: ORAL_TABLET | ORAL | 0 refills | Status: DC

## 2020-01-03 ENCOUNTER — Telehealth: Payer: Self-pay | Admitting: Orthopaedic Surgery

## 2020-01-03 MED ORDER — HYDROCODONE-ACETAMINOPHEN 7.5-325 MG PO TABS: ORAL_TABLET | ORAL | 0 refills | Status: DC

## 2020-01-03 NOTE — Telephone Encounter (Signed)
Patient requests refill: HYDROcodone-acetaminophen (NORCO) 7.5-325 MG tablet 110 tablet  [facility]

## 2020-02-02 ENCOUNTER — Telehealth: Payer: Self-pay | Admitting: Orthopaedic Surgery

## 2020-02-02 MED ORDER — HYDROCODONE-ACETAMINOPHEN 7.5-325 MG PO TABS: ORAL_TABLET | ORAL | 0 refills | Status: DC

## 2020-02-02 NOTE — Telephone Encounter (Signed)
Patient called for refill  HYDROcodone-acetaminophen (NORCO) 7.5-325 MG tablet 110 tablet  -[facility]    (note: patient said he has been paying out of pocket - advised to contact his prescription insurer)

## 2020-02-14 ENCOUNTER — Ambulatory Visit: Payer: Medicare Other | Admitting: Orthopaedic Surgery

## 2020-02-21 ENCOUNTER — Encounter: Payer: Self-pay | Admitting: Orthopaedic Surgery

## 2020-02-21 ENCOUNTER — Ambulatory Visit: Payer: Medicare Other | Admitting: Orthopaedic Surgery

## 2020-02-21 ENCOUNTER — Other Ambulatory Visit: Payer: Self-pay

## 2020-02-21 VITALS — BP 116/75 | HR 73 | Ht 68.0 in | Wt 221.0 lb

## 2020-02-21 DIAGNOSIS — M25511 Pain in right shoulder: Secondary | ICD-10-CM

## 2020-02-21 DIAGNOSIS — M25562 Pain in left knee: Secondary | ICD-10-CM

## 2020-02-21 DIAGNOSIS — G8929 Other chronic pain: Secondary | ICD-10-CM

## 2020-02-21 NOTE — Progress Notes (Signed)
Patient Andre Hunt, male DOB:1960-09-20, 59 y.o. KGU:[id number]  Chief Complaint  Patient presents with  . Shoulder Pain    Rt shoulder     HPI  Andre Hunt is a 59 y.o. male who has chronic right shoulder and left knee pain.  He has done well.  The shoulder is more painful.  His wife is sick again and he has to lift and take care of her.  His left knee is better.  He has no new trauma.  He is doing his exercises and taking his medicine.   Body mass index is 33.6 kg/m.  ROS  Review of Systems  HENT: Negative for congestion.   Respiratory: Negative for cough and shortness of breath.   Cardiovascular: Negative for chest pain and leg swelling.  Endocrine: Positive for cold intolerance.  Musculoskeletal: Positive for arthralgias and joint swelling.  Allergic/Immunologic: Positive for environmental allergies.  Neurological: Negative for numbness.  All other systems reviewed and are negative.   All other systems reviewed and are negative.  The following is a summary of the past history medically, past history surgically, known current medicines, social history and family history.  This information is gathered electronically by the computer from prior information and documentation.  I review this each visit and have found including this information at this point in the chart is beneficial and informative.    Past Medical History:  Diagnosis Date  . Allergy   . Arthritis   . Gout   . Hyperlipidemia   . Hypertension   . Personal history of colonic adenoma 09/28/2012    Past Surgical History:  Procedure Laterality Date  . APPENDECTOMY  1988  . CARPAL TUNNEL RELEASE     bilateral 2000 and 2001  . COLONOSCOPY  09/23/2012  . ELBOW SURGERY  2006   muscle repair  . EXPLORATORY LAPAROTOMY  1996   after falling on scissors; repair of "hole in intestines"  . [place]  . POLYPECTOMY    . UMBILICAL HERNIA REPAIR  1968    Family History  Problem  Relation Age of Onset  . Kidney cancer Mother   . Diabetes Mother   . COPD Mother   . Anuerysm Father   . Diabetes Father   . Heart disease Father   . Diabetes Sister   . Colon cancer Neg Hx   . Colon polyps Neg Hx   . Esophageal cancer Neg Hx   . Rectal cancer Neg Hx   . Stomach cancer Neg Hx     Social History Social History   Tobacco Use  . Smoking status: Never Smoker  . Smokeless tobacco: Current User    Types: Snuff  Vaping Use  . Vaping Use: Never used  Substance Use Topics  . Alcohol use: No  . Drug use: No    Allergies  Allergen Reactions  . Iohexol Anaphylaxis    Allergy to IVP dye    Current Outpatient Medications  Medication Sig Dispense Refill  . allopurinol (ZYLOPRIM) 300 MG tablet Take 1 tablet (300 mg total) by mouth daily. 90 tablet 1  . atorvastatin (LIPITOR) 40 MG tablet Take 1 tablet (40 mg total) by mouth daily at 6 PM. 90 tablet 1  . cetirizine (ZYRTEC) 10 MG tablet TAKE 1 TABLET DAILY 90 tablet 3  . colchicine 0.6 MG tablet One tid po for five days. 15 tablet 5  . diazepam (VALIUM) 5 MG tablet TAKE (1) TABLET TWICE A DAY AS  NEEDED FOR ARM SPASM 60 tablet 2  . HYDROcodone-acetaminophen (NORCO) 7.5-325 MG tablet TAKE 1 TABLET EVERY 4 HOURS AS NEEDED FOR MODERATE PAIN 110 tablet 0  . hydrocortisone (ANUSOL-HC) 2.5 % rectal cream Place 1 application rectally 2 (two) times daily as needed for hemorrhoids or anal itching. 30 g 0  . lisinopril (ZESTRIL) 10 MG tablet Take 1 tablet (10 mg total) by mouth daily. 90 tablet 1  . naproxen (NAPROSYN) 500 MG tablet TAKE 1 TABLET ONCE DAILY WITH MEAL 60 tablet 5  . promethazine (PHENERGAN) 12.5 MG tablet Take 1 tablet (12.5 mg total) by mouth every 8 (eight) hours as needed for nausea or vomiting. 20 tablet 0   No current facility-administered medications for this visit.     Physical Exam  Blood pressure 116/75, pulse 73, height 5\' 8"  (1.727 m), weight 221 lb (100.2 kg).  Constitutional: overall normal  hygiene, normal nutrition, well developed, normal grooming, normal body habitus. Assistive device:none  Musculoskeletal: gait and station Limp none, muscle tone and strength are normal, no tremors or atrophy is present.  .  Neurological: coordination overall normal.  Deep tendon reflex/nerve stretch intact.  Sensation normal.  Cranial nerves II-XII intact.   Skin:   Normal overall no scars, lesions, ulcers or rashes. No psoriasis.  Psychiatric: Alert and oriented x 3.  Recent memory intact, remote memory unclear.  Normal mood and affect. Well groomed.  Good eye contact.  Cardiovascular: overall no swelling, no varicosities, no edema bilaterally, normal temperatures of the legs and arms, no clubbing, cyanosis and good capillary refill.  Lymphatic: palpation is normal.  Right shoulder has good ROM with pain in the extremes.  Left knee has good ROM with some crepitus and slight effusion.  All other systems reviewed and are negative   The patient has been educated about the nature of the problem(s) and counseled on treatment options.  The patient appeared to understand what I have discussed and is in agreement with it.  Encounter Diagnoses  Name Primary?  . Chronic right shoulder pain Yes  . Chronic pain of left knee     PLAN Call if any problems.  Precautions discussed.  Continue current medications.   Return to clinic 3 months   Electronically Signed Sanjuana Kava, MD 9/7/20211:42 PM

## 2020-03-05 ENCOUNTER — Other Ambulatory Visit: Payer: Self-pay | Admitting: Nurse Practitioner

## 2020-03-05 ENCOUNTER — Telehealth: Payer: Self-pay | Admitting: Orthopaedic Surgery

## 2020-03-05 DIAGNOSIS — F411 Generalized anxiety disorder: Secondary | ICD-10-CM

## 2020-03-05 MED ORDER — HYDROCODONE-ACETAMINOPHEN 7.5-325 MG PO TABS: ORAL_TABLET | ORAL | 0 refills | Status: DC

## 2020-03-05 NOTE — Telephone Encounter (Signed)
Patient requests refill on Hydrocodone/Acetaminophen 7.5-325  Mgs.   Qty  110  Sig: TAKE 1 TABLET EVERY 4 HOURS AS NEEDED FOR MODERATE PAIN  Patient states he uses [employer]

## 2020-03-13 ENCOUNTER — Encounter: Payer: Self-pay | Admitting: Nurse Practitioner

## 2020-03-13 ENCOUNTER — Ambulatory Visit (INDEPENDENT_AMBULATORY_CARE_PROVIDER_SITE_OTHER): Payer: Medicare Other | Admitting: Nurse Practitioner

## 2020-03-13 ENCOUNTER — Other Ambulatory Visit: Payer: Self-pay

## 2020-03-13 VITALS — BP 120/77 | HR 58 | Temp 97.5°F | Resp 20 | Ht 68.0 in | Wt 224.0 lb

## 2020-03-13 DIAGNOSIS — M1A00X0 Idiopathic chronic gout, unspecified site, without tophus (tophi): Secondary | ICD-10-CM

## 2020-03-13 DIAGNOSIS — F411 Generalized anxiety disorder: Secondary | ICD-10-CM

## 2020-03-13 DIAGNOSIS — I10 Essential (primary) hypertension: Secondary | ICD-10-CM

## 2020-03-13 DIAGNOSIS — Z6834 Body mass index (BMI) 34.0-34.9, adult: Secondary | ICD-10-CM

## 2020-03-13 DIAGNOSIS — E785 Hyperlipidemia, unspecified: Secondary | ICD-10-CM

## 2020-03-13 MED ORDER — ALLOPURINOL 300 MG PO TABS: 300.0000 mg | ORAL_TABLET | Freq: Every day | ORAL | 1 refills | Status: DC

## 2020-03-13 MED ORDER — ATORVASTATIN CALCIUM 40 MG PO TABS: 40.0000 mg | ORAL_TABLET | Freq: Every day | ORAL | 1 refills | Status: DC

## 2020-03-13 MED ORDER — DIAZEPAM 5 MG PO TABS: ORAL_TABLET | ORAL | 2 refills | Status: DC

## 2020-03-13 MED ORDER — LISINOPRIL 10 MG PO TABS: 10.0000 mg | ORAL_TABLET | Freq: Every day | ORAL | 1 refills | Status: DC

## 2020-03-13 NOTE — Patient Instructions (Signed)
Stress, Adult Stress is a normal reaction to life events. Stress is what you feel when life demands more than you are used to, or more than you think you can handle. Some stress can be useful, such as studying for a test or meeting a deadline at work. Stress that occurs too often or for too long can cause problems. It can affect your emotional health and interfere with relationships and normal daily activities. Too much stress can weaken your body's defense system (immune system) and increase your risk for physical illness. If you already have a medical problem, stress can make it worse. What are the causes? All sorts of life events can cause stress. An event that causes stress for one person may not be stressful for another person. Major life events, whether positive or negative, commonly cause stress. Examples include:  Losing a job or starting a new job.  Losing a loved one.  Moving to a new town or home.  Getting married or divorced.  Having a baby.  Getting injured or sick. Less obvious life events can also cause stress, especially if they occur day after day or in combination with each other. Examples include:  Working long hours.  Driving in traffic.  Caring for children.  Being in debt.  Being in a difficult relationship. What are the signs or symptoms? Stress can cause emotional symptoms, including:  Anxiety. This is feeling worried, afraid, on edge, overwhelmed, or out of control.  Anger, including irritation or impatience.  Depression. This is feeling sad, down, helpless, or guilty.  Trouble focusing, remembering, or making decisions. Stress can cause physical symptoms, including:  Aches and pains. These may affect your head, neck, back, stomach, or other areas of your body.  Tight muscles or a clenched jaw.  Low energy.  Trouble sleeping. Stress can cause unhealthy behaviors, including:  Eating to feel better (overeating) or skipping meals.  Working too  much or putting off tasks.  Smoking, drinking alcohol, or using drugs to feel better. How is this diagnosed? Stress is diagnosed through an assessment by your health care provider. He or she may diagnose this condition based on:  Your symptoms and any stressful life events.  Your medical history.  Tests to rule out other causes of your symptoms. Depending on your condition, your health care provider may refer you to a specialist for further evaluation. How is this treated?  Stress management techniques are the recommended treatment for stress. Medicine is not typically recommended for the treatment of stress. Techniques to reduce your reaction to stressful life events include:  Stress identification. Monitor yourself for symptoms of stress and identify what causes stress for you. These skills may help you to avoid or prepare for stressful events.  Time management. Set your priorities, keep a calendar of events, and learn to say no. Taking these actions can help you avoid making too many commitments. Techniques for coping with stress include:  Rethinking the problem. Try to think realistically about stressful events rather than ignoring them or overreacting. Try to find the positives in a stressful situation rather than focusing on the negatives.  Exercise. Physical exercise can release both physical and emotional tension. The key is to find a form of exercise that you enjoy and do it regularly.  Relaxation techniques. These relax the body and mind. The key is to find one or more that you enjoy and use the techniques regularly. Examples include: ? Meditation, deep breathing, or progressive relaxation techniques. ? Yoga or   tai chi. ? Biofeedback, mindfulness techniques, or journaling. ? Listening to music, being out in nature, or participating in other hobbies.  Practicing a healthy lifestyle. Eat a balanced diet, drink plenty of water, limit or avoid caffeine, and get plenty of  sleep.  Having a strong support network. Spend time with family, friends, or other people you enjoy being around. Express your feelings and talk things over with someone you trust. Counseling or talk therapy with a mental health professional may be helpful if you are having trouble managing stress on your own. Follow these instructions at home: Lifestyle   Avoid drugs.  Do not use any products that contain nicotine or tobacco, such as cigarettes, e-cigarettes, and chewing tobacco. If you need help quitting, ask your health care provider.  Limit alcohol intake to no more than 1 drink a day for nonpregnant women and 2 drinks a day for men. One drink equals 12 oz of beer, 5 oz of wine, or 1 oz of hard liquor  Do not use alcohol or drugs to relax.  Eat a balanced diet that includes fresh fruits and vegetables, whole grains, lean meats, fish, eggs, and beans, and low-fat dairy. Avoid processed foods and foods high in added fat, sugar, and salt.  Exercise at least 30 minutes on 5 or more days each week.  Get 7-8 hours of sleep each night. General instructions   Practice stress management techniques as discussed with your health care provider.  Drink enough fluid to keep your urine clear or pale yellow.  Take over-the-counter and prescription medicines only as told by your health care provider.  Keep all follow-up visits as told by your health care provider. This is important. Contact a health care provider if:  Your symptoms get worse.  You have new symptoms.  You feel overwhelmed by your problems and can no longer manage them on your own. Get help right away if:  You have thoughts of hurting yourself or others. If you ever feel like you may hurt yourself or others, or have thoughts about taking your own life, get help right away. You can go to your nearest emergency department or call:  Your local emergency services (911 in the [place]  A suicide crisis helpline, such as the  [place] at (316) 250-6172. This is open 24 hours a day. Summary  Stress is a normal reaction to life events. It can cause problems if it happens too often or for too long.  Practicing stress management techniques is the best way to treat stress.  Counseling or talk therapy with a mental health professional may be helpful if you are having trouble managing stress on your own. This information is not intended to replace advice given to you by your health care provider. Make sure you discuss any questions you have with your health care provider. Document Revised: 12/31/2018 Document Reviewed: 07/23/2016 Elsevier Patient Education  [place].

## 2020-03-13 NOTE — Progress Notes (Signed)
Subjective:    Patient ID: Andre Hunt, male    DOB: 12/21/1960, 59 y.o.   MRN: [id number]   Chief Complaint: Hypertension    HPI:  1. Essential hypertension BP Readings from Last 3 Encounters:  03/13/20 120/77  02/21/20 116/75  08/23/19 (!) 108/53  Checks BP sometimes at home, takes medication as prescribed. Denies CP, SOB, or headaches.    2. Hyperlipidemia with target LDL less than 100 Lab Results  Component Value Date   CHOL 110 04/18/2019   HDL 41 04/18/2019   LDLCALC 52 04/18/2019   TRIG 86 04/18/2019   CHOLHDL 2.7 04/18/2019  Avoids foods that are high in fat or fried. Takes medication as prescribed.    3. 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    4. GAD (generalized anxiety disorder) GAD 7 : Generalized Anxiety Score 03/13/2020 07/21/2019 04/18/2019  Nervous, Anxious, on Edge 3 1 3   Control/stop worrying 3 2 0  Worry too much - different things 3 2 0  Trouble relaxing 3 2 3   Restless 0 2 3  Easily annoyed or irritable 1 2 3   Afraid - awful might happen 1 0 3  Total GAD 7 Score 14 11 15   Anxiety Difficulty Not difficult at all Somewhat difficult -  Wife sick, increased stress.       Outpatient Encounter Medications as of 03/13/2020  Medication Sig  . allopurinol (ZYLOPRIM) 300 MG tablet Take 1 tablet (300 mg total) by mouth daily.  Marland Kitchen atorvastatin (LIPITOR) 40 MG tablet Take 1 tablet (40 mg total) by mouth daily at 6 PM.  . cetirizine (ZYRTEC) 10 MG tablet TAKE 1 TABLET DAILY  . colchicine 0.6 MG tablet One tid po for five days.  . diazepam (VALIUM) 5 MG tablet TAKE (1) TABLET TWICE A DAY AS NEEDED FOR ARM SPASM  . HYDROcodone-acetaminophen (NORCO) 7.5-325 MG tablet TAKE 1 TABLET EVERY 4 HOURS AS  NEEDED FOR MODERATE PAIN  . hydrocortisone (ANUSOL-HC) 2.5 % rectal cream Place 1 application rectally 2 (two) times daily as needed for hemorrhoids or anal itching.  Marland Kitchen lisinopril (ZESTRIL) 10 MG tablet Take 1 tablet (10 mg total) by mouth daily.  . naproxen (NAPROSYN) 500 MG tablet TAKE 1 TABLET ONCE DAILY WITH MEAL  . promethazine (PHENERGAN) 12.5 MG tablet Take 1 tablet (12.5 mg total) by mouth every 8 (eight) hours as needed for nausea or vomiting.   No facility-administered encounter medications on file as of 03/13/2020.    Past Surgical History:  Procedure Laterality Date  . APPENDECTOMY  1988  . CARPAL TUNNEL RELEASE     bilateral 2000 and 2001  . COLONOSCOPY  09/23/2012  . ELBOW SURGERY  2006   muscle repair  . EXPLORATORY LAPAROTOMY  1996   after falling on scissors; repair of "hole in intestines"  . [place]  . POLYPECTOMY    . UMBILICAL HERNIA REPAIR  1968    Family History  Problem Relation Age of Onset  . Kidney cancer Mother   . Diabetes Mother   . COPD Mother   . Anuerysm Father   . Diabetes Father   . Heart disease Father   . Diabetes Sister   .  Colon cancer Neg Hx   . Colon polyps Neg Hx   . Esophageal cancer Neg Hx   . Rectal cancer Neg Hx   . Stomach cancer Neg Hx     New complaints: No new complaints.   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   Controlled substance contract: signed 04/18/2019    Review of Systems  Constitutional: Negative.   HENT: Negative.   Eyes: Negative.   Respiratory: Negative.   Cardiovascular: Negative.   Gastrointestinal: Negative.   Endocrine: Negative.   Genitourinary: Negative.   Musculoskeletal: Negative.   Skin: Negative.   Allergic/Immunologic: Negative.   Neurological: Negative.   Hematological: Negative.   Psychiatric/Behavioral: Negative.   All other systems reviewed and are negative.       Objective:   Physical Exam Vitals and nursing note  reviewed.  Constitutional:      Appearance: Normal appearance.  HENT:     Head: Normocephalic and atraumatic.     Right Ear: Tympanic membrane, ear canal and external ear normal.     Left Ear: Tympanic membrane, ear canal and external ear normal.     Nose: Nose normal.     Mouth/Throat:     Mouth: Mucous membranes are moist.     Pharynx: Oropharynx is clear.  Eyes:     Extraocular Movements: Extraocular movements intact.     Conjunctiva/sclera: Conjunctivae normal.     Pupils: Pupils are equal, round, and reactive to light.  Cardiovascular:     Rate and Rhythm: Normal rate and regular rhythm.     Pulses: Normal pulses.     Heart sounds: Normal heart sounds.  Pulmonary:     Effort: Pulmonary effort is normal.     Breath sounds: Normal breath sounds.  Abdominal:     General: Bowel sounds are normal.  Musculoskeletal:        General: Normal range of motion.     Cervical back: Normal range of motion.  Skin:    General: Skin is warm and dry.     Capillary Refill: Capillary refill takes less than 2 seconds.  Neurological:     General: No focal deficit present.     Mental Status: He is alert and oriented to person, place, and time. Mental status is at baseline.  Psychiatric:        Mood and Affect: Mood normal.        Behavior: Behavior normal.        Thought Content: Thought content normal.        Judgment: Judgment normal.    BP 120/77   Pulse (!) 58   Temp (!) 97.5 F (36.4 C) (Temporal)   Resp 20   Ht 5\' 8"  (1.727 m)   Wt 224 lb (101.6 kg)   SpO2 95%   BMI 34.06 kg/m        Assessment & Plan:  ENMANUEL ZUFALL comes in today with chief complaint of Hypertension   Diagnosis and orders addressed:  1. Essential hypertension Eat a heart healthy diet and avoid foods high in salt. Take blood pressure regularly and take medication as prescribed.   2. Hyperlipidemia with target LDL less than 100 Avoid foods that are high in fat and fried. Take medication as  prescribed.   3. BMI 34.0-34.9,adult Exercise regularly, walking is a great cardiovascular exercise that can help lower blood pressure and cholesterol levels.   4. GAD (generalized anxiety disorder) Practice stress management and  take medication as prescribed. Report any new or worsening symptoms.    Labs pending Health Maintenance reviewed Diet and exercise encouraged  Follow up plan: Follow up in 6 months.    Mary-Margaret Hassell Done, FNP

## 2020-04-03 ENCOUNTER — Encounter: Payer: Self-pay | Admitting: Orthopaedic Surgery

## 2020-04-03 ENCOUNTER — Other Ambulatory Visit: Payer: Self-pay

## 2020-04-03 ENCOUNTER — Ambulatory Visit: Payer: Medicare Other | Admitting: Orthopaedic Surgery

## 2020-04-03 DIAGNOSIS — M25562 Pain in left knee: Secondary | ICD-10-CM

## 2020-04-03 DIAGNOSIS — G8929 Other chronic pain: Secondary | ICD-10-CM

## 2020-04-03 NOTE — Progress Notes (Signed)
Patient Andre Hunt:GGEZM ABHIMANYU CRUCES, male DOB:11-Dec-1960, 59 y.o. OQH:[id number]  Chief Complaint  Patient presents with   Knee Pain    left     HPI  Andre Hunt is a 59 y.o. male who has increasing pain to the left knee.  He has popping and swelling and giving way.  He has no trauma, no redness, no numbness.  The giving way has just started over the last few days.  He is more painful laterally.     There is no height or weight on file to calculate BMI.  ROS  Review of Systems  HENT: Negative for congestion.   Respiratory: Negative for cough and shortness of breath.   Cardiovascular: Negative for chest pain and leg swelling.  Endocrine: Positive for cold intolerance.  Musculoskeletal: Positive for arthralgias and joint swelling.  Allergic/Immunologic: Positive for environmental allergies.  Neurological: Negative for numbness.  All other systems reviewed and are negative.   All other systems reviewed and are negative.  The following is a summary of the past history medically, past history surgically, known current medicines, social history and family history.  This information is gathered electronically by the computer from prior information and documentation.  I review this each visit and have found including this information at this point in the chart is beneficial and informative.    Past Medical History:  Diagnosis Date   Allergy    Arthritis    Gout    Hyperlipidemia    Hypertension    Personal history of colonic adenoma 09/28/2012    Past Surgical History:  Procedure Laterality Date   APPENDECTOMY  1988   CARPAL TUNNEL RELEASE     bilateral 2000 and 2001   COLONOSCOPY  09/23/2012   ELBOW SURGERY  2006   muscle repair   EXPLORATORY LAPAROTOMY  1996   after falling on scissors; repair of "hole in intestines"   [place]    Family History  Problem Relation Age of Onset   Kidney  cancer Mother    Diabetes Mother    COPD Mother    Anuerysm Father    Diabetes Father    Heart disease Father    Diabetes Sister    Colon cancer Neg Hx    Colon polyps Neg Hx    Esophageal cancer Neg Hx    Rectal cancer Neg Hx    Stomach cancer Neg Hx     Social History Social History   Tobacco Use   Smoking status: Never Smoker   Smokeless tobacco: Current User    Types: Snuff  Vaping Use   Vaping Use: Never used  Substance Use Topics   Alcohol use: No   Drug use: No    Allergies  Allergen Reactions   Iohexol Anaphylaxis    Allergy to IVP dye    Current Outpatient Medications  Medication Sig Dispense Refill   allopurinol (ZYLOPRIM) 300 MG tablet Take 1 tablet (300 mg total) by mouth daily. 90 tablet 1   atorvastatin (LIPITOR) 40 MG tablet Take 1 tablet (40 mg total) by mouth daily at 6 PM. 90 tablet 1   cetirizine (ZYRTEC) 10 MG tablet TAKE 1 TABLET DAILY 90 tablet 3   colchicine 0.6 MG tablet One tid po for five days. 15 tablet 5   diazepam (VALIUM) 5 MG tablet TAKE (1) TABLET TWICE A DAY AS NEEDED FOR ARM SPASM 60 tablet 2  HYDROcodone-acetaminophen (NORCO) 7.5-325 MG tablet TAKE 1 TABLET EVERY 4 HOURS AS NEEDED FOR MODERATE PAIN 110 tablet 0   hydrocortisone (ANUSOL-HC) 2.5 % rectal cream Place 1 application rectally 2 (two) times daily as needed for hemorrhoids or anal itching. 30 g 0   lisinopril (ZESTRIL) 10 MG tablet Take 1 tablet (10 mg total) by mouth daily. 90 tablet 1   naproxen (NAPROSYN) 500 MG tablet TAKE 1 TABLET ONCE DAILY WITH MEAL 60 tablet 5   promethazine (PHENERGAN) 12.5 MG tablet Take 1 tablet (12.5 mg total) by mouth every 8 (eight) hours as needed for nausea or vomiting. 20 tablet 0   No current facility-administered medications for this visit.     Physical Exam  There were no vitals taken for this visit.  Constitutional: overall normal hygiene, normal nutrition, well developed, normal grooming, normal body  habitus. Assistive device:none  Musculoskeletal: gait and station Limp left, muscle tone and strength are normal, no tremors or atrophy is present.  .  Neurological: coordination overall normal.  Deep tendon reflex/nerve stretch intact.  Sensation normal.  Cranial nerves II-XII intact.   Skin:   Normal overall no scars, lesions, ulcers or rashes. No psoriasis.  Psychiatric: Alert and oriented x 3.  Recent memory intact, remote memory unclear.  Normal mood and affect. Well groomed.  Good eye contact.  Cardiovascular: overall no swelling, no varicosities, no edema bilaterally, normal temperatures of the legs and arms, no clubbing, cyanosis and good capillary refill.  Lymphatic: palpation is normal.  Left knee tender, effusion, crepitus, ROM 0 to 105, positive lateral [place], lateral joint line pain, NV intact.  Limp left.  All other systems reviewed and are negative   The patient has been educated about the nature of the problem(s) and counseled on treatment options.  The patient appeared to understand what I have discussed and is in agreement with it.  Encounter Diagnosis  Name Primary?   Chronic pain of left knee Yes   PROCEDURE NOTE:  The patient requests injections of the left knee , verbal consent was obtained.  The left knee was prepped appropriately after time out was performed.   Sterile technique was observed and injection of 1 cc of Depo-Medrol 40 mg with several cc's of plain xylocaine. Anesthesia was provided by ethyl chloride and a 20-gauge needle was used to inject the knee area. The injection was tolerated well.  A band aid dressing was applied.  The patient was advised to apply ice later today and tomorrow to the injection sight as needed.     PLAN Call if any problems.  Precautions discussed.  Continue current medications.   Return to clinic 3 weeks   Get MRI of the left knee.  I am concerned about meniscus tear.  Electronically Signed Sanjuana Kava,  MD 10/19/202110:56 AM

## 2020-04-04 ENCOUNTER — Telehealth: Payer: Self-pay | Admitting: Orthopaedic Surgery

## 2020-04-04 MED ORDER — HYDROCODONE-ACETAMINOPHEN 7.5-325 MG PO TABS: ORAL_TABLET | ORAL | 0 refills | Status: DC

## 2020-04-04 NOTE — Telephone Encounter (Signed)
Patient called for refill (states discussed at appointment with Dr Luna Glasgow yesterday, and did not realize it is due:  HYDROcodone-acetaminophen ([place]) 7.5-325 MG tablet 110 tablet  [facility]

## 2020-04-11 ENCOUNTER — Ambulatory Visit (HOSPITAL_COMMUNITY)
Admission: RE | Admit: 2020-04-11 | Discharge: 2020-04-11 | Disposition: A | Discharge status: Home / Self Care | Payer: Medicare Other | Source: Ambulatory Visit | Attending: Orthopaedic Surgery | Admitting: Orthopaedic Surgery

## 2020-04-11 ENCOUNTER — Other Ambulatory Visit: Payer: Self-pay

## 2020-04-11 DIAGNOSIS — G8929 Other chronic pain: Secondary | ICD-10-CM

## 2020-04-11 DIAGNOSIS — M25562 Pain in left knee: Secondary | ICD-10-CM | POA: Diagnosis not present

## 2020-04-24 ENCOUNTER — Ambulatory Visit (INDEPENDENT_AMBULATORY_CARE_PROVIDER_SITE_OTHER): Payer: Medicare Other | Admitting: Orthopaedic Surgery

## 2020-04-24 ENCOUNTER — Encounter: Payer: Self-pay | Admitting: Orthopaedic Surgery

## 2020-04-24 ENCOUNTER — Other Ambulatory Visit: Payer: Self-pay

## 2020-04-24 VITALS — BP 118/74 | HR 61 | Ht 68.0 in | Wt 224.0 lb

## 2020-04-24 DIAGNOSIS — G8929 Other chronic pain: Secondary | ICD-10-CM | POA: Diagnosis not present

## 2020-04-24 DIAGNOSIS — M25562 Pain in left knee: Secondary | ICD-10-CM | POA: Diagnosis not present

## 2020-04-24 NOTE — Progress Notes (Signed)
Patient Andre Hunt, male DOB:[birth year], 59 y.o. [id number]  Chief Complaint  Patient presents with   Knee Pain    Lt Knee    HPI  Andre Hunt is a 59 y.o. male who has left knee pain.  He had MRI which showed: IMPRESSION: 1. Age advanced tricompartmental degenerative changes most significant in the medial compartment. 2. Intact ligamentous structures and no acute bony findings. 3. Meniscal degenerative changes without discrete tear. 4. Small joint effusion and numerous loose bodies in the joint suggesting synovial osteochondromatosis.  I have explained the findings to him.  He feels better today.  He is wearing his brace which really helps.  He may need arthroscopy but his knee is better and I will delay that for now.  He agrees.   Body mass index is 34.06 kg/m.  ROS  Review of Systems  HENT: Negative for congestion.   Respiratory: Negative for cough and shortness of breath.   Cardiovascular: Negative for chest pain and leg swelling.  Endocrine: Positive for cold intolerance.  Musculoskeletal: Positive for arthralgias and joint swelling.  Allergic/Immunologic: Positive for environmental allergies.  Neurological: Negative for numbness.  All other systems reviewed and are negative.   All other systems reviewed and are negative.  The following is a summary of the past history medically, past history surgically, known current medicines, social history and family history.  This information is gathered electronically by the computer from prior information and documentation.  I review this each visit and have found including this information at this point in the chart is beneficial and informative.    Past Medical History:  Diagnosis Date   Allergy    Arthritis    Gout    Hyperlipidemia    Hypertension    Personal history of colonic adenoma 09/28/2012    Past Surgical History:  Procedure Laterality Date   APPENDECTOMY  1988   CARPAL TUNNEL  RELEASE     bilateral 2000 and 2001   COLONOSCOPY  09/23/2012   ELBOW SURGERY  2006   muscle repair   EXPLORATORY LAPAROTOMY  1996   after falling on scissors; repair of "hole in intestines"   [place]    Family History  Problem Relation Age of Onset   Kidney cancer Mother    Diabetes Mother    COPD Mother    Anuerysm Father    Diabetes Father    Heart disease Father    Diabetes Sister    Colon cancer Neg Hx    Colon polyps Neg Hx    Esophageal cancer Neg Hx    Rectal cancer Neg Hx    Stomach cancer Neg Hx     Social History Social History   Tobacco Use   Smoking status: Never Smoker   Smokeless tobacco: Current User    Types: Snuff  Vaping Use   Vaping Use: Never used  Substance Use Topics   Alcohol use: No   Drug use: No    Allergies  Allergen Reactions   Iohexol Anaphylaxis    Allergy to IVP dye    Current Outpatient Medications  Medication Sig Dispense Refill   allopurinol (ZYLOPRIM) 300 MG tablet Take 1 tablet (300 mg total) by mouth daily. 90 tablet 1   atorvastatin (LIPITOR) 40 MG tablet Take 1 tablet (40 mg total) by mouth daily at 6 PM. 90 tablet 1   cetirizine (ZYRTEC) 10 MG  tablet TAKE 1 TABLET DAILY 90 tablet 3   colchicine 0.6 MG tablet One tid po for five days. 15 tablet 5   diazepam (VALIUM) 5 MG tablet TAKE (1) TABLET TWICE A DAY AS NEEDED FOR ARM SPASM 60 tablet 2   HYDROcodone-acetaminophen (NORCO) 7.5-325 MG tablet TAKE 1 TABLET EVERY 4 HOURS AS NEEDED FOR MODERATE PAIN 110 tablet 0   hydrocortisone (ANUSOL-HC) 2.5 % rectal cream Place 1 application rectally 2 (two) times daily as needed for hemorrhoids or anal itching. 30 g 0   lisinopril (ZESTRIL) 10 MG tablet Take 1 tablet (10 mg total) by mouth daily. 90 tablet 1   naproxen (NAPROSYN) 500 MG tablet TAKE 1 TABLET ONCE DAILY WITH MEAL 60 tablet 5   promethazine (PHENERGAN) 12.5 MG  tablet Take 1 tablet (12.5 mg total) by mouth every 8 (eight) hours as needed for nausea or vomiting. 20 tablet 0   No current facility-administered medications for this visit.     Physical Exam  Blood pressure 118/74, pulse 61, height 5\' 8"  (1.727 m), weight 224 lb (101.6 kg).  Constitutional: overall normal hygiene, normal nutrition, well developed, normal grooming, normal body habitus. Assistive device:hinge knee brace  Musculoskeletal: gait and station Limp left, muscle tone and strength are normal, no tremors or atrophy is present.  .  Neurological: coordination overall normal.  Deep tendon reflex/nerve stretch intact.  Sensation normal.  Cranial nerves II-XII intact.   Skin:   Normal overall no scars, lesions, ulcers or rashes. No psoriasis.  Psychiatric: Alert and oriented x 3.  Recent memory intact, remote memory unclear.  Normal mood and affect. Well groomed.  Good eye contact.  Cardiovascular: overall no swelling, no varicosities, no edema bilaterally, normal temperatures of the legs and arms, no clubbing, cyanosis and good capillary refill.  Lymphatic: palpation is normal.  Left knee is tender, effusion, crepitus, ROM 0 to 110, stable, NV intact.  All other systems reviewed and are negative   The patient has been educated about the nature of the problem(s) and counseled on treatment options.  The patient appeared to understand what I have discussed and is in agreement with it.  Encounter Diagnosis  Name Primary?   Chronic pain of left knee Yes    PLAN Call if any problems.  Precautions discussed.  Continue current medications.   Return to clinic 1 month   Electronically Signed Sanjuana Kava, MD 11/9/202110:48 AM

## 2020-05-07 ENCOUNTER — Other Ambulatory Visit: Payer: Self-pay | Admitting: Orthopaedic Surgery

## 2020-05-07 NOTE — Telephone Encounter (Signed)
Patient of Dr Brooke Bonito requests refill on Hydrocodone/Acetaminophen 7.5-325 Mgs. Qty 110  XBW:IOMB 1 TABLET EVERY 4 HOURS AS NEEDED FOR MODERATE PAIN  Patient states he uses [facility]

## 2020-05-08 MED ORDER — HYDROCODONE-ACETAMINOPHEN 7.5-325 MG PO TABS: 1.0000 | ORAL_TABLET | ORAL | 0 refills | Status: DC | PRN

## 2020-05-08 NOTE — Telephone Encounter (Signed)
Please advise 

## 2020-05-09 NOTE — Telephone Encounter (Signed)
Dr. Amedeo Kinsman sent in 5 day supply. No other concerns.

## 2020-05-16 MED ORDER — HYDROCODONE-ACETAMINOPHEN 7.5-325 MG PO TABS: 1.0000 | ORAL_TABLET | ORAL | 0 refills | Status: AC | PRN

## 2020-05-16 NOTE — Addendum Note (Signed)
Addended by: Willette Pa on: 05/16/2020 12:48 PM   Modules accepted: Orders

## 2020-05-22 ENCOUNTER — Ambulatory Visit: Payer: Medicare Other | Admitting: Orthopaedic Surgery

## 2020-05-22 ENCOUNTER — Other Ambulatory Visit: Payer: Self-pay

## 2020-05-22 ENCOUNTER — Encounter: Payer: Self-pay | Admitting: Orthopaedic Surgery

## 2020-05-22 VITALS — BP 133/86 | HR 65 | Ht 68.0 in | Wt 222.0 lb

## 2020-05-22 DIAGNOSIS — M25562 Pain in left knee: Secondary | ICD-10-CM | POA: Diagnosis not present

## 2020-05-22 DIAGNOSIS — G8929 Other chronic pain: Secondary | ICD-10-CM | POA: Diagnosis not present

## 2020-05-22 MED ORDER — HYDROCODONE-ACETAMINOPHEN 5-325 MG PO TABS
1.0000 | ORAL_TABLET | Freq: Four times a day (QID) | ORAL | 0 refills | Status: DC | PRN
Start: 2020-05-22 — End: 2020-06-19

## 2020-05-22 NOTE — Progress Notes (Signed)
Patient Andre Hunt, male DOB:11/09/1960, 58 y.o. [id number]  Chief Complaint  Patient presents with  . Elbow Pain    right side,   . Knee Pain    left,     HPI  Andre Hunt is a 59 y.o. male who has left knee pain.  MRI had shown loose bodies. He does not want surgery.  He wears a brace and says it helps.  He has good and bad days.   Body mass index is 33.75 kg/m.  ROS  Review of Systems  HENT: Negative for congestion.   Respiratory: Negative for cough and shortness of breath.   Cardiovascular: Negative for chest pain and leg swelling.  Endocrine: Positive for cold intolerance.  Musculoskeletal: Positive for arthralgias and joint swelling.  Allergic/Immunologic: Positive for environmental allergies.  Neurological: Negative for numbness.  All other systems reviewed and are negative.   All other systems reviewed and are negative.  The following is a summary of the past history medically, past history surgically, known current medicines, social history and family history.  This information is gathered electronically by the computer from prior information and documentation.  I review this each visit and have found including this information at this point in the chart is beneficial and informative.    Past Medical History:  Diagnosis Date  . Allergy   . Arthritis   . Gout   . Hyperlipidemia   . Hypertension   . Personal history of colonic adenoma 09/28/2012    Past Surgical History:  Procedure Laterality Date  . APPENDECTOMY  1988  . CARPAL TUNNEL RELEASE     bilateral 2000 and 2001  . COLONOSCOPY  09/23/2012  . ELBOW SURGERY  2006   muscle repair  . EXPLORATORY LAPAROTOMY  1996   after falling on scissors; repair of "hole in intestines"  . [place]  . POLYPECTOMY    . UMBILICAL HERNIA REPAIR  1968    Family History  Problem Relation Age of Onset  . Kidney cancer Mother   . Diabetes Mother   . COPD Mother   . Anuerysm  Father   . Diabetes Father   . Heart disease Father   . Diabetes Sister   . Colon cancer Neg Hx   . Colon polyps Neg Hx   . Esophageal cancer Neg Hx   . Rectal cancer Neg Hx   . Stomach cancer Neg Hx     Social History Social History   Tobacco Use  . Smoking status: Never Smoker  . Smokeless tobacco: Current User    Types: Snuff  Vaping Use  . Vaping Use: Never used  Substance Use Topics  . Alcohol use: No  . Drug use: No    Allergies  Allergen Reactions  . Iohexol Anaphylaxis    Allergy to IVP dye    Current Outpatient Medications  Medication Sig Dispense Refill  . allopurinol (ZYLOPRIM) 300 MG tablet Take 1 tablet (300 mg total) by mouth daily. 90 tablet 1  . atorvastatin (LIPITOR) 40 MG tablet Take 1 tablet (40 mg total) by mouth daily at 6 PM. 90 tablet 1  . cetirizine (ZYRTEC) 10 MG tablet TAKE 1 TABLET DAILY 90 tablet 3  . colchicine 0.6 MG tablet One tid po for five days. 15 tablet 5  . diazepam (VALIUM) 5 MG tablet TAKE (1) TABLET TWICE A DAY AS NEEDED FOR ARM SPASM 60 tablet 2  . hydrocortisone (ANUSOL-HC) 2.5 % rectal cream Place 1  application rectally 2 (two) times daily as needed for hemorrhoids or anal itching. 30 g 0  . lisinopril (ZESTRIL) 10 MG tablet Take 1 tablet (10 mg total) by mouth daily. 90 tablet 1  . naproxen (NAPROSYN) 500 MG tablet TAKE 1 TABLET ONCE DAILY WITH MEAL 60 tablet 5  . promethazine (PHENERGAN) 12.5 MG tablet Take 1 tablet (12.5 mg total) by mouth every 8 (eight) hours as needed for nausea or vomiting. 20 tablet 0  . HYDROcodone-acetaminophen (NORCO/VICODIN) 5-325 MG tablet Take 1 tablet by mouth every 6 (six) hours as needed for moderate pain (Must last 30 days.). 110 tablet 0   No current facility-administered medications for this visit.     Physical Exam  Blood pressure 133/86, pulse 65, height 5\' 8"  (1.727 m), weight 222 lb (100.7 kg).  Constitutional: overall normal hygiene, normal nutrition, well developed, normal  grooming, normal body habitus. Assistive device:left knee brace  Musculoskeletal: gait and station Limp left, muscle tone and strength are normal, no tremors or atrophy is present.  .  Neurological: coordination overall normal.  Deep tendon reflex/nerve stretch intact.  Sensation normal.  Cranial nerves II-XII intact.   Skin:   Normal overall no scars, lesions, ulcers or rashes. No psoriasis.  Psychiatric: Alert and oriented x 3.  Recent memory intact, remote memory unclear.  Normal mood and affect. Well groomed.  Good eye contact.  Cardiovascular: overall no swelling, no varicosities, no edema bilaterally, normal temperatures of the legs and arms, no clubbing, cyanosis and good capillary refill.  Lymphatic: palpation is normal.  Left knee is tender, ROM is 0 to 110, crepitus, effusion, limp left.  All other systems reviewed and are negative   The patient has been educated about the nature of the problem(s) and counseled on treatment options.  The patient appeared to understand what I have discussed and is in agreement with it.  Encounter Diagnosis  Name Primary?  . Chronic pain of left knee Yes    PLAN Call if any problems.  Precautions discussed.  Continue current medications.   Return to clinic 1 month   I was unable to get on the state narcotic site.  Web services say unavailable.  I did renew his pain medicine.  Electronically Signed Sanjuana Kava, MD 12/7/20211:50 PM

## 2020-06-19 ENCOUNTER — Encounter: Payer: Self-pay | Admitting: Orthopaedic Surgery

## 2020-06-19 ENCOUNTER — Ambulatory Visit: Payer: Medicare Other | Admitting: Orthopaedic Surgery

## 2020-06-19 ENCOUNTER — Other Ambulatory Visit: Payer: Self-pay

## 2020-06-19 VITALS — BP 125/81 | HR 66 | Ht 68.0 in | Wt 229.2 lb

## 2020-06-19 DIAGNOSIS — M25562 Pain in left knee: Secondary | ICD-10-CM | POA: Diagnosis not present

## 2020-06-19 DIAGNOSIS — G8929 Other chronic pain: Secondary | ICD-10-CM | POA: Diagnosis not present

## 2020-06-19 MED ORDER — HYDROCODONE-ACETAMINOPHEN 5-325 MG PO TABS: 1.0000 | ORAL_TABLET | Freq: Four times a day (QID) | ORAL | 0 refills | Status: AC | PRN

## 2020-06-19 NOTE — Progress Notes (Signed)
Patient Andre Hunt, male DOB:11-10-1960, 60 y.o. [id number]  Chief Complaint  Patient presents with  . Knee Pain    L/ knee is hurting pretty bad. R Shoulder is doing ok.    HPI  Andre Hunt is a 60 y.o. male who has more and more pain of the left knee.  MRI in October showed no meniscus tear but significant DJD and multiple small loose bodies.  Injections have not helped.  I will have him see Dr. Romeo Apple or Dr. Dallas Schimke to discuss possible surgery.  He has to wear a brace.  The knee gives way.  It swells.  It hurts more medially.   Body mass index is 34.86 kg/m.  ROS  Review of Systems  HENT: Negative for congestion.   Respiratory: Negative for cough and shortness of breath.   Cardiovascular: Negative for chest pain and leg swelling.  Endocrine: Positive for cold intolerance.  Musculoskeletal: Positive for arthralgias and joint swelling.  Allergic/Immunologic: Positive for environmental allergies.  Neurological: Negative for numbness.  All other systems reviewed and are negative.   All other systems reviewed and are negative.  The following is a summary of the past history medically, past history surgically, known current medicines, social history and family history.  This information is gathered electronically by the computer from prior information and documentation.  I review this each visit and have found including this information at this point in the chart is beneficial and informative.    Past Medical History:  Diagnosis Date  . Allergy   . Arthritis   . Gout   . Hyperlipidemia   . Hypertension   . Personal history of colonic adenoma 09/28/2012    Past Surgical History:  Procedure Laterality Date  . APPENDECTOMY  1988  . CARPAL TUNNEL RELEASE     bilateral 2000 and 2001  . COLONOSCOPY  09/23/2012  . ELBOW SURGERY  2006   muscle repair  . EXPLORATORY LAPAROTOMY  1996   after falling on scissors; repair of "hole in intestines"  . INCISIONAL  HERNIA REPAIR  1997  . POLYPECTOMY    . UMBILICAL HERNIA REPAIR  1968    Family History  Problem Relation Age of Onset  . Kidney cancer Mother   . Diabetes Mother   . COPD Mother   . Anuerysm Father   . Diabetes Father   . Heart disease Father   . Diabetes Sister   . Colon cancer Neg Hx   . Colon polyps Neg Hx   . Esophageal cancer Neg Hx   . Rectal cancer Neg Hx   . Stomach cancer Neg Hx     Social History Social History   Tobacco Use  . Smoking status: Never Smoker  . Smokeless tobacco: Current User    Types: Snuff  Vaping Use  . Vaping Use: Never used  Substance Use Topics  . Alcohol use: No  . Drug use: No    Allergies  Allergen Reactions  . Iohexol Anaphylaxis    Allergy to IVP dye    Current Outpatient Medications  Medication Sig Dispense Refill  . allopurinol (ZYLOPRIM) 300 MG tablet Take 1 tablet (300 mg total) by mouth daily. 90 tablet 1  . atorvastatin (LIPITOR) 40 MG tablet Take 1 tablet (40 mg total) by mouth daily at 6 PM. 90 tablet 1  . cetirizine (ZYRTEC) 10 MG tablet TAKE 1 TABLET DAILY 90 tablet 3  . colchicine 0.6 MG tablet One tid po for five days. 15  tablet 5  . diazepam (VALIUM) 5 MG tablet TAKE (1) TABLET TWICE A DAY AS NEEDED FOR ARM SPASM 60 tablet 2  . hydrocortisone (ANUSOL-HC) 2.5 % rectal cream Place 1 application rectally 2 (two) times daily as needed for hemorrhoids or anal itching. 30 g 0  . lisinopril (ZESTRIL) 10 MG tablet Take 1 tablet (10 mg total) by mouth daily. 90 tablet 1  . naproxen (NAPROSYN) 500 MG tablet TAKE 1 TABLET ONCE DAILY WITH MEAL 60 tablet 5  . promethazine (PHENERGAN) 12.5 MG tablet Take 1 tablet (12.5 mg total) by mouth every 8 (eight) hours as needed for nausea or vomiting. 20 tablet 0  . HYDROcodone-acetaminophen (NORCO/VICODIN) 5-325 MG tablet Take 1 tablet by mouth every 6 (six) hours as needed for moderate pain (Must last 30 days.). 110 tablet 0   No current facility-administered medications for this  visit.     Physical Exam  Blood pressure 125/81, pulse 66, height 5\' 8"  (1.727 m), weight 229 lb 4 oz (104 kg).  Constitutional: overall normal hygiene, normal nutrition, well developed, normal grooming, normal body habitus. Assistive device:left knee brace  Musculoskeletal: gait and station Limp left, muscle tone and strength are normal, no tremors or atrophy is present.  .  Neurological: coordination overall normal.  Deep tendon reflex/nerve stretch intact.  Sensation normal.  Cranial nerves II-XII intact.   Skin:   Normal overall no scars, lesions, ulcers or rashes. No psoriasis.  Psychiatric: Alert and oriented x 3.  Recent memory intact, remote memory unclear.  Normal mood and affect. Well groomed.  Good eye contact.  Cardiovascular: overall no swelling, no varicosities, no edema bilaterally, normal temperatures of the legs and arms, no clubbing, cyanosis and good capillary refill.  Lymphatic: palpation is normal.  Left knee has effusion, crepitus, ROM 0 to 110, medial pain, stable.  Limp left.  All other systems reviewed and are negative   The patient has been educated about the nature of the problem(s) and counseled on treatment options.  The patient appeared to understand what I have discussed and is in agreement with it.  Encounter Diagnosis  Name Primary?  . Chronic pain of left knee Yes    PLAN Call if any problems.  Precautions discussed.  Continue current medications.   Return to clinic to see Dr. Aline Brochure or Dr. Amedeo Kinsman   I have reviewed the [facility] Controlled Substance Reporting System web site prior to prescribing narcotic medicine for this patient.   Electronically Signed Sanjuana Kava, MD 1/4/202210:47 AM

## 2020-06-25 ENCOUNTER — Telehealth: Payer: Self-pay | Admitting: Orthopaedic Surgery

## 2020-06-27 ENCOUNTER — Encounter: Payer: Self-pay | Admitting: Orthopedic Surgery

## 2020-06-27 ENCOUNTER — Other Ambulatory Visit: Payer: Self-pay

## 2020-06-27 ENCOUNTER — Ambulatory Visit (INDEPENDENT_AMBULATORY_CARE_PROVIDER_SITE_OTHER): Payer: Medicare Other | Admitting: Orthopedic Surgery

## 2020-06-27 ENCOUNTER — Ambulatory Visit: Payer: Medicare Other

## 2020-06-27 VITALS — BP 125/78 | HR 76 | Ht 68.0 in | Wt 227.4 lb

## 2020-06-27 DIAGNOSIS — M25562 Pain in left knee: Secondary | ICD-10-CM

## 2020-06-27 DIAGNOSIS — M1712 Unilateral primary osteoarthritis, left knee: Secondary | ICD-10-CM | POA: Diagnosis not present

## 2020-06-27 DIAGNOSIS — M6720 Synovial hypertrophy, not elsewhere classified, unspecified site: Secondary | ICD-10-CM

## 2020-06-27 DIAGNOSIS — G8929 Other chronic pain: Secondary | ICD-10-CM

## 2020-06-27 NOTE — Progress Notes (Signed)
New Patient Visit  Assessment: Andre Hunt is a 60 y.o. male with the following: Moderate left knee arthritis Synovial chondromatosis   Plan: Andre Hunt has moderate arthritis in his left knee.  In addition, he has some loose bodies visible on both XR and MRI that most consistent with synovial chondromatosis.  Alone, these cna be painful, but not necessarily along the joint lines, especially when the loose bodies are within the suprapatellar pouch.  We reviewed the radiographs in clinic today, and I outlined the natural progression.  We had an extensive discussion regarding all potential treatment options, including continuing with the current treatment. NSAIDs are the most appropriate medications, and these are available OTC or via prescription.  I have urged him to remain active, and can continue with activities on their own, or we can refer them to physical therapy.  He should continue using his current brace. If the pain is severe enough, we can consider a steroid injection.  If their knee pain is affecting their everyday activities, including sleep, knee replacement is a consideration, but we would have to refer them to see my partner Dr. Aline Brochure.  After discussing all of these options, the patient has elected to proceed with his current pain medication and his brace.  He can return to clinic if his symptoms worsen.  All questions were answered.    Follow-up: Return if symptoms worsen or fail to improve.  Subjective:  Chief Complaint  Patient presents with  . Knee Pain    L/hurting bad for the last few days. I am here to discuss surgery.    History of Present Illness: Andre Hunt is a 60 y.o. male who presents for evaluation of his left knee.  He has been followed by Dr. Luna Glasgow for his knee.  He is taking Norco chronically for both his knee and right shoulder.  He recently had an injection which did not improve his symptoms.  He denies an injury to his knee.  He states  he has good days and bad days. The weather can really affect his pain, especially the rain.  He is wearing a hinged knee brace which significantly improves his pain.  He is currently complaining of lateral knee pain with radiating pain distally.  He recently had an MRI and is here to discuss the results.  He also has a history of gout, with flares in his left knee.    Review of Systems: No fevers or chills No numbness or tingling No chest pain No shortness of breath No bowel or bladder dysfunction No GI distress No headaches   Medical History:  Past Medical History:  Diagnosis Date  . Allergy   . Arthritis   . Gout   . Hyperlipidemia   . Hypertension   . Personal history of colonic adenoma 09/28/2012    Past Surgical History:  Procedure Laterality Date  . APPENDECTOMY  1988  . CARPAL TUNNEL RELEASE     bilateral 2000 and 2001  . COLONOSCOPY  09/23/2012  . ELBOW SURGERY  2006   muscle repair  . EXPLORATORY LAPAROTOMY  1996   after falling on scissors; repair of "hole in intestines"  . [place]  . POLYPECTOMY    . UMBILICAL HERNIA REPAIR  1968    Family History  Problem Relation Age of Onset  . Kidney cancer Mother   . Diabetes Mother   . COPD Mother   . Anuerysm Father   . Diabetes Father   .  Heart disease Father   . Diabetes Sister   . Colon cancer Neg Hx   . Colon polyps Neg Hx   . Esophageal cancer Neg Hx   . Rectal cancer Neg Hx   . Stomach cancer Neg Hx    Social History   Tobacco Use  . Smoking status: Never Smoker  . Smokeless tobacco: Current User    Types: Snuff  Vaping Use  . Vaping Use: Never used  Substance Use Topics  . Alcohol use: No  . Drug use: No    Allergies  Allergen Reactions  . Iohexol Anaphylaxis    Allergy to IVP dye    Current Meds  Medication Sig  . allopurinol (ZYLOPRIM) 300 MG tablet Take 1 tablet (300 mg total) by mouth daily.  Marland Kitchen atorvastatin (LIPITOR) 40 MG tablet Take 1 tablet (40 mg total)  by mouth daily at 6 PM.  . cetirizine (ZYRTEC) 10 MG tablet TAKE 1 TABLET DAILY  . colchicine 0.6 MG tablet One tid po for five days.  . diazepam (VALIUM) 5 MG tablet TAKE (1) TABLET TWICE A DAY AS NEEDED FOR ARM SPASM  . HYDROcodone-acetaminophen (NORCO/VICODIN) 5-325 MG tablet Take 1 tablet by mouth every 6 (six) hours as needed for moderate pain (Must last 30 days.).  Marland Kitchen hydrocortisone (ANUSOL-HC) 2.5 % rectal cream Place 1 application rectally 2 (two) times daily as needed for hemorrhoids or anal itching.  Marland Kitchen lisinopril (ZESTRIL) 10 MG tablet Take 1 tablet (10 mg total) by mouth daily.  . naproxen (NAPROSYN) 500 MG tablet TAKE 1 TABLET ONCE DAILY WITH MEAL  . promethazine (PHENERGAN) 12.5 MG tablet Take 1 tablet (12.5 mg total) by mouth every 8 (eight) hours as needed for nausea or vomiting.    Objective: BP 125/78   Pulse 76   Ht 5\' 8"  (1.727 m)   Wt 227 lb 6 oz (103.1 kg)   BMI 34.57 kg/m   Physical Exam:  General:  Alert and oriented, no acute distress.  Gait: Mild left sided antalgic gait  Left knee without effusion.  Clinically, neutral alignment.  ROM from 0-130 with some discomfort at extreme of flexion.  TTP lateral joint line.  Negative Lachman.  No increased laxity to varus or valgus stress. No pain with patellar grind.  Crepitus with ROM testing.     IMAGING: I personally ordered and reviewed the following images   XR of the left knee demonstrates moderate tricompartmental arthritis.  He has some ossified loose fragments within the suprapatellar pouch.  Small osteophytes within the medial and patellofemoral compartment.   Impression: moderate left knee arthritis; loose bodies within suprapatellar pouch, consistent with synovial chondromatosis.   MRI shows degenerative fraying of menisci, cartilage thinning and loose bodies.    New Medications:  No orders of the defined types were placed in this encounter.     Mordecai Rasmussen, MD  06/27/2020 8:38 PM

## 2020-07-11 ENCOUNTER — Other Ambulatory Visit: Payer: Self-pay | Admitting: Nurse Practitioner

## 2020-07-11 DIAGNOSIS — F411 Generalized anxiety disorder: Secondary | ICD-10-CM

## 2020-07-23 ENCOUNTER — Telehealth: Payer: Self-pay | Admitting: Orthopaedic Surgery

## 2020-07-23 MED ORDER — HYDROCODONE-ACETAMINOPHEN 5-325 MG PO TABS: 1.0000 | ORAL_TABLET | Freq: Four times a day (QID) | ORAL | 0 refills | Status: AC | PRN

## 2020-07-23 NOTE — Telephone Encounter (Signed)
Patient request refill on pain medicine   HYDROcodone-acetaminophen (NORCO/VICODIN) 5-325 MG tablet    Pharmacy:  [facility]

## 2020-08-27 ENCOUNTER — Telehealth: Payer: Self-pay | Admitting: Orthopaedic Surgery

## 2020-08-27 MED ORDER — HYDROCODONE-ACETAMINOPHEN 5-325 MG PO TABS: 1.0000 | ORAL_TABLET | Freq: Four times a day (QID) | ORAL | 0 refills | Status: AC | PRN

## 2020-08-27 NOTE — Telephone Encounter (Signed)
Patient requests Hydrocodone/Acetaminophen 5-325 mgs.  Qty  110  Sig: Take 1 tablet by mouth every 6 (six) hours as needed for moderate pain (Must last 30 days.).  Patient states he uses [facility]

## 2020-08-30 ENCOUNTER — Encounter: Payer: Self-pay | Admitting: Orthopaedic Surgery

## 2020-08-30 ENCOUNTER — Ambulatory Visit: Payer: Medicare Other | Admitting: Orthopaedic Surgery

## 2020-08-30 ENCOUNTER — Other Ambulatory Visit: Payer: Self-pay

## 2020-08-30 VITALS — BP 123/79 | HR 58 | Ht 68.0 in | Wt 229.0 lb

## 2020-08-30 DIAGNOSIS — M25562 Pain in left knee: Secondary | ICD-10-CM

## 2020-08-30 DIAGNOSIS — M25511 Pain in right shoulder: Secondary | ICD-10-CM | POA: Diagnosis not present

## 2020-08-30 DIAGNOSIS — G8929 Other chronic pain: Secondary | ICD-10-CM | POA: Diagnosis not present

## 2020-08-30 NOTE — Progress Notes (Signed)
Patient Andre Hunt, male DOB:1961/02/07, 60 y.o. QIH:[id number]  Chief Complaint  Patient presents with  . Shoulder Pain    Right shld pain, flared x 3 or so days now.  ROM ok, pain from shld to elbow    HPI  Andre Hunt is a 60 y.o. male who has chronic right shoulder pain.  He has good and bad days.  The cold weather has made it worse.  He is taking his medicine.  He saw Dr. Amedeo Kinsman about his knee.  I have reviewed the report.   Body mass index is 34.82 kg/m.  ROS  Review of Systems  HENT: Negative for congestion.   Respiratory: Negative for cough and shortness of breath.   Cardiovascular: Negative for chest pain and leg swelling.  Endocrine: Positive for cold intolerance.  Musculoskeletal: Positive for arthralgias and joint swelling.  Allergic/Immunologic: Positive for environmental allergies.  Neurological: Negative for numbness.  All other systems reviewed and are negative.   All other systems reviewed and are negative.  The following is a summary of the past history medically, past history surgically, known current medicines, social history and family history.  This information is gathered electronically by the computer from prior information and documentation.  I review this each visit and have found including this information at this point in the chart is beneficial and informative.    Past Medical History:  Diagnosis Date  . Allergy   . Arthritis   . Gout   . Hyperlipidemia   . Hypertension   . Personal history of colonic adenoma 09/28/2012    Past Surgical History:  Procedure Laterality Date  . APPENDECTOMY  1988  . CARPAL TUNNEL RELEASE     bilateral 2000 and 2001  . COLONOSCOPY  09/23/2012  . ELBOW SURGERY  2006   muscle repair  . EXPLORATORY LAPAROTOMY  1996   after falling on scissors; repair of "hole in intestines"  . [place]  . POLYPECTOMY    . UMBILICAL HERNIA REPAIR  1968    Family History  Problem Relation  Age of Onset  . Kidney cancer Mother   . Diabetes Mother   . COPD Mother   . Anuerysm Father   . Diabetes Father   . Heart disease Father   . Diabetes Sister   . Colon cancer Neg Hx   . Colon polyps Neg Hx   . Esophageal cancer Neg Hx   . Rectal cancer Neg Hx   . Stomach cancer Neg Hx     Social History Social History   Tobacco Use  . Smoking status: Never Smoker  . Smokeless tobacco: Current User    Types: Snuff  Vaping Use  . Vaping Use: Never used  Substance Use Topics  . Alcohol use: No  . Drug use: No    Allergies  Allergen Reactions  . Iohexol Anaphylaxis    Allergy to IVP dye    Current Outpatient Medications  Medication Sig Dispense Refill  . allopurinol (ZYLOPRIM) 300 MG tablet Take 1 tablet (300 mg total) by mouth daily. 90 tablet 1  . atorvastatin (LIPITOR) 40 MG tablet Take 1 tablet (40 mg total) by mouth daily at 6 PM. 90 tablet 1  . cetirizine (ZYRTEC) 10 MG tablet TAKE 1 TABLET DAILY 90 tablet 3  . colchicine 0.6 MG tablet One tid po for five days. 15 tablet 5  . diazepam (VALIUM) 5 MG tablet TAKE (1) TABLET TWICE A DAY AS NEEDED FOR  ARM SPASM 60 tablet 0  . HYDROcodone-acetaminophen (NORCO/VICODIN) 5-325 MG tablet Take 1 tablet by mouth every 6 (six) hours as needed for moderate pain (Must last 30 days.). 110 tablet 0  . hydrocortisone (ANUSOL-HC) 2.5 % rectal cream Place 1 application rectally 2 (two) times daily as needed for hemorrhoids or anal itching. 30 g 0  . lisinopril (ZESTRIL) 10 MG tablet Take 1 tablet (10 mg total) by mouth daily. 90 tablet 1  . naproxen (NAPROSYN) 500 MG tablet TAKE 1 TABLET ONCE DAILY WITH MEAL 60 tablet 5  . promethazine (PHENERGAN) 12.5 MG tablet Take 1 tablet (12.5 mg total) by mouth every 8 (eight) hours as needed for nausea or vomiting. 20 tablet 0   No current facility-administered medications for this visit.     Physical Exam  Blood pressure 123/79, pulse (!) 58, height 5\' 8"  (1.727 m), weight 229 lb (103.9  kg).  Constitutional: overall normal hygiene, normal nutrition, well developed, normal grooming, normal body habitus. Assistive device:left knee brace  Musculoskeletal: gait and station Limp left, muscle tone and strength are normal, no tremors or atrophy is present.  .  Neurological: coordination overall normal.  Deep tendon reflex/nerve stretch intact.  Sensation normal.  Cranial nerves II-XII intact.   Skin:   Normal overall no scars, lesions, ulcers or rashes. No psoriasis.  Psychiatric: Alert and oriented x 3.  Recent memory intact, remote memory unclear.  Normal mood and affect. Well groomed.  Good eye contact.  Cardiovascular: overall no swelling, no varicosities, no edema bilaterally, normal temperatures of the legs and arms, no clubbing, cyanosis and good capillary refill.  Lymphatic: palpation is normal.  Right shoulder has good ROM, pain in the extremes.  NV intact.  All other systems reviewed and are negative   The patient has been educated about the nature of the problem(s) and counseled on treatment options.  The patient appeared to understand what I have discussed and is in agreement with it.  Encounter Diagnoses  Name Primary?  . Chronic right shoulder pain Yes  . Chronic pain of left knee     PLAN Call if any problems.  Precautions discussed.  Continue current medications.   Return to clinic 3 months   Electronically Signed Sanjuana Kava, MD 3/17/20229:58 AM

## 2020-09-10 ENCOUNTER — Ambulatory Visit: Payer: Self-pay | Admitting: Nurse Practitioner

## 2020-09-14 ENCOUNTER — Ambulatory Visit (INDEPENDENT_AMBULATORY_CARE_PROVIDER_SITE_OTHER): Payer: Medicare Other | Admitting: Nurse Practitioner

## 2020-09-14 ENCOUNTER — Other Ambulatory Visit: Payer: Self-pay

## 2020-09-14 ENCOUNTER — Encounter: Payer: Self-pay | Admitting: Nurse Practitioner

## 2020-09-14 VITALS — BP 111/74 | HR 60 | Temp 97.8°F | Resp 20 | Ht 68.0 in | Wt 228.0 lb

## 2020-09-14 DIAGNOSIS — Z6834 Body mass index (BMI) 34.0-34.9, adult: Secondary | ICD-10-CM

## 2020-09-14 DIAGNOSIS — Z125 Encounter for screening for malignant neoplasm of prostate: Secondary | ICD-10-CM

## 2020-09-14 DIAGNOSIS — I10 Essential (primary) hypertension: Secondary | ICD-10-CM

## 2020-09-14 DIAGNOSIS — M1A00X0 Idiopathic chronic gout, unspecified site, without tophus (tophi): Secondary | ICD-10-CM

## 2020-09-14 DIAGNOSIS — F411 Generalized anxiety disorder: Secondary | ICD-10-CM

## 2020-09-14 DIAGNOSIS — E785 Hyperlipidemia, unspecified: Secondary | ICD-10-CM | POA: Diagnosis not present

## 2020-09-14 MED ORDER — ATORVASTATIN CALCIUM 40 MG PO TABS: 40.0000 mg | ORAL_TABLET | Freq: Every day | ORAL | 1 refills | Status: DC

## 2020-09-14 MED ORDER — DIAZEPAM 5 MG PO TABS: 5.0000 mg | ORAL_TABLET | Freq: Two times a day (BID) | ORAL | 1 refills | Status: DC | PRN

## 2020-09-14 MED ORDER — ALLOPURINOL 300 MG PO TABS: 300.0000 mg | ORAL_TABLET | Freq: Every day | ORAL | 1 refills | Status: DC

## 2020-09-14 MED ORDER — LISINOPRIL 10 MG PO TABS: 10.0000 mg | ORAL_TABLET | Freq: Every day | ORAL | 1 refills | Status: DC

## 2020-09-14 NOTE — Patient Instructions (Signed)
Exercising to Stay Healthy To become healthy and stay healthy, it is recommended that you do moderate-intensity and vigorous-intensity exercise. You can tell that you are exercising at a moderate intensity if your heart starts beating faster and you start breathing faster but can still hold a conversation. You can tell that you are exercising at a vigorous intensity if you are breathing much harder and faster and cannot hold a conversation while exercising. Exercising regularly is important. It has many health benefits, such as:  Improving overall fitness, flexibility, and endurance.  Increasing bone density.  Helping with weight control.  Decreasing body fat.  Increasing muscle strength.  Reducing stress and tension.  Improving overall health. How often should I exercise? Choose an activity that you enjoy, and set realistic goals. Your health care provider can help you make an activity plan that works for you. Exercise regularly as told by your health care provider. This may include:  Doing strength training two times a week, such as: ? Lifting weights. ? Using resistance bands. ? Push-ups. ? Sit-ups. ? Yoga.  Doing a certain intensity of exercise for a given amount of time. Choose from these options: ? A total of 150 minutes of moderate-intensity exercise every week. ? A total of 75 minutes of vigorous-intensity exercise every week. ? A mix of moderate-intensity and vigorous-intensity exercise every week. Children, pregnant women, people who have not exercised regularly, people who are overweight, and older adults may need to talk with a health care provider about what activities are safe to do. If you have a medical condition, be sure to talk with your health care provider before you start a new exercise program. What are some exercise ideas? Moderate-intensity exercise ideas include:  Walking 1 mile (1.6 km) in about 15  minutes.  Biking.  Hiking.  Golfing.  Dancing.  Water aerobics. Vigorous-intensity exercise ideas include:  Walking 4.5 miles (7.2 km) or more in about 1 hour.  Jogging or running 5 miles (8 km) in about 1 hour.  Biking 10 miles (16.1 km) or more in about 1 hour.  Lap swimming.  Roller-skating or in-line skating.  Cross-country skiing.  Vigorous competitive sports, such as football, basketball, and soccer.  Jumping rope.  Aerobic dancing.   What are some everyday activities that can help me to get exercise?  Yard work, such as: ? Pushing a lawn mower. ? Raking and bagging leaves.  Washing your car.  Pushing a stroller.  Shoveling snow.  Gardening.  Washing windows or floors. How can I be more active in my day-to-day activities?  Use stairs instead of an elevator.  Take a walk during your lunch break.  If you drive, park your car farther away from your work or school.  If you take public transportation, get off one stop early and walk the rest of the way.  Stand up or walk around during all of your indoor phone calls.  Get up, stretch, and walk around every 30 minutes throughout the day.  Enjoy exercise with a friend. Support to continue exercising will help you keep a regular routine of activity. What guidelines can I follow while exercising?  Before you start a new exercise program, talk with your health care provider.  Do not exercise so much that you hurt yourself, feel dizzy, or get very short of breath.  Wear comfortable clothes and wear shoes with good support.  Drink plenty of water while you exercise to prevent dehydration or heat stroke.  Work out until   your breathing and your heartbeat get faster. Where to find more information  [place] Department of Health and Human Services: www.hhs.gov  Centers for Disease Control and Prevention (CDC): www.cdc.gov Summary  Exercising regularly is important. It will improve your overall fitness,  flexibility, and endurance.  Regular exercise also will improve your overall health. It can help you control your weight, reduce stress, and improve your bone density.  Do not exercise so much that you hurt yourself, feel dizzy, or get very short of breath.  Before you start a new exercise program, talk with your health care provider. This information is not intended to replace advice given to you by your health care provider. Make sure you discuss any questions you have with your health care provider. Document Revised: 05/15/2017 Document Reviewed: 04/23/2017 Elsevier Patient Education  2021 Elsevier Inc.  

## 2020-09-14 NOTE — Progress Notes (Signed)
Subjective:    Patient ID: Andre Hunt, male    DOB: [birth year], 60 y.o.   MRN: [id number]   Chief Complaint: Medical Management of Chronic Issues    HPI:  1. Essential hypertension No c/o chest pain, sob or headache. Does not check blood pressure at home. BP Readings from Last 3 Encounters:  09/14/20 111/74  08/30/20 123/79  06/27/20 125/78     2. Hyperlipidemia with target LDL less than 100 Not wtahcing diet and does little to no exercise. Is on lipitor Lab Results  Component Value Date   CHOL 110 04/18/2019   HDL 41 04/18/2019   LDLCALC 52 04/18/2019   TRIG 86 04/18/2019   CHOLHDL 2.7 04/18/2019     3. GAD (generalized anxiety disorder) Stays anxious. Is  On valium as needed. Doe snot take every day GAD 7 : Generalized Anxiety Score 03/13/2020 07/21/2019 04/18/2019  Nervous, Anxious, on Edge 3 1 3   Control/stop worrying 3 2 0  Worry too much - different things 3 2 0  Trouble relaxing 3 2 3   Restless 0 2 3  Easily annoyed or irritable 1 2 3   Afraid - awful might happen 1 0 3  Total GAD 7 Score 14 11 15   Anxiety Difficulty Not difficult at all Somewhat difficult -       4. Idiopathic chronic gout without tophus, unspecified site Has had no gout flare ups  5. BMI 34.0-34.9,adult No recent weight changes Wt Readings from Last 3 Encounters:  09/14/20 228 lb (103.4 kg)  08/30/20 229 lb (103.9 kg)  06/27/20 227 lb 6 oz (103.1 kg)   BMI Readings from Last 3 Encounters:  09/14/20 34.67 kg/m  08/30/20 34.82 kg/m  06/27/20 34.57 kg/m      Outpatient Encounter Medications as of 09/14/2020  Medication Sig  . allopurinol (ZYLOPRIM) 300 MG tablet Take 1 tablet (300 mg total) by mouth daily.  Marland Kitchen atorvastatin (LIPITOR) 40 MG tablet Take 1 tablet (40 mg total) by mouth daily at 6 PM.  . cetirizine (ZYRTEC) 10 MG tablet TAKE 1 TABLET DAILY  . colchicine 0.6 MG tablet One tid po for five days.  . diazepam (VALIUM) 5 MG tablet TAKE (1) TABLET TWICE A DAY AS  NEEDED FOR ARM SPASM  . HYDROcodone-acetaminophen (NORCO/VICODIN) 5-325 MG tablet Take 1 tablet by mouth every 6 (six) hours as needed for moderate pain (Must last 30 days.).  Marland Kitchen hydrocortisone (ANUSOL-HC) 2.5 % rectal cream Place 1 application rectally 2 (two) times daily as needed for hemorrhoids or anal itching.  Marland Kitchen lisinopril (ZESTRIL) 10 MG tablet Take 1 tablet (10 mg total) by mouth daily.  . naproxen (NAPROSYN) 500 MG tablet TAKE 1 TABLET ONCE DAILY WITH MEAL  . promethazine (PHENERGAN) 12.5 MG tablet Take 1 tablet (12.5 mg total) by mouth every 8 (eight) hours as needed for nausea or vomiting.   No facility-administered encounter medications on file as of 09/14/2020.    Past Surgical History:  Procedure Laterality Date  . APPENDECTOMY  1988  . CARPAL TUNNEL RELEASE     bilateral 2000 and 2001  . COLONOSCOPY  09/23/2012  . ELBOW SURGERY  2006   muscle repair  . EXPLORATORY LAPAROTOMY  1996   after falling on scissors; repair of "hole in intestines"  . [place]  . POLYPECTOMY    . UMBILICAL HERNIA REPAIR  1968    Family History  Problem Relation Age of Onset  . Kidney cancer Mother   .  Diabetes Mother   . COPD Mother   . Anuerysm Father   . Diabetes Father   . Heart disease Father   . Diabetes Sister   . Colon cancer Neg Hx   . Colon polyps Neg Hx   . Esophageal cancer Neg Hx   . Rectal cancer Neg Hx   . Stomach cancer Neg Hx     New complaints: None today  Social history: Lives with his wife  Controlled substance contract: 09/14/20     Review of Systems  Constitutional: Negative for diaphoresis.  Eyes: Negative for pain.  Respiratory: Negative for shortness of breath.   Cardiovascular: Negative for chest pain, palpitations and leg swelling.  Gastrointestinal: Negative for abdominal pain.  Endocrine: Negative for polydipsia.  Skin: Negative for rash.  Neurological: Negative for dizziness, weakness and headaches.  Hematological: Does  not bruise/bleed easily.  All other systems reviewed and are negative.      Objective:   Physical Exam Vitals and nursing note reviewed.  Constitutional:      Appearance: Normal appearance. He is well-developed.  HENT:     Head: Normocephalic.     Nose: Nose normal.  Eyes:     Pupils: Pupils are equal, round, and reactive to light.  Neck:     Thyroid: No thyroid mass or thyromegaly.     Vascular: No carotid bruit or JVD.     Trachea: Phonation normal.  Cardiovascular:     Rate and Rhythm: Normal rate and regular rhythm.  Pulmonary:     Effort: Pulmonary effort is normal. No respiratory distress.     Breath sounds: Normal breath sounds.  Abdominal:     General: Bowel sounds are normal.     Palpations: Abdomen is soft.     Tenderness: There is no abdominal tenderness.  Musculoskeletal:        General: Normal range of motion.     Cervical back: Normal range of motion and neck supple.  Lymphadenopathy:     Cervical: No cervical adenopathy.  Skin:    General: Skin is warm and dry.  Neurological:     Mental Status: He is alert and oriented to person, place, and time.  Psychiatric:        Behavior: Behavior normal.        Thought Content: Thought content normal.        Judgment: Judgment normal.     BP 111/74   Pulse 60   Temp 97.8 F (36.6 C) (Temporal)   Resp 20   Ht 5' 8"  (1.727 m)   Wt 228 lb (103.4 kg)   SpO2 96%   BMI 34.67 kg/m        Assessment & Plan:  Andre Hunt comes in today with chief complaint of Medical Management of Chronic Issues   Diagnosis and orders addressed:  1. Essential hypertension Low sodium diet - lisinopril (ZESTRIL) 10 MG tablet; Take 1 tablet (10 mg total) by mouth daily.  Dispense: 90 tablet; Refill: 1 - CBC with Differential/Platelet - CMP14+EGFR  2. Hyperlipidemia with target LDL less than 100 Low fat diet - atorvastatin (LIPITOR) 40 MG tablet; Take 1 tablet (40 mg total) by mouth daily at 6 PM.  Dispense: 90  tablet; Refill: 1 - Lipid panel  3. GAD (generalized anxiety disorder) Stress management - diazepam (VALIUM) 5 MG tablet; Take 1 tablet (5 mg total) by mouth every 12 (twelve) hours as needed for anxiety.  Dispense: 60 tablet; Refill: 1  4. Idiopathic  chronic gout without tophus, unspecified site - allopurinol (ZYLOPRIM) 300 MG tablet; Take 1 tablet (300 mg total) by mouth daily.  Dispense: 90 tablet; Refill: 1  5. BMI 34.0-34.9,adult Discussed diet and exercise for person with BMI >25 Will recheck weight in 3-6 months   6. Prostate cancer screening Labs pending - PSA, total and free   Labs pending Health Maintenance reviewed Diet and exercise encouraged  Follow up plan: 6 months   Mary-Margaret Hassell Done, FNP

## 2020-09-15 LAB — CMP14+EGFR
ALT: 43 IU/L (ref 0–44)
AST: 31 IU/L (ref 0–40)
Albumin/Globulin Ratio: 1.9 (ref 1.2–2.2)
Albumin: 4.7 g/dL (ref 3.8–4.9)
Alkaline Phosphatase: 67 IU/L (ref 44–121)
BUN/Creatinine Ratio: 16 (ref 9–20)
BUN: 19 mg/dL (ref 6–24)
Bilirubin Total: 0.5 mg/dL (ref 0.0–1.2)
CO2: 24 mmol/L (ref 20–29)
Calcium: 9.5 mg/dL (ref 8.7–10.2)
Chloride: 101 mmol/L (ref 96–106)
Creatinine, Ser: 1.19 mg/dL (ref 0.76–1.27)
Globulin, Total: 2.5 g/dL (ref 1.5–4.5)
Glucose: 90 mg/dL (ref 65–99)
Potassium: 4.9 mmol/L (ref 3.5–5.2)
Sodium: 140 mmol/L (ref 134–144)
Total Protein: 7.2 g/dL (ref 6.0–8.5)
eGFR: 70 mL/min/{1.73_m2} (ref >59)

## 2020-09-15 LAB — CBC WITH DIFFERENTIAL/PLATELET
Basophils Absolute: 0 10*3/uL (ref 0.0–0.2)
Basos: 1 %
EOS (ABSOLUTE): 0.1 10*3/uL (ref 0.0–0.4)
Eos: 1 %
Hematocrit: 44.7 % (ref 37.5–51.0)
Hemoglobin: 15.4 g/dL (ref 13.0–17.7)
Immature Grans (Abs): 0 10*3/uL (ref 0.0–0.1)
Immature Granulocytes: 0 %
Lymphocytes Absolute: 2 10*3/uL (ref 0.7–3.1)
Lymphs: 34 %
MCH: 30.7 pg (ref 26.6–33.0)
MCHC: 34.5 g/dL (ref 31.5–35.7)
MCV: 89 fL (ref 79–97)
Monocytes Absolute: 0.5 10*3/uL (ref 0.1–0.9)
Monocytes: 9 %
Neutrophils Absolute: 3.3 10*3/uL (ref 1.4–7.0)
Neutrophils: 55 %
Platelets: 246 10*3/uL (ref 150–450)
RBC: 5.02 x10E6/uL (ref 4.14–5.80)
RDW: 12.9 % (ref 11.6–15.4)
WBC: 5.8 10*3/uL (ref 3.4–10.8)

## 2020-09-15 LAB — PSA, TOTAL AND FREE
PSA, Free Pct: 63.3 %
PSA, Free: 0.57 ng/mL
Prostate Specific Ag, Serum: 0.9 ng/mL (ref 0.0–4.0)

## 2020-09-15 LAB — LIPID PANEL
Chol/HDL Ratio: 3 ratio (ref 0.0–5.0)
Cholesterol, Total: 119 mg/dL (ref 100–199)
HDL: 40 mg/dL (ref >39)
LDL Chol Calc (NIH): 58 mg/dL (ref 0–99)
Triglycerides: 112 mg/dL (ref 0–149)
VLDL Cholesterol Cal: 21 mg/dL (ref 5–40)

## 2020-09-27 ENCOUNTER — Telehealth: Payer: Self-pay | Admitting: Radiology

## 2020-09-27 MED ORDER — HYDROCODONE-ACETAMINOPHEN 5-325 MG PO TABS: 1.0000 | ORAL_TABLET | Freq: Four times a day (QID) | ORAL | 0 refills | Status: AC | PRN

## 2020-09-27 NOTE — Telephone Encounter (Signed)
Patient called requested refill of hydrocodone.  I did not see it active to request a refill in message and send to you.  Pt uses [facility].

## 2020-09-27 NOTE — Addendum Note (Signed)
Addended by: Willette Pa on: 09/27/2020 11:01 AM   Modules accepted: Orders

## 2020-10-25 NOTE — Telephone Encounter (Signed)
error 

## 2020-10-29 ENCOUNTER — Telehealth: Payer: Self-pay | Admitting: Orthopaedic Surgery

## 2020-10-29 MED ORDER — HYDROCODONE-ACETAMINOPHEN 5-325 MG PO TABS: 1.0000 | ORAL_TABLET | Freq: Four times a day (QID) | ORAL | 0 refills | Status: AC | PRN

## 2020-10-29 NOTE — Telephone Encounter (Signed)
Patient requests Hydrocodone/Acetaminophen 5-325 mgs.  Qty  110  Sig: Take 1 tablet by mouth every 6 (six) hours as needed for moderate pain (Must last 30 days.).  Patient states he uses [facility] 

## 2020-11-29 ENCOUNTER — Encounter: Payer: Self-pay | Admitting: Orthopaedic Surgery

## 2020-11-29 ENCOUNTER — Other Ambulatory Visit: Payer: Self-pay

## 2020-11-29 ENCOUNTER — Ambulatory Visit: Payer: Medicare Other | Admitting: Orthopaedic Surgery

## 2020-11-29 VITALS — BP 123/79 | HR 57 | Ht 68.0 in | Wt 222.0 lb

## 2020-11-29 DIAGNOSIS — M25562 Pain in left knee: Secondary | ICD-10-CM

## 2020-11-29 DIAGNOSIS — M25511 Pain in right shoulder: Secondary | ICD-10-CM | POA: Diagnosis not present

## 2020-11-29 DIAGNOSIS — G8929 Other chronic pain: Secondary | ICD-10-CM

## 2020-11-29 DIAGNOSIS — M7711 Lateral epicondylitis, right elbow: Secondary | ICD-10-CM | POA: Diagnosis not present

## 2020-11-29 MED ORDER — HYDROCORTISONE (PERIANAL) 2.5 % EX CREA: 1.0000 "application " | TOPICAL_CREAM | Freq: Two times a day (BID) | CUTANEOUS | 0 refills | Status: DC | PRN

## 2020-11-29 MED ORDER — HYDROCODONE-ACETAMINOPHEN 5-325 MG PO TABS: 1.0000 | ORAL_TABLET | Freq: Four times a day (QID) | ORAL | 0 refills | Status: DC | PRN

## 2020-11-29 MED ORDER — PREDNISONE 10 MG (21) PO TBPK: ORAL_TABLET | ORAL | 1 refills | Status: DC

## 2020-11-29 NOTE — Progress Notes (Signed)
My right elbow is hurting more.  He has a flare up of his right tennis elbow.  He has long history of pain in the elbow.  He has recurrence of his pain.  He has no trauma.  He has more pain over the lateral epicondyle.  His left knee is tender.  He is using his brace. He has swelling and popping but no giving way.  Right elbow has tenderness over the lateral epicondyle with pain to resisted stress in dorsiflexion.  He has no redness or swelling.  NV intact.  Left knee has effusion, crepitus, ROM 0 to 110, stable.  Encounter Diagnoses  Name Primary?   Lateral epicondylitis, right elbow Yes   Chronic right shoulder pain    Chronic pain of left knee    I will call in prednisone dose pack for elbow.  I have reviewed the [place] web site prior to prescribing narcotic medicine for this patient.  Return in three months.  Call if any problem.  Precautions discussed.  Electronically Signed Sanjuana Kava, MD 6/16/20229:57 AM

## 2020-12-27 ENCOUNTER — Telehealth: Payer: Self-pay | Admitting: Orthopaedic Surgery

## 2020-12-27 MED ORDER — HYDROCODONE-ACETAMINOPHEN 5-325 MG PO TABS: 1.0000 | ORAL_TABLET | Freq: Four times a day (QID) | ORAL | 0 refills | Status: AC | PRN

## 2020-12-27 NOTE — Telephone Encounter (Signed)
Patient called for refill HYDROcodone-acetaminophen (NORCO/VICODIN) 5-325 MG tablet 110 tablet     [facility]

## 2021-01-14 ENCOUNTER — Other Ambulatory Visit: Payer: Self-pay | Admitting: Nurse Practitioner

## 2021-01-14 DIAGNOSIS — F411 Generalized anxiety disorder: Secondary | ICD-10-CM

## 2021-01-28 ENCOUNTER — Telehealth: Payer: Self-pay | Admitting: Orthopaedic Surgery

## 2021-01-28 MED ORDER — HYDROCODONE-ACETAMINOPHEN 5-325 MG PO TABS: 1.0000 | ORAL_TABLET | Freq: Four times a day (QID) | ORAL | 0 refills | Status: AC | PRN

## 2021-01-28 NOTE — Telephone Encounter (Signed)
Patient called for refill: HYDROcodone-acetaminophen (NORCO/VICODIN) 5-325 MG tablet 110 tablet   [facility]

## 2021-02-26 ENCOUNTER — Other Ambulatory Visit: Payer: Self-pay | Admitting: Nurse Practitioner

## 2021-02-26 DIAGNOSIS — I10 Essential (primary) hypertension: Secondary | ICD-10-CM

## 2021-02-26 DIAGNOSIS — E785 Hyperlipidemia, unspecified: Secondary | ICD-10-CM

## 2021-02-28 ENCOUNTER — Other Ambulatory Visit: Payer: Self-pay

## 2021-02-28 ENCOUNTER — Encounter: Payer: Self-pay | Admitting: Orthopaedic Surgery

## 2021-02-28 ENCOUNTER — Ambulatory Visit: Payer: Medicare Other | Admitting: Orthopaedic Surgery

## 2021-02-28 VITALS — BP 130/78 | HR 59 | Ht 68.0 in | Wt 221.4 lb

## 2021-02-28 DIAGNOSIS — G8929 Other chronic pain: Secondary | ICD-10-CM | POA: Diagnosis not present

## 2021-02-28 DIAGNOSIS — M25562 Pain in left knee: Secondary | ICD-10-CM | POA: Diagnosis not present

## 2021-02-28 DIAGNOSIS — M7711 Lateral epicondylitis, right elbow: Secondary | ICD-10-CM

## 2021-02-28 MED ORDER — HYDROCODONE-ACETAMINOPHEN 5-325 MG PO TABS: 1.0000 | ORAL_TABLET | Freq: Four times a day (QID) | ORAL | 0 refills | Status: DC | PRN

## 2021-02-28 NOTE — Progress Notes (Signed)
My knee is tender  He has to wear the brace on the left knee because of pain.  As long as he uses the brace, he does well.  He has swelling and popping but no giving way.  He has no new trauma.  The right elbow is stable.  Right elbow has full ROM and no pain today. The left knee has effusion crepitus and some medial pain. ROM 0 to 110.  Brace in place.  NV intact.  Encounter Diagnoses  Name Primary?   Chronic pain of left knee Yes   Lateral epicondylitis, right elbow    I have reviewed the [place] web site prior to prescribing narcotic medicine for this patient.  Return in three months.  Call if any problem.  Precautions discussed.  Electronically Signed Sanjuana Kava, MD 9/15/202210:14 AM

## 2021-03-04 IMAGING — MR MR KNEE*L* W/O CM
7 series · 40 of 40 positions shown · non-contrast
Comparison: None.

CLINICAL DATA: Left knee pain for 6 months.

EXAM:
MRI OF THE LEFT KNEE WITHOUT CONTRAST
TECHNIQUE: Multiplanar, multisequence MR imaging of the knee was performed. No
intravenous contrast was administered.

[Series 8: T2 fat-sat · axial · left · 4.0mm · 0.47mm/px · z∈[-88,+37]mm · 5 of 26 slices shown (1 of 3)]
[im 1/26]
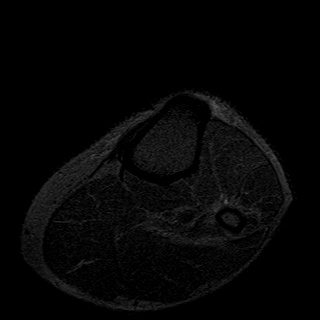
[im 7/26]
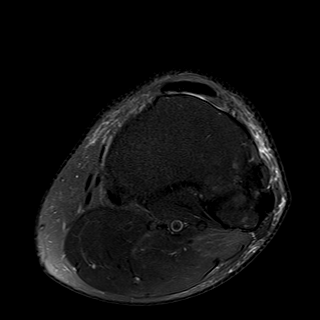
[im 13/26]
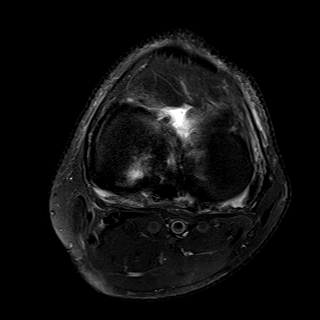
[im 19/26]
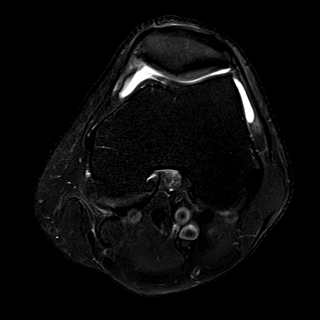
[im 26/26]
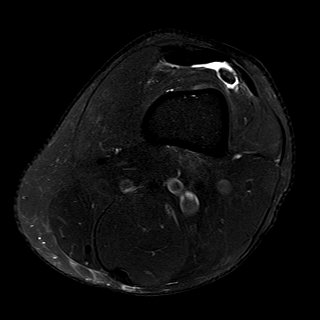

[Series 9: T1 · coronal · left · 4.0mm · 0.59mm/px · 6 of 27 slices shown]
[im 1/27]
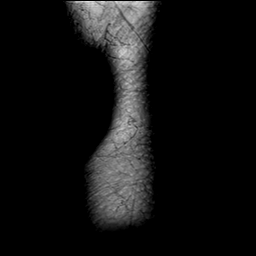
[im 6/27]
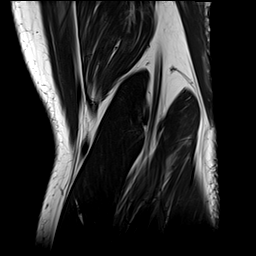
[im 11/27]
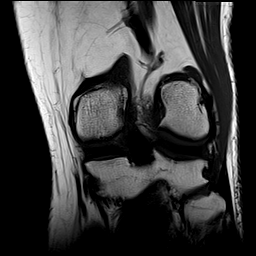
[im 16/27]
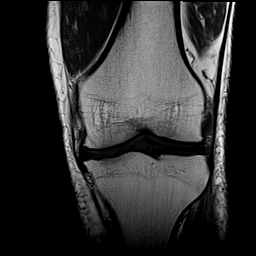
[im 21/27]
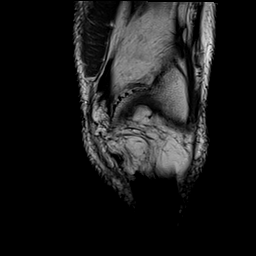
[im 27/27]
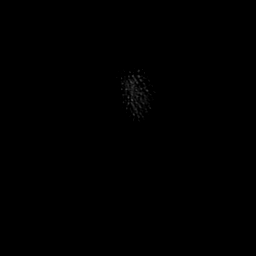

[Series 10: T2 fat-sat · coronal · left · 4.0mm · 0.59mm/px · 6 of 28 slices shown (2 of 3)]
[im 1/28]
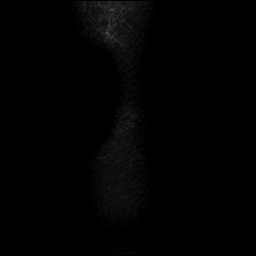
[im 6/28]
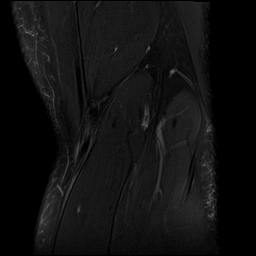
[im 11/28]
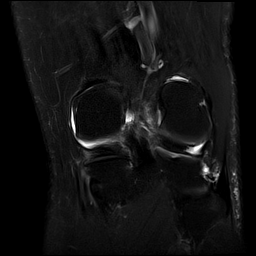
[im 17/28]
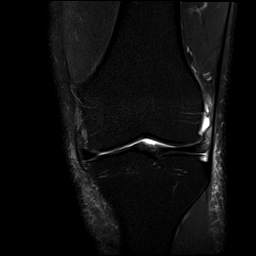
[im 22/28]
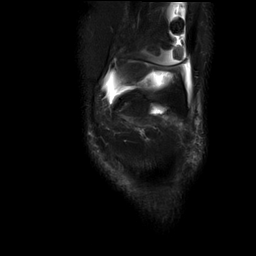
[im 28/28]
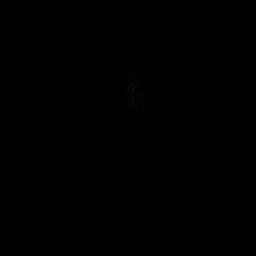

[Series 11: PD fat-sat · coronal · left · 3.0mm · 0.59mm/px · 7 of 35 slices shown (1 of 2)]
[im 1/35]
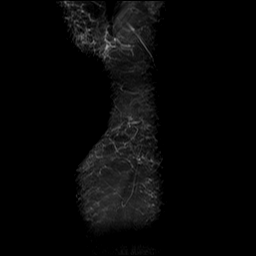
[im 6/35]
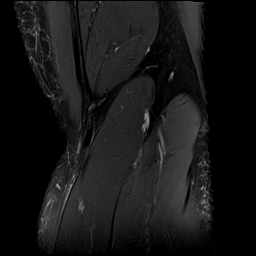
[im 12/35]
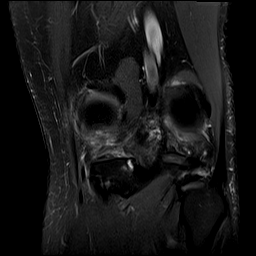
[im 18/35]
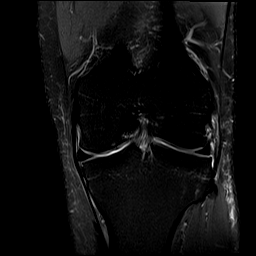
[im 23/35]
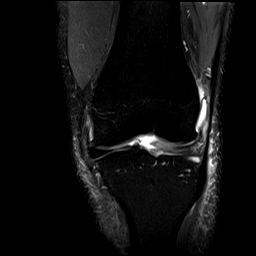
[im 29/35]
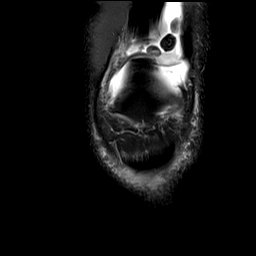
[im 35/35]
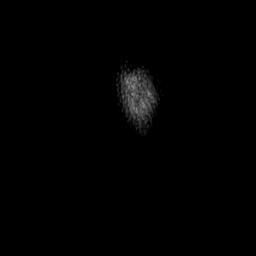

[Series 12: PD fat-sat · sagittal · left · 3.0mm · 0.59mm/px · 6 of 28 slices shown (2 of 2)]
[im 1/28]
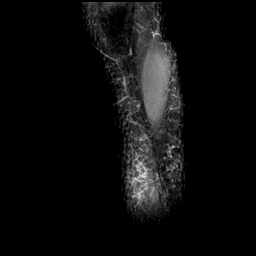
[im 6/28]
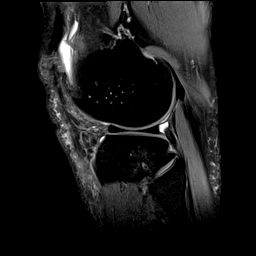
[im 11/28]
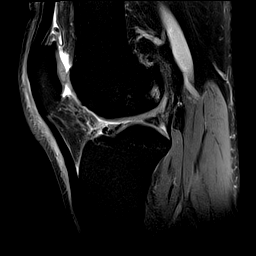
[im 17/28]
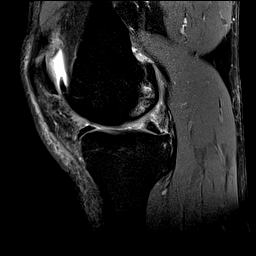
[im 22/28]
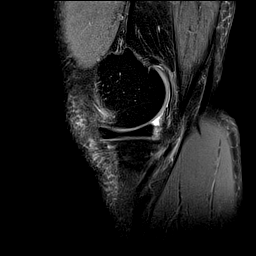
[im 28/28]
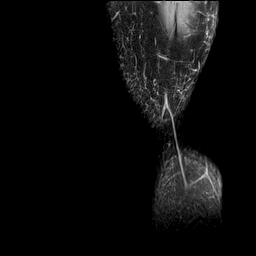

[Series 13: T2 fat-sat · sagittal · left · 3.0mm · 0.59mm/px · 6 of 29 slices shown (3 of 3)]
[im 1/29]
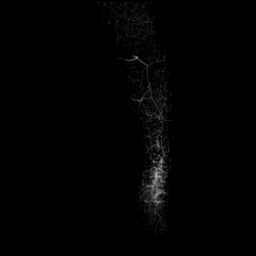
[im 6/29]
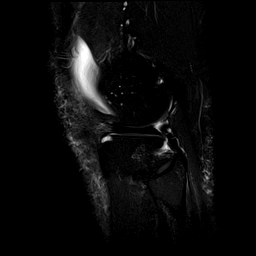
[im 12/29]
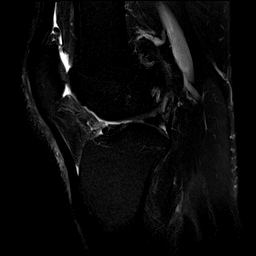
[im 17/29]
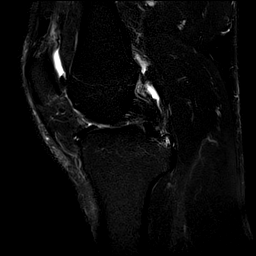
[im 23/29]
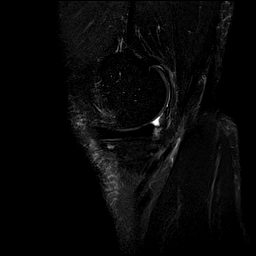
[im 29/29]
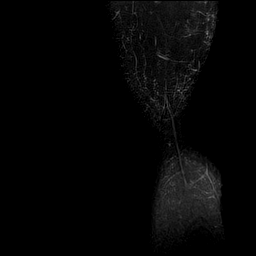

[Series 14: PD · coronal · left · 2.0mm · 0.47mm/px · 4 of 20 slices shown]
[im 1/20]
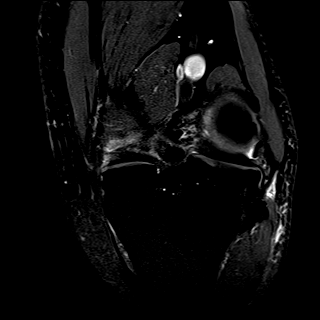
[im 7/20]
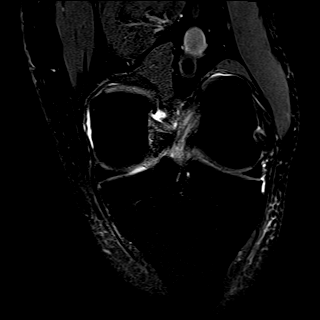
[im 13/20]
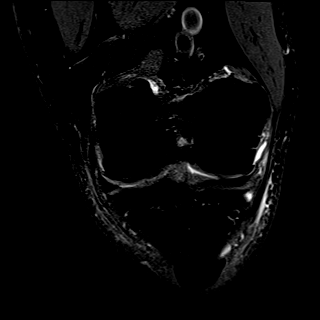
[im 20/20]
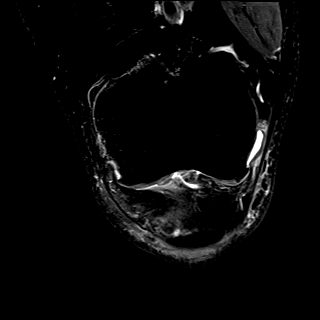

[40 of 40 positions shown; findings below may reference images not displayed]

FINDINGS: MENISCI

Medial meniscus: Mild intrasubstance degenerative changes but no
tear.

Lateral meniscus: Mild intrasubstance degenerative changes but no
discrete tear.

LIGAMENTS

Cruciates:  Intact.  Mild mucoid degeneration of the ACL.

Collaterals:  Intact

CARTILAGE

Patellofemoral:  Moderate degenerative chondrosis.

Medial: Age advanced degenerative chondrosis with cartilage
thinning, fraying and fibrillation. Early spurring changes.

Lateral:  Mild degenerative chondrosis with early spurring changes.

Joint: Small joint effusion. Numerous loose bodies are noted in the
joint suggesting synovial osteochondromatosis.

Popliteal Fossa:  No popliteal mass or Baker's cyst.

Extensor Mechanism: The patella retinacular structures are intact
and the quadriceps and patellar tendons are intact.

Bones: No acute bony findings. No bone lesions or osteochondral
abnormality. Mild degenerative changes are noted at the tibiofibular
joint.

Other: Unremarkable knee musculature.
IMPRESSION: 1. Age advanced tricompartmental degenerative changes most
significant in the medial compartment.
2. Intact ligamentous structures and no acute bony findings.
3. Meniscal degenerative changes without discrete tear.
4. Small joint effusion and numerous loose bodies in the joint
suggesting synovial osteochondromatosis.

## 2021-03-29 ENCOUNTER — Telehealth: Payer: Self-pay

## 2021-03-29 NOTE — Telephone Encounter (Signed)
Hydrocodone-Acetaminophen 5/325 mg  Qty 110 Tablets  PATIENT USES [facility]

## 2021-04-01 MED ORDER — HYDROCODONE-ACETAMINOPHEN 5-325 MG PO TABS: 1.0000 | ORAL_TABLET | Freq: Four times a day (QID) | ORAL | 0 refills | Status: AC | PRN

## 2021-04-02 ENCOUNTER — Other Ambulatory Visit: Payer: Self-pay | Admitting: Nurse Practitioner

## 2021-04-02 DIAGNOSIS — I10 Essential (primary) hypertension: Secondary | ICD-10-CM

## 2021-04-02 DIAGNOSIS — E785 Hyperlipidemia, unspecified: Secondary | ICD-10-CM

## 2021-04-02 NOTE — Telephone Encounter (Signed)
MMM NTBS 30 days given 02/26/21

## 2021-04-16 ENCOUNTER — Other Ambulatory Visit: Payer: Self-pay

## 2021-04-16 ENCOUNTER — Encounter: Payer: Self-pay | Admitting: Nurse Practitioner

## 2021-04-16 ENCOUNTER — Ambulatory Visit (INDEPENDENT_AMBULATORY_CARE_PROVIDER_SITE_OTHER): Payer: Medicare Other | Admitting: Nurse Practitioner

## 2021-04-16 VITALS — BP 114/77 | HR 65 | Temp 98.3°F | Resp 20 | Ht 68.0 in | Wt 224.0 lb

## 2021-04-16 DIAGNOSIS — I10 Essential (primary) hypertension: Secondary | ICD-10-CM

## 2021-04-16 DIAGNOSIS — M1A00X0 Idiopathic chronic gout, unspecified site, without tophus (tophi): Secondary | ICD-10-CM | POA: Diagnosis not present

## 2021-04-16 DIAGNOSIS — Z6834 Body mass index (BMI) 34.0-34.9, adult: Secondary | ICD-10-CM

## 2021-04-16 DIAGNOSIS — E785 Hyperlipidemia, unspecified: Secondary | ICD-10-CM

## 2021-04-16 DIAGNOSIS — F411 Generalized anxiety disorder: Secondary | ICD-10-CM | POA: Diagnosis not present

## 2021-04-16 MED ORDER — ALLOPURINOL 300 MG PO TABS: 300.0000 mg | ORAL_TABLET | Freq: Every day | ORAL | 1 refills | Status: DC

## 2021-04-16 MED ORDER — DIAZEPAM 5 MG PO TABS: 5.0000 mg | ORAL_TABLET | Freq: Two times a day (BID) | ORAL | 5 refills | Status: DC | PRN

## 2021-04-16 MED ORDER — LISINOPRIL 10 MG PO TABS: 10.0000 mg | ORAL_TABLET | Freq: Every day | ORAL | 0 refills | Status: DC

## 2021-04-16 MED ORDER — ATORVASTATIN CALCIUM 40 MG PO TABS: 40.0000 mg | ORAL_TABLET | Freq: Every day | ORAL | 1 refills | Status: DC

## 2021-04-16 NOTE — Progress Notes (Signed)
Subjective:    Patient ID: Andre Hunt, male    DOB: September 12, 1960, 60 y.o.   MRN: [id number]   Chief Complaint: Medical Management of Chronic Issues    HPI:  1. Essential hypertension No c/o chest pain, sob or headache. Does not check blood pressure at home. BP Readings from Last 3 Encounters:  04/16/21 114/77  02/28/21 130/78  11/29/20 123/79     2. Hyperlipidemia with target LDL less than 100 Does try to watch diet but does no dedicated exercise. Lab Results  Component Value Date   CHOL 119 09/14/2020   HDL 40 09/14/2020   LDLCALC 58 09/14/2020   TRIG 112 09/14/2020   CHOLHDL 3.0 09/14/2020     3. 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 GAD (generalized anxiety disorder) Has to take care of his wife which cause stress. Is on valium bid GAD 7 : Generalized Anxiety Score 04/16/2021 03/13/2020 07/21/2019 04/18/2019  Nervous, Anxious, on Edge 0 3 1 3   Control/stop worrying 0 3 2 0  Worry too much - different things 0 3 2 0  Trouble relaxing 0 3 2 3   Restless 0 0 2 3  Easily annoyed or irritable 0 1 2 3   Afraid - awful might happen 0 1 0 3  Total GAD 7 Score 0 14 11 15   Anxiety Difficulty Not difficult at all Not difficult at all Somewhat difficult -      5. BMI 34.0-34.9,adult No recent weight changes Wt Readings from Last 3 Encounters:  04/16/21 224 lb (101.6 kg)  02/28/21 221 lb 6.4 oz (100.4 kg)  11/29/20 222 lb (100.7 kg)   BMI Readings from Last 3 Encounters:  04/16/21 34.06 kg/m  02/28/21 33.66 kg/m  11/29/20 33.75 kg/m       Outpatient Encounter Medications as of 04/16/2021  Medication Sig   allopurinol (ZYLOPRIM) 300 MG tablet Take 1 tablet (300 mg total) by mouth daily.   atorvastatin (LIPITOR) 40 MG tablet Take 1 tablet (40 mg total) by mouth daily at 6 PM. (NEEDS TO BE SEEN BEFORE NEXT REFILL)   cetirizine (ZYRTEC) 10 MG tablet TAKE 1 TABLET DAILY   colchicine 0.6 MG tablet One tid po for  five days.   diazepam (VALIUM) 5 MG tablet TAKE 1 TABLET EVERY 12 HOURS AS NEEDED FOR ANXIETY   HYDROcodone-acetaminophen (NORCO/VICODIN) 5-325 MG tablet Take 1 tablet by mouth every 6 (six) hours as needed for moderate pain (Must last 30 days.).   hydrocortisone (ANUSOL-HC) 2.5 % rectal cream Place 1 application rectally 2 (two) times daily as needed for hemorrhoids or anal itching.   lisinopril (ZESTRIL) 10 MG tablet Take 1 tablet (10 mg total) by mouth daily. (NEEDS TO BE SEEN BEFORE NEXT REFILL)   naproxen (NAPROSYN) 500 MG tablet TAKE 1 TABLET ONCE DAILY WITH MEAL   promethazine (PHENERGAN) 12.5 MG tablet Take 1 tablet (12.5 mg total) by mouth every 8 (eight) hours as needed for nausea or vomiting.   No facility-administered encounter medications on file as of 04/16/2021.    Past Surgical History:  Procedure Laterality Date   APPENDECTOMY  1988   CARPAL TUNNEL RELEASE     bilateral 2000 and 2001   COLONOSCOPY  09/23/2012   ELBOW SURGERY  2006   muscle repair   EXPLORATORY LAPAROTOMY  1996   after falling on scissors; repair of "hole in intestines"   [facility]  UMBILICAL HERNIA REPAIR  1968    Family History  Problem Relation Age of Onset   Kidney cancer Mother    Diabetes Mother    COPD Mother    Anuerysm Father    Diabetes Father    Heart disease Father    Diabetes Sister    Colon cancer Neg Hx    Colon polyps Neg Hx    Esophageal cancer Neg Hx    Rectal cancer Neg Hx    Stomach cancer Neg Hx     New complaints: None today  Social history: Lives with his wife  Controlled substance contract: 09/18/20     Review of Systems  Constitutional:  Negative for diaphoresis.  Eyes:  Negative for pain.  Respiratory:  Negative for shortness of breath.   Cardiovascular:  Negative for chest pain, palpitations and leg swelling.  Gastrointestinal:  Negative for abdominal pain.  Endocrine: Negative for polydipsia.  Skin:  Negative for  rash.  Neurological:  Negative for dizziness, weakness and headaches.  Hematological:  Does not bruise/bleed easily.  All other systems reviewed and are negative.     Objective:   Physical Exam Vitals and nursing note reviewed.  Constitutional:      Appearance: Normal appearance. He is well-developed.  HENT:     Head: Normocephalic.     Nose: Nose normal.  Eyes:     Pupils: Pupils are equal, round, and reactive to light.  Neck:     Thyroid: No thyroid mass or thyromegaly.     Vascular: No carotid bruit or JVD.     Trachea: Phonation normal.  Cardiovascular:     Rate and Rhythm: Normal rate and regular rhythm.  Pulmonary:     Effort: Pulmonary effort is normal. No respiratory distress.     Breath sounds: Normal breath sounds.  Abdominal:     General: Bowel sounds are normal.     Palpations: Abdomen is soft.     Tenderness: There is no abdominal tenderness.  Musculoskeletal:        General: Normal range of motion.     Cervical back: Normal range of motion and neck supple.  Lymphadenopathy:     Cervical: No cervical adenopathy.  Skin:    General: Skin is warm and dry.  Neurological:     Mental Status: He is alert and oriented to person, place, and time.  Psychiatric:        Behavior: Behavior normal.        Thought Content: Thought content normal.        Judgment: Judgment normal.    BP 114/77   Pulse 65   Temp 98.3 F (36.8 C) (Temporal)   Resp 20   Ht 5' 8"  (1.727 m)   Wt 224 lb (101.6 kg)   SpO2 94%   BMI 34.06 kg/m        Assessment & Plan:  Andre Hunt comes in today with chief complaint of Medical Management of Chronic Issues   Diagnosis and orders addressed:  1. Essential hypertension Low sodium diet - CBC with Differential/Platelet - CMP14+EGFR - lisinopril (ZESTRIL) 10 MG tablet; Take 1 tablet (10 mg total) by mouth daily. (NEEDS TO BE SEEN BEFORE NEXT REFILL)  Dispense: 30 tablet; Refill: 0  2. Hyperlipidemia with target LDL less than  100 Low fat diet - atorvastatin (LIPITOR) 40 MG tablet; Take 1 tablet (40 mg total) by mouth daily at 6 PM. (NEEDS TO BE SEEN BEFORE NEXT REFILL)  Dispense: 90 tablet;  Refill: 1 - Lipid panel  3. 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  Dispense: 90 tablet; Refill: 1  4. GAD (generalized anxiety disorder) Stress management - diazepam (VALIUM) 5 MG tablet; Take 1 tablet (5 mg total) by mouth every 12 (twelve) hours as needed for anxiety.  Dispense: 60 tablet; Refill: 5  5. BMI 34.0-34.9,adult Discussed diet and exercise for person with BMI >25 Will recheck weight in 3-6 months    Labs pending Health Maintenance reviewed Diet and exercise encouraged  Follow up plan: 6 months   Mary-Margaret Hassell Done, FNP

## 2021-04-16 NOTE — Patient Instructions (Signed)
Stress, Adult Stress is a normal reaction to life events. Stress is what you feel when life demands more than you are used to, or more than you think you can handle. Some stress can be useful, such as studying for a test or meeting a deadline at work. Stress that occurs too often or for too long can cause problems. It can affect your emotional health and interfere with relationships and normal daily activities. Too much stress can weaken your body's defense system (immune system) and increase your risk for physical illness. If you already have a medical problem, stress can make it worse. What are the causes? All sorts of life events can cause stress. An event that causes stress for one person may not be stressful for another person. Major life events, whether positive or negative, commonly cause stress. Examples include: Losing a job or starting a new job. Losing a loved one. Moving to a new town or home. Getting married or divorced. Having a baby. Getting injured or sick. Less obvious life events can also cause stress, especially if they occur day after day or in combination with each other. Examples include: Working long hours. Driving in traffic. Caring for children. Being in debt. Being in a difficult relationship. What are the signs or symptoms? Stress can cause emotional symptoms, including: Anxiety. This is feeling worried, afraid, on edge, overwhelmed, or out of control. Anger, including irritation or impatience. Depression. This is feeling sad, down, helpless, or guilty. Trouble focusing, remembering, or making decisions. Stress can cause physical symptoms, including: Aches and pains. These may affect your head, neck, back, stomach, or other areas of your body. Tight muscles or a clenched jaw. Low energy. Trouble sleeping. Stress can cause unhealthy behaviors, including: Eating to feel better (overeating) or skipping meals. Working too much or putting off tasks. Smoking,  drinking alcohol, or using drugs to feel better. How is this diagnosed? Stress is diagnosed through an assessment by your health care provider. He or she may diagnose this condition based on: Your symptoms and any stressful life events. Your medical history. Tests to rule out other causes of your symptoms. Depending on your condition, your health care provider may refer you to a specialist for further evaluation. How is this treated? Stress management techniques are the recommended treatment for stress. Medicine is not typically recommended for the treatment of stress. Techniques to reduce your reaction to stressful life events include: Stress identification. Monitor yourself for symptoms of stress and identify what causes stress for you. These skills may help you to avoid or prepare for stressful events. Time management. Set your priorities, keep a calendar of events, and learn to say no. Taking these actions can help you avoid making too many commitments. Techniques for coping with stress include: Rethinking the problem. Try to think realistically about stressful events rather than ignoring them or overreacting. Try to find the positives in a stressful situation rather than focusing on the negatives. Exercise. Physical exercise can release both physical and emotional tension. The key is to find a form of exercise that you enjoy and do it regularly. Relaxation techniques. These relax the body and mind. The key is to find one or more that you enjoy and use the techniques regularly. Examples include: Meditation, deep breathing, or progressive relaxation techniques. Yoga or tai chi. Biofeedback, mindfulness techniques, or journaling. Listening to music, being out in nature, or participating in other hobbies. Practicing a healthy lifestyle. Eat a balanced diet, drink plenty of water, limit or  avoid caffeine, and get plenty of sleep. Having a strong support network. Spend time with family, friends,  or other people you enjoy being around. Express your feelings and talk things over with someone you trust. Counseling or talk therapy with a mental health professional may be helpful if you are having trouble managing stress on your own. Follow these instructions at home: Lifestyle  Avoid drugs. Do not use any products that contain nicotine or tobacco, such as cigarettes, e-cigarettes, and chewing tobacco. If you need help quitting, ask your health care provider. Limit alcohol intake to no more than 1 drink a day for nonpregnant women and 2 drinks a day for men. One drink equals 12 oz of beer, 5 oz of wine, or 1 oz of hard liquor Do not use alcohol or drugs to relax. Eat a balanced diet that includes fresh fruits and vegetables, whole grains, lean meats, fish, eggs, and beans, and low-fat dairy. Avoid processed foods and foods high in added fat, sugar, and salt. Exercise at least 30 minutes on 5 or more days each week. Get 7-8 hours of sleep each night. General instructions  Practice stress management techniques as discussed with your health care provider. Drink enough fluid to keep your urine clear or pale yellow. Take over-the-counter and prescription medicines only as told by your health care provider. Keep all follow-up visits as told by your health care provider. This is important. Contact a health care provider if: Your symptoms get worse. You have new symptoms. You feel overwhelmed by your problems and can no longer manage them on your own. Get help right away if: You have thoughts of hurting yourself or others. If you ever feel like you may hurt yourself or others, or have thoughts about taking your own life, get help right away. You can go to your nearest emergency department or call: Your local emergency services (911 in the [place] A suicide crisis helpline, such as the [facility] at (410)025-6148. This is open 24 hours a day. Summary Stress is a  normal reaction to life events. It can cause problems if it happens too often or for too long. Practicing stress management techniques is the best way to treat stress. Counseling or talk therapy with a mental health professional may be helpful if you are having trouble managing stress on your own. This information is not intended to replace advice given to you by your health care provider. Make sure you discuss any questions you have with your health care provider. Document Revised: 08/10/2020 Document Reviewed: 02/17/2020 Elsevier Patient Education  2022 [employer].

## 2021-04-17 LAB — CBC WITH DIFFERENTIAL/PLATELET
Basophils Absolute: 0 10*3/uL (ref 0.0–0.2)
Basos: 1 %
EOS (ABSOLUTE): 0.1 10*3/uL (ref 0.0–0.4)
Eos: 2 %
Hematocrit: 42.7 % (ref 37.5–51.0)
Hemoglobin: 14.8 g/dL (ref 13.0–17.7)
Immature Grans (Abs): 0 10*3/uL (ref 0.0–0.1)
Immature Granulocytes: 0 %
Lymphocytes Absolute: 1.9 10*3/uL (ref 0.7–3.1)
Lymphs: 35 %
MCH: 30.6 pg (ref 26.6–33.0)
MCHC: 34.7 g/dL (ref 31.5–35.7)
MCV: 88 fL (ref 79–97)
Monocytes Absolute: 0.4 10*3/uL (ref 0.1–0.9)
Monocytes: 8 %
Neutrophils Absolute: 3 10*3/uL (ref 1.4–7.0)
Neutrophils: 54 %
Platelets: 210 10*3/uL (ref 150–450)
RBC: 4.84 x10E6/uL (ref 4.14–5.80)
RDW: 12.4 % (ref 11.6–15.4)
WBC: 5.5 10*3/uL (ref 3.4–10.8)

## 2021-04-17 LAB — CMP14+EGFR
ALT: 35 IU/L (ref 0–44)
AST: 23 IU/L (ref 0–40)
Albumin/Globulin Ratio: 2.2 (ref 1.2–2.2)
Albumin: 4.6 g/dL (ref 3.8–4.9)
Alkaline Phosphatase: 68 IU/L (ref 44–121)
BUN/Creatinine Ratio: 14 (ref 9–20)
BUN: 18 mg/dL (ref 6–24)
Bilirubin Total: 0.4 mg/dL (ref 0.0–1.2)
CO2: 25 mmol/L (ref 20–29)
Calcium: 9.3 mg/dL (ref 8.7–10.2)
Chloride: 103 mmol/L (ref 96–106)
Creatinine, Ser: 1.25 mg/dL (ref 0.76–1.27)
Globulin, Total: 2.1 g/dL (ref 1.5–4.5)
Glucose: 115 mg/dL — ABNORMAL HIGH (ref 70–99)
Potassium: 5.1 mmol/L (ref 3.5–5.2)
Sodium: 140 mmol/L (ref 134–144)
Total Protein: 6.7 g/dL (ref 6.0–8.5)
eGFR: 66 mL/min/{1.73_m2} (ref >59)

## 2021-04-17 LAB — LIPID PANEL
Chol/HDL Ratio: 2.9 ratio (ref 0.0–5.0)
Cholesterol, Total: 111 mg/dL (ref 100–199)
HDL: 38 mg/dL — ABNORMAL LOW (ref >39)
LDL Chol Calc (NIH): 59 mg/dL (ref 0–99)
Triglycerides: 63 mg/dL (ref 0–149)
VLDL Cholesterol Cal: 14 mg/dL (ref 5–40)

## 2021-04-24 ENCOUNTER — Ambulatory Visit (INDEPENDENT_AMBULATORY_CARE_PROVIDER_SITE_OTHER): Payer: Medicare Other

## 2021-04-24 VITALS — Ht 68.0 in | Wt 216.0 lb

## 2021-04-24 DIAGNOSIS — Z0000 Encounter for general adult medical examination without abnormal findings: Secondary | ICD-10-CM

## 2021-04-24 NOTE — Progress Notes (Signed)
Subjective:   Andre Hunt is a 60 y.o. male who presents for Medicare Annual/Initial preventive examination.  Virtual Visit via Telephone Note  I connected with  Andre Hunt on 04/24/21 at  2:45 PM EST by telephone and verified that I am speaking with the correct person using two identifiers.  Location: Patient: Home Provider: [facility] Persons participating in the virtual visit: [name]   I discussed the limitations, risks, security and privacy concerns of performing an evaluation and management service by telephone and the availability of in person appointments. The patient expressed understanding and agreed to proceed.  Interactive audio and video telecommunications were attempted between this nurse and patient, however failed, due to patient having technical difficulties OR patient did not have access to video capability.  We continued and completed visit with audio only.  Some vital signs may be absent or patient reported.   Andre Delorenzo [name], LPN   Review of Systems     Cardiac Risk Factors include: advanced age (>52men, >45 women);dyslipidemia;hypertension;obesity (BMI >30kg/m2)     Objective:    Today's Vitals   04/24/21 1331  Weight: 216 lb (98 kg)  Height: 5\' 8"  (1.727 m)  PainSc: 5    Body mass index is 32.84 kg/m.  Advanced Directives 04/24/2021  Does Patient Have a Medical Advance Directive? No  Would patient like information on creating a medical advance directive? No - Patient declined    Current Medications (verified) Outpatient Encounter Medications as of 04/24/2021  Medication Sig   allopurinol (ZYLOPRIM) 300 MG tablet Take 1 tablet (300 mg total) by mouth daily.   atorvastatin (LIPITOR) 40 MG tablet Take 1 tablet (40 mg total) by mouth daily at 6 PM. (NEEDS TO BE SEEN BEFORE NEXT REFILL)   cetirizine (ZYRTEC) 10 MG tablet TAKE 1 TABLET DAILY   diazepam (VALIUM) 5 MG tablet Take 1 tablet (5 mg total) by mouth every 12 (twelve)  hours as needed for anxiety.   HYDROcodone-acetaminophen (NORCO/VICODIN) 5-325 MG tablet Take 1 tablet by mouth every 6 (six) hours as needed for moderate pain (Must last 30 days.).   hydrocortisone (ANUSOL-HC) 2.5 % rectal cream Place 1 application rectally 2 (two) times daily as needed for hemorrhoids or anal itching.   lisinopril (ZESTRIL) 10 MG tablet Take 1 tablet (10 mg total) by mouth daily. (NEEDS TO BE SEEN BEFORE NEXT REFILL)   naproxen (NAPROSYN) 500 MG tablet TAKE 1 TABLET ONCE DAILY WITH MEAL   promethazine (PHENERGAN) 12.5 MG tablet Take 1 tablet (12.5 mg total) by mouth every 8 (eight) hours as needed for nausea or vomiting.   colchicine 0.6 MG tablet One tid po for five days. (Patient not taking: Reported on 04/24/2021)   No facility-administered encounter medications on file as of 04/24/2021.    Allergies (verified) Iohexol   History: Past Medical History:  Diagnosis Date   Allergy    Arthritis    Gout    Hyperlipidemia    Hypertension    Personal history of colonic adenoma 09/28/2012   Past Surgical History:  Procedure Laterality Date   APPENDECTOMY  1988   CARPAL TUNNEL RELEASE     bilateral 2000 and 2001   COLONOSCOPY  09/23/2012   ELBOW SURGERY  2006   muscle repair   EXPLORATORY LAPAROTOMY  1996   after falling on scissors; repair of "hole in intestines"   [place]  Family History  Problem Relation Age of Onset   Kidney cancer Mother    Diabetes Mother    COPD Mother    Anuerysm Father    Diabetes Father    Heart disease Father    Diabetes Sister    Colon cancer Neg Hx    Colon polyps Neg Hx    Esophageal cancer Neg Hx    Rectal cancer Neg Hx    Stomach cancer Neg Hx    Social History   Socioeconomic History   Marital status: Married    Spouse name: Not on file   Number of children: Not on file   Years of education: Not on file   Highest education level: Not on file   Occupational History   Occupation: disability/ partial  Tobacco Use   Smoking status: Never   Smokeless tobacco: Current    Types: Snuff  Vaping Use   Vaping Use: Never used  Substance and Sexual Activity   Alcohol use: No   Drug use: No   Sexual activity: Not on file  Other Topics Concern   Not on file  Social History Narrative   Not on file   Social Determinants of Health   Financial Resource Strain: Low Risk    Difficulty of Paying Living Expenses: Not very hard  Food Insecurity: No Food Insecurity   Worried About Running Out of Food in the Last Year: Never true   Ran Out of Food in the Last Year: Never true  Transportation Needs: No Transportation Needs   Lack of Transportation (Medical): No   Lack of Transportation (Non-Medical): No  Physical Activity: Sufficiently Active   Days of Exercise per Week: 5 days   Minutes of Exercise per Session: 60 min  Stress: No Stress Concern Present   Feeling of Stress : Only a little  Social Connections: Moderately Isolated   Frequency of Communication with Friends and Family: More than three times a week   Frequency of Social Gatherings with Friends and Family: More than three times a week   Attends Religious Services: Never   Marine scientist or Organizations: No   Attends Music therapist: Never   Marital Status: Married    Tobacco Counseling Ready to quit: Not Answered Counseling given: Not Answered   Clinical Intake:  Pre-visit preparation completed: Yes  Pain : 0-10 Pain Score: 5  Pain Type: Chronic pain Pain Location: Knee Pain Orientation: Left Pain Descriptors / Indicators: Aching Pain Onset: More than a month ago Pain Frequency: Intermittent     BMI - recorded: 32.84 Nutritional Status: BMI > 30  Obese Nutritional Risks: None Diabetes: No  How often do you need to have someone help you when you read instructions, pamphlets, or other written materials from your doctor or pharmacy?:  1 - Never  Diabetic? no  Interpreter Needed?: No  Information entered by :: [name], LPN   Activities of Daily Living In your present state of health, do you have any difficulty performing the following activities: 04/24/2021  Hearing? N  Vision? N  Difficulty concentrating or making decisions? N  Walking or climbing stairs? N  Dressing or bathing? N  Doing errands, shopping? N  Preparing Food and eating ? N  Using the Toilet? N  In the past six months, have you accidently leaked urine? N  Do you have problems with loss of bowel control? N  Managing your Medications? N  Managing your Finances? N  Housekeeping or managing your  Housekeeping? N  Some recent data might be hidden    Patient Care Team: Chevis Pretty, FNP as PCP - General (Nurse Practitioner) Sanjuana Kava, MD as Attending Physician (Orthopedic Surgery) Gatha Mayer, MD as Consulting Physician (Gastroenterology)  Indicate any recent Medical Services you may have received from other than [facility] providers in the past year (date may be approximate).     Assessment:   This is a routine wellness examination for Icker.  Hearing/Vision screen Hearing Screening - Comments:: Denies hearing difficulties  Vision Screening - Comments:: Denies vision difficulties - no eye doctor  Dietary issues and exercise activities discussed: Current Exercise Habits: Home exercise routine, Type of exercise: walking;Other - see comments (yard work, odds and ends jobs), Time (Minutes): 60, Frequency (Times/Week): 5, Weekly Exercise (Minutes/Week): 300, Intensity: Moderate, Exercise limited by: orthopedic condition(s)   Goals Addressed             This Visit's Progress    Achieve a Healthy Weight-Obesity       Timeframe:  Long-Range Goal Priority:  Medium Start Date: 04/24/2021                        Expected End Date: 04/25/2022                      Follow Up Date 04/25/2022    - drink 6 to 8 glasses of water each  day - eat 3 to 5 servings of fruits and vegetables each day - eat fish at least once per week - fill half the plate with nonstarchy vegetables - limit fast food meals to no more than 1 per week - manage portion size - set a realistic goal - set goal weight    Why is this important?   When you are ready to manage your weight, have a plan and have set a goal, it is time to take action.  Taking small steps to change how you eat and exercise is a good place to start.    Notes: Aim for weight of 195 by this time next year       Depression Screen PHQ 2/9 Scores 04/24/2021 04/16/2021 09/14/2020 03/13/2020 04/18/2019 06/21/2018 04/02/2018  PHQ - 2 Score 0 0 0 0 0 0 0  PHQ- 9 Score 0 0 - - - - -    Fall Risk Fall Risk  04/24/2021 04/16/2021 09/14/2020 03/13/2020 04/18/2019  Falls in the past year? 0 0 0 0 0  Number falls in past yr: 0 - - - -  Injury with Fall? 0 - - - -  Risk for fall due to : Medication side effect;Orthopedic patient - - - -  Follow up Falls prevention discussed - - - -    FALL RISK PREVENTION PERTAINING TO THE HOME:  Any stairs in or around the home? No  If so, are there any without handrails? No  Home free of loose throw rugs in walkways, pet beds, electrical cords, etc? Yes  Adequate lighting in your home to reduce risk of falls? Yes   ASSISTIVE DEVICES UTILIZED TO PREVENT FALLS:  Life alert? No  Use of a cane, walker or w/c? No  Grab bars in the bathroom? No  Shower chair or bench in shower? Yes  Elevated toilet seat or a handicapped toilet? Yes   TIMED UP AND GO:  Was the test performed? No . Telephonic visit  Cognitive Function: Normal cognitive status assessed by direct  observation by this Nurse Health Advisor. No abnormalities found.       6CIT Screen 04/24/2021  What Year? 0 points  What month? 0 points  What time? 0 points  Count back from 20 0 points  Months in reverse 0 points  Repeat phrase 2 points  Total Score 2    Immunizations Immunization  History  Administered Date(s) Administered   Moderna Sars-Covid-2 Vaccination 10/31/2019, 12/05/2019, 07/10/2020, 12/04/2020   Zoster Recombinat (Shingrix) 10/01/2020, 12/28/2020    TDAP status: Due, Education has been provided regarding the importance of this vaccine. Advised may receive this vaccine at local pharmacy or Health Dept. Aware to provide a copy of the vaccination record if obtained from local pharmacy or Health Dept. Verbalized acceptance and understanding.  Flu Vaccine status: Declined, Education has been provided regarding the importance of this vaccine but patient still declined. Advised may receive this vaccine at local pharmacy or Health Dept. Aware to provide a copy of the vaccination record if obtained from local pharmacy or Health Dept. Verbalized acceptance and understanding.  Pneumococcal vaccine status: Declined,  Education has been provided regarding the importance of this vaccine but patient still declined. Advised may receive this vaccine at local pharmacy or Health Dept. Aware to provide a copy of the vaccination record if obtained from local pharmacy or Health Dept. Verbalized acceptance and understanding.   Covid-19 vaccine status: Completed vaccines  Qualifies for Shingles Vaccine? Yes   Zostavax completed Yes   Shingrix Completed?: Yes  Screening Tests Health Maintenance  Topic Date Due   COVID-19 Vaccine (5 - Booster for Moderna series) 05/02/2021 (Originally 01/29/2021)   INFLUENZA VACCINE  09/13/2021 (Originally 01/14/2021)   TETANUS/TDAP  04/16/2022 (Originally [birth year]06/1980)   HIV Screening  04/16/2022 (Originally 07/06/1975)   COLONOSCOPY (Pts 45-58yrs Insurance coverage will need to be confirmed)  08/23/2022   Hepatitis C Screening  Completed   Zoster Vaccines- Shingrix  Completed   Pneumococcal Vaccine 70-19 Years old  Aged Out   HPV VACCINES  Aged Out    Health Maintenance  There are no preventive care reminders to display for this  patient.  Colorectal cancer screening: Type of screening: Colonoscopy. Completed 08/23/2019. Repeat every 3 years  Lung Cancer Screening: (Low Dose CT Chest recommended if Age 16-80 years, 30 pack-year currently smoking OR have quit w/in 15years.) does not qualify.   Additional Screening:  Hepatitis C Screening: does qualify; Completed 03/11/2016  Vision Screening: Recommended annual ophthalmology exams for early detection of glaucoma and other disorders of the eye. Is the patient up to date with their annual eye exam?  No  Who is the provider or what is the name of the office in which the patient attends annual eye exams? none If pt is not established with a provider, would they like to be referred to a provider to establish care? No .   Dental Screening: Recommended annual dental exams for proper oral hygiene  Community Resource Referral / Chronic Care Management: CRR required this visit?  No   CCM required this visit?  No      Plan:     I have personally reviewed and noted the following in the patient's chart:   Medical and social history Use of alcohol, tobacco or illicit drugs  Current medications and supplements including opioid prescriptions. Patient is currently taking opioid prescriptions. Information provided to patient regarding non-opioid alternatives. Patient advised to discuss non-opioid treatment plan with their provider. Functional ability and status Nutritional status Physical activity Advanced directives  List of other physicians Hospitalizations, surgeries, and ER visits in previous 12 months Vitals Screenings to include cognitive, depression, and falls Referrals and appointments  In addition, I have reviewed and discussed with patient certain preventive protocols, quality metrics, and best practice recommendations. A written personalized care plan for preventive services as well as general preventive health recommendations were provided to patient.      Sandrea Hammond, LPN   07/19/3433   Nurse Notes: None

## 2021-04-24 NOTE — Patient Instructions (Signed)
Andre Hunt , Thank you for taking time to come for your Medicare Wellness Visit. I appreciate your ongoing commitment to your health goals. Please review the following plan we discussed and let me know if I can assist you in the future.   Screening recommendations/referrals: Colonoscopy: Done 08/23/2019 - Repeat every 3 years  Recommended yearly ophthalmology/optometry visit for glaucoma screening and checkup Recommended yearly dental visit for hygiene and checkup  Vaccinations: Influenza vaccine: Declined Pneumococcal vaccine: Declined Tdap vaccine: Declined Shingles vaccine: Done 10/01/2020 & 12/28/2020   Covid-19: Done 10/31/2019, 12/05/2019, 07/10/2020, & 12/04/2020  Advanced directives: Advance directive discussed with you today. Even though you declined this today, please call our office should you change your mind, and we can give you the proper paperwork for you to fill out.   Conditions/risks identified: Aim for 30 minutes of exercise or brisk walking each day, drink 6-8 glasses of water and eat lots of fruits and vegetables.   Next appointment: Follow up in one year for your annual wellness visit   Preventive Care 40-64 Years, Male Preventive care refers to lifestyle choices and visits with your health care provider that can promote health and wellness. What does preventive care include? A yearly physical exam. This is also called an annual well check. Dental exams once or twice a year. Routine eye exams. Ask your health care provider how often you should have your eyes checked. Personal lifestyle choices, including: Daily care of your teeth and gums. Regular physical activity. Eating a healthy diet. Avoiding tobacco and drug use. Limiting alcohol use. Practicing safe sex. Taking low-dose aspirin every day starting at age 9. What happens during an annual well check? The services and screenings done by your health care provider during your annual well check will depend on your  age, overall health, lifestyle risk factors, and family history of disease. Counseling  Your health care provider may ask you questions about your: Alcohol use. Tobacco use. Drug use. Emotional well-being. Home and relationship well-being. Sexual activity. Eating habits. Work and work Statistician. Screening  You may have the following tests or measurements: Height, weight, and BMI. Blood pressure. Lipid and cholesterol levels. These may be checked every 5 years, or more frequently if you are over 28 years old. Skin check. Lung cancer screening. You may have this screening every year starting at age 34 if you have a 30-pack-year history of smoking and currently smoke or have quit within the past 15 years. Fecal occult blood test (FOBT) of the stool. You may have this test every year starting at age 44. Flexible sigmoidoscopy or colonoscopy. You may have a sigmoidoscopy every 5 years or a colonoscopy every 10 years starting at age 44. Prostate cancer screening. Recommendations will vary depending on your family history and other risks. Hepatitis C blood test. Hepatitis B blood test. Sexually transmitted disease (STD) testing. Diabetes screening. This is done by checking your blood sugar (glucose) after you have not eaten for a while (fasting). You may have this done every 1-3 years. Discuss your test results, treatment options, and if necessary, the need for more tests with your health care provider. Vaccines  Your health care provider may recommend certain vaccines, such as: Influenza vaccine. This is recommended every year. Tetanus, diphtheria, and acellular pertussis (Tdap, Td) vaccine. You may need a Td booster every 10 years. Zoster vaccine. You may need this after age 88. Pneumococcal 13-valent conjugate (PCV13) vaccine. You may need this if you have certain conditions and have not  been vaccinated. Pneumococcal polysaccharide (PPSV23) vaccine. You may need one or two doses if you  smoke cigarettes or if you have certain conditions. Talk to your health care provider about which screenings and vaccines you need and how often you need them. This information is not intended to replace advice given to you by your health care provider. Make sure you discuss any questions you have with your health care provider. Document Released: 06/29/2015 Document Revised: 02/20/2016 Document Reviewed: 04/03/2015 Elsevier Interactive Patient Education  2017 [place] Prevention in the Home Falls can cause injuries. They can happen to people of all ages. There are many things you can do to make your home safe and to help prevent falls. What can I do on the outside of my home? Regularly fix the edges of walkways and driveways and fix any cracks. Remove anything that might make you trip as you walk through a door, such as a raised step or threshold. Trim any bushes or trees on the path to your home. Use bright outdoor lighting. Clear any walking paths of anything that might make someone trip, such as rocks or tools. Regularly check to see if handrails are loose or broken. Make sure that both sides of any steps have handrails. Any raised decks and porches should have guardrails on the edges. Have any leaves, snow, or ice cleared regularly. Use sand or salt on walking paths during winter. Clean up any spills in your garage right away. This includes oil or grease spills. What can I do in the bathroom? Use night lights. Install grab bars by the toilet and in the tub and shower. Do not use towel bars as grab bars. Use non-skid mats or decals in the tub or shower. If you need to sit down in the shower, use a plastic, non-slip stool. Keep the floor dry. Clean up any water that spills on the floor as soon as it happens. Remove soap buildup in the tub or shower regularly. Attach bath mats securely with double-sided non-slip rug tape. Do not have throw rugs and other things on the floor  that can make you trip. What can I do in the bedroom? Use night lights. Make sure that you have a light by your bed that is easy to reach. Do not use any sheets or blankets that are too big for your bed. They should not hang down onto the floor. Have a firm chair that has side arms. You can use this for support while you get dressed. Do not have throw rugs and other things on the floor that can make you trip. What can I do in the kitchen? Clean up any spills right away. Avoid walking on wet floors. Keep items that you use a lot in easy-to-reach places. If you need to reach something above you, use a strong step stool that has a grab bar. Keep electrical cords out of the way. Do not use floor polish or wax that makes floors slippery. If you must use wax, use non-skid floor wax. Do not have throw rugs and other things on the floor that can make you trip. What can I do with my stairs? Do not leave any items on the stairs. Make sure that there are handrails on both sides of the stairs and use them. Fix handrails that are broken or loose. Make sure that handrails are as long as the stairways. Check any carpeting to make sure that it is firmly attached to the stairs. Fix any  carpet that is loose or worn. Avoid having throw rugs at the top or bottom of the stairs. If you do have throw rugs, attach them to the floor with carpet tape. Make sure that you have a light switch at the top of the stairs and the bottom of the stairs. If you do not have them, ask someone to add them for you. What else can I do to help prevent falls? Wear shoes that: Do not have high heels. Have rubber bottoms. Are comfortable and fit you well. Are closed at the toe. Do not wear sandals. If you use a stepladder: Make sure that it is fully opened. Do not climb a closed stepladder. Make sure that both sides of the stepladder are locked into place. Ask someone to hold it for you, if possible. Clearly mark and make sure  that you can see: Any grab bars or handrails. First and last steps. Where the edge of each step is. Use tools that help you move around (mobility aids) if they are needed. These include: Canes. Walkers. Scooters. Crutches. Turn on the lights when you go into a dark area. Replace any light bulbs as soon as they burn out. Set up your furniture so you have a clear path. Avoid moving your furniture around. If any of your floors are uneven, fix them. If there are any pets around you, be aware of where they are. Review your medicines with your doctor. Some medicines can make you feel dizzy. This can increase your chance of falling. Ask your doctor what other things that you can do to help prevent falls. This information is not intended to replace advice given to you by your health care provider. Make sure you discuss any questions you have with your health care provider. Document Released: 03/29/2009 Document Revised: 11/08/2015 Document Reviewed: 07/07/2014 Elsevier Interactive Patient Education  2017 [employer]   Managing Pain Without Opioids Opioids are strong medicines used to treat moderate to severe pain. For some people, especially those who have long-term (chronic) pain, opioids may not be the best choice for pain management due to: Side effects like nausea, constipation, and sleepiness. The risk of addiction (opioid use disorder). The longer you take opioids, the greater your risk of addiction. Pain that lasts for more than 3 months is called chronic pain. Managing chronic pain usually requires more than one approach and is often provided by a team of health care providers working together (multidisciplinary approach). Pain management may be done at a pain management center or pain clinic. How to manage pain without the use of opioids Use non-opioid medicines Non-opioid medicines for pain may include: Over-the-counter or prescription non-steroidal anti-inflammatory drugs (NSAIDs).  These may be the first medicines used for pain. They work well for muscle and bone pain, and they reduce swelling. Acetaminophen. This over-the-counter medicine may work well for milder pain but not swelling. Antidepressants. These may be used to treat chronic pain. A certain type of antidepressant (tricyclics) is often used. These medicines are given in lower doses for pain than when used for depression. Anticonvulsants. These are usually used to treat seizures but may also reduce nerve (neuropathic) pain. Muscle relaxants. These relieve pain caused by sudden muscle tightening (spasms). You may also use a pain medicine that is applied to the skin as a patch, cream, or gel (topical analgesic), such as a numbing medicine. These may cause fewer side effects than medicines taken by mouth. Do certain therapies as directed Some therapies can help with  pain management. They include: Physical therapy. You will do exercises to gain strength and flexibility. A physical therapist may teach you exercises to move and stretch parts of your body that are weak, stiff, or painful. You can learn these exercises at physical therapy visits and practice them at home. Physical therapy may also involve: Massage. Heat wraps or applying heat or cold to affected areas. Electrical signals that interrupt pain signals (transcutaneous electrical nerve stimulation, TENS). Weak lasers that reduce pain and swelling (low-level laser therapy). Signals from your body that help you learn to regulate pain (biofeedback). Occupational therapy. This helps you to learn ways to function at home and work with less pain. Recreational therapy. This involves trying new activities or hobbies, such as a physical activity or drawing. Mental health therapy, including: Cognitive behavioral therapy (CBT). This helps you learn coping skills for dealing with pain. Acceptance and commitment therapy (ACT) to change the way you think and react to  pain. Relaxation therapies, including muscle relaxation exercises and mindfulness-based stress reduction. Pain management counseling. This may be individual, family, or group counseling.  Receive medical treatments Medical treatments for pain management include: Nerve block injections. These may include a pain blocker and anti-inflammatory medicines. You may have injections: Near the spine to relieve chronic back or neck pain. Into joints to relieve back or joint pain. Into nerve areas that supply a painful area to relieve body pain. Into muscles (trigger point injections) to relieve some painful muscle conditions. A medical device placed near your spine to help block pain signals and relieve nerve pain or chronic back pain (spinal cord stimulation device). Acupuncture. Follow these instructions at home Medicines Take over-the-counter and prescription medicines only as told by your health care provider. If you are taking pain medicine, ask your health care providers about possible side effects to watch out for. Do not drive or use heavy machinery while taking prescription opioid pain medicine. Lifestyle  Do not use drugs or alcohol to reduce pain. If you drink alcohol, limit how much you have to: 0-1 drink a day for women who are not pregnant. 0-2 drinks a day for men. Know how much alcohol is in a drink. In the [place] one drink equals one 12 oz bottle of beer (355 mL), one 5 oz glass of wine (148 mL), or one 1 oz glass of hard liquor (44 mL). Do not use any products that contain nicotine or tobacco. These products include cigarettes, chewing tobacco, and vaping devices, such as e-cigarettes. If you need help quitting, ask your health care provider. Eat a healthy diet and maintain a healthy weight. Poor diet and excess weight may make pain worse. Eat foods that are high in fiber. These include fresh fruits and vegetables, whole grains, and beans. Limit foods that are high in fat and  processed sugars, such as fried and sweet foods. Exercise regularly. Exercise lowers stress and may help relieve pain. Ask your health care provider what activities and exercises are safe for you. If your health care provider approves, join an exercise class that combines movement and stress reduction. Examples include yoga and tai chi. Get enough sleep. Lack of sleep may make pain worse. Lower stress as much as possible. Practice stress reduction techniques as told by your therapist. General instructions Work with all your pain management providers to find the treatments that work best for you. You are an important member of your pain management team. There are many things you can do to reduce pain on  your own. Consider joining an online or in-person support group for people who have chronic pain. Keep all follow-up visits. This is important. Where to find more information You can find more information about managing pain without opioids from: American Academy of Pain Medicine: painmed.[place] for Chronic Pain: instituteforchronicpain.org American Chronic Pain Association: theacpa.org Contact a health care provider if: You have side effects from pain medicine. Your pain gets worse or does not get better with treatments or home therapy. You are struggling with anxiety or depression. Summary Many types of pain can be managed without opioids. Chronic pain may respond better to pain management without opioids. Pain is best managed when you and a team of health care providers work together. Pain management without opioids may include non-opioid medicines, medical treatments, physical therapy, mental health therapy, and lifestyle changes. Tell your health care providers if your pain gets worse or is not being managed well enough. This information is not intended to replace advice given to you by your health care provider. Make sure you discuss any questions you have with your health care  provider. Document Revised: 09/12/2020 Document Reviewed: 09/12/2020 Elsevier Patient Education  [place].

## 2021-05-02 ENCOUNTER — Telehealth: Payer: Self-pay | Admitting: Orthopaedic Surgery

## 2021-05-02 MED ORDER — HYDROCODONE-ACETAMINOPHEN 5-325 MG PO TABS: 1.0000 | ORAL_TABLET | Freq: Four times a day (QID) | ORAL | 0 refills | Status: DC | PRN

## 2021-05-02 NOTE — Telephone Encounter (Signed)
Patient called for refill: HYDROcodone-acetaminophen (NORCO/VICODIN) 5-325 MG tablet 110 tablet      [employer]

## 2021-05-21 ENCOUNTER — Other Ambulatory Visit: Payer: Self-pay | Admitting: Nurse Practitioner

## 2021-05-21 DIAGNOSIS — I10 Essential (primary) hypertension: Secondary | ICD-10-CM

## 2021-05-30 ENCOUNTER — Encounter: Payer: Self-pay | Admitting: Orthopaedic Surgery

## 2021-05-30 ENCOUNTER — Other Ambulatory Visit: Payer: Self-pay

## 2021-05-30 ENCOUNTER — Ambulatory Visit: Payer: Medicare Other | Admitting: Orthopaedic Surgery

## 2021-05-30 VITALS — BP 150/83 | Ht 68.0 in | Wt 212.0 lb

## 2021-05-30 DIAGNOSIS — M7711 Lateral epicondylitis, right elbow: Secondary | ICD-10-CM | POA: Diagnosis not present

## 2021-05-30 DIAGNOSIS — M25511 Pain in right shoulder: Secondary | ICD-10-CM

## 2021-05-30 DIAGNOSIS — G8929 Other chronic pain: Secondary | ICD-10-CM | POA: Diagnosis not present

## 2021-05-30 MED ORDER — HYDROCODONE-ACETAMINOPHEN 5-325 MG PO TABS: 1.0000 | ORAL_TABLET | Freq: Four times a day (QID) | ORAL | 0 refills | Status: AC | PRN

## 2021-05-30 NOTE — Progress Notes (Signed)
The cold weather makes me hurt more.   He has chronic pain of the right elbow and right shoulder.  He has had more pain over the last few weeks with the cold weather and all the rain over the last few days.  He has no new trauma, no weakness.  He is taking his medicine and doing his exercises.  ROM of the elbow is full but tender over the lateral epicondyle.  The Right shoulder has full ROM but pain in the extremes.  NV intact.  Encounter Diagnoses  Name Primary?   Lateral epicondylitis, right elbow Yes   Chronic right shoulder pain    I have reviewed the [place] web site prior to prescribing narcotic medicine for this patient.  Return in three months.  Call if any problem.  Precautions discussed.  Electronically Signed Sanjuana Kava, MD 12/15/20228:38 AM

## 2021-06-12 ENCOUNTER — Other Ambulatory Visit: Payer: Self-pay | Admitting: Orthopaedic Surgery

## 2021-07-03 ENCOUNTER — Telehealth: Payer: Self-pay | Admitting: Radiology

## 2021-07-03 MED ORDER — HYDROCODONE-ACETAMINOPHEN 5-325 MG PO TABS
1.0000 | ORAL_TABLET | Freq: Four times a day (QID) | ORAL | 0 refills | Status: AC | PRN
Start: 2021-07-03 — End: 2021-08-02

## 2021-07-03 NOTE — Telephone Encounter (Signed)
Patient called requested refill pain meds, not listed as current in chart.  Hydrocodone. [employer].

## 2021-07-26 ENCOUNTER — Other Ambulatory Visit: Payer: Self-pay | Admitting: *Deleted

## 2021-07-26 DIAGNOSIS — I10 Essential (primary) hypertension: Secondary | ICD-10-CM

## 2021-07-26 MED ORDER — LISINOPRIL 10 MG PO TABS: 10.0000 mg | ORAL_TABLET | Freq: Every day | ORAL | 0 refills | Status: DC

## 2021-08-05 ENCOUNTER — Telehealth: Payer: Self-pay

## 2021-08-05 MED ORDER — HYDROCODONE-ACETAMINOPHEN 5-325 MG PO TABS
1.0000 | ORAL_TABLET | Freq: Four times a day (QID) | ORAL | 0 refills | Status: DC | PRN
Start: 2021-08-05 — End: 2021-09-05

## 2021-08-05 NOTE — Telephone Encounter (Signed)
Hydrocodone-Acetaminophen 5/325 mg  Qty 110 Tablets  PATIENT USES [facility]

## 2021-08-27 ENCOUNTER — Other Ambulatory Visit: Payer: Self-pay

## 2021-08-27 ENCOUNTER — Ambulatory Visit: Payer: Medicare Other | Admitting: Orthopaedic Surgery

## 2021-08-27 ENCOUNTER — Encounter: Payer: Self-pay | Admitting: Orthopaedic Surgery

## 2021-08-27 VITALS — BP 135/81 | HR 63 | Ht 68.0 in | Wt 218.0 lb

## 2021-08-27 DIAGNOSIS — M25511 Pain in right shoulder: Secondary | ICD-10-CM

## 2021-08-27 DIAGNOSIS — M7711 Lateral epicondylitis, right elbow: Secondary | ICD-10-CM | POA: Diagnosis not present

## 2021-08-27 DIAGNOSIS — G8929 Other chronic pain: Secondary | ICD-10-CM

## 2021-08-27 NOTE — Progress Notes (Signed)
My shoulder is a little better. ? ?He has chronic pain of the right shoulder and right lateral epicondyle.  His shoulder has has less pain with our warmer weather.  He has no new trauma, he has no swelling, no numbness.  He is taking his medicine. ? ?Right shoulder has full ROM with pain in the extremes.  Right lateral epicondyle is tender but no redness or swelling. ? ?Encounter Diagnoses  ?Name Primary?  ? Chronic right shoulder pain Yes  ? Lateral epicondylitis, right elbow   ? ?Return in three months. ? ?Continue present medicines. ? ?Call if any problem. ? ?Precautions discussed. ? ?Electronically Signed ?Sanjuana Kava, MD ?3/14/20238:20 AM ? ?

## 2021-09-04 ENCOUNTER — Telehealth: Payer: Self-pay | Admitting: Orthopaedic Surgery

## 2021-09-04 NOTE — Telephone Encounter (Signed)
Patient requests refill: ?HYDROcodone-acetaminophen (NORCO/VICODIN) 5-325 MG tablet 110 tablet  ?             [employer] ?

## 2021-09-05 MED ORDER — HYDROCODONE-ACETAMINOPHEN 5-325 MG PO TABS: 1.0000 | ORAL_TABLET | Freq: Four times a day (QID) | ORAL | 0 refills | Status: DC | PRN

## 2021-10-07 ENCOUNTER — Telehealth: Payer: Self-pay | Admitting: Orthopaedic Surgery

## 2021-10-14 ENCOUNTER — Encounter: Payer: Self-pay | Admitting: Nurse Practitioner

## 2021-10-14 ENCOUNTER — Ambulatory Visit (INDEPENDENT_AMBULATORY_CARE_PROVIDER_SITE_OTHER): Payer: Medicare Other | Admitting: Nurse Practitioner

## 2021-10-14 ENCOUNTER — Other Ambulatory Visit: Payer: Self-pay | Admitting: Nurse Practitioner

## 2021-10-14 VITALS — BP 113/74 | HR 66 | Temp 98.1°F | Resp 20 | Ht 68.0 in | Wt 226.0 lb

## 2021-10-14 DIAGNOSIS — I10 Essential (primary) hypertension: Secondary | ICD-10-CM

## 2021-10-14 DIAGNOSIS — E785 Hyperlipidemia, unspecified: Secondary | ICD-10-CM | POA: Diagnosis not present

## 2021-10-14 DIAGNOSIS — Z125 Encounter for screening for malignant neoplasm of prostate: Secondary | ICD-10-CM

## 2021-10-14 DIAGNOSIS — Z6834 Body mass index (BMI) 34.0-34.9, adult: Secondary | ICD-10-CM

## 2021-10-14 DIAGNOSIS — F411 Generalized anxiety disorder: Secondary | ICD-10-CM

## 2021-10-14 DIAGNOSIS — Z79891 Long term (current) use of opiate analgesic: Secondary | ICD-10-CM | POA: Diagnosis not present

## 2021-10-14 DIAGNOSIS — M1A00X0 Idiopathic chronic gout, unspecified site, without tophus (tophi): Secondary | ICD-10-CM

## 2021-10-14 MED ORDER — LISINOPRIL 10 MG PO TABS: 10.0000 mg | ORAL_TABLET | Freq: Every day | ORAL | 1 refills | Status: DC

## 2021-10-14 MED ORDER — DIAZEPAM 5 MG PO TABS: 5.0000 mg | ORAL_TABLET | Freq: Two times a day (BID) | ORAL | 5 refills | Status: DC | PRN

## 2021-10-14 MED ORDER — ATORVASTATIN CALCIUM 40 MG PO TABS: 40.0000 mg | ORAL_TABLET | Freq: Every day | ORAL | 1 refills | Status: DC

## 2021-10-14 MED ORDER — ALLOPURINOL 300 MG PO TABS: 300.0000 mg | ORAL_TABLET | Freq: Every day | ORAL | 1 refills | Status: DC

## 2021-10-14 NOTE — Patient Instructions (Signed)

## 2021-10-14 NOTE — Addendum Note (Signed)
Addended by: Rolena Infante on: 10/14/2021 11:24 AM ? ? Modules accepted: Orders ? ?

## 2021-10-14 NOTE — Progress Notes (Signed)
? ?Subjective:  ? ? Patient ID: Andre Hunt, male    DOB: 06/18/1960, 61 y.o.   MRN: [id number] ? ? ?Chief Complaint: medical management of chronic issues  ?  ? ?HPI: ? ?Andre Hunt is a 61 y.o. who identifies as a male who was assigned male at birth.  ? ?Social history: ?Lives with: wife ?Work history: on disability ? ? ?Comes in today for follow up of the following chronic medical issues: ? ?1. Essential hypertension ?No c/o chest pain, sob or headache. Does not check blood pressure at home. ?BP Readings from Last 3 Encounters:  ?08/27/21 135/81  ?05/30/21 (!) 150/83  ?04/16/21 114/77  ? ? ? ?2. Hyperlipidemia with target LDL less than 100 ?Does not watch diet and does very little exercise. ?Lab Results  ?Component Value Date  ? CHOL 111 04/16/2021  ? HDL 38 (L) 04/16/2021  ? [place] 59 04/16/2021  ? TRIG 63 04/16/2021  ? CHOLHDL 2.9 04/16/2021  ? ?The ASCVD Risk score (Arnett DK, et al., 2019) failed to calculate for the following reasons: ?  The valid total cholesterol range is 130 to 320 mg/dL ? ? ?3. Idiopathic chronic gout without tophus, unspecified site ?No recent gout flare ups ? ?4. GAD (generalized anxiety disorder) ?Is on valium BID and is doing well ? ?  10/14/2021  ? 10:55 AM 04/16/2021  ? 10:49 AM 03/13/2020  ?  9:41 AM 07/21/2019  ? 12:08 PM  ?GAD 7 : Generalized Anxiety Score  ?Nervous, Anxious, on Edge 0 0 3 1  ?Control/stop worrying 0 0 3 2  ?Worry too much - different things 0 0 3 2  ?Trouble relaxing 0 0 3 2  ?Restless 0 0 0 2  ?Easily annoyed or irritable 0 0 1 2  ?Afraid - awful might happen 0 0 1 0  ?Total GAD 7 Score 0 0 14 11  ?Anxiety Difficulty Not difficult at all Not difficult at all Not difficult at all Somewhat difficult  ? ?Contract signed 10/14/21 ?Last toxassure done today ? ? ?5. BMI 34.0-34.9,adult ?Weight is up 8 lbs ?Wt Readings from Last 3 Encounters:  ?10/14/21 226 lb (102.5 kg)  ?08/27/21 218 lb (98.9 kg)  ?05/30/21 212 lb (96.2 kg)  ? ?BMI Readings from Last 3 Encounters:   ?10/14/21 34.36 kg/m?  ?08/27/21 33.15 kg/m?  ?05/30/21 32.23 kg/m?  ? ? ? ? ?New complaints: ?None today ? ?Allergies  ?Allergen Reactions  ? Iohexol Anaphylaxis  ?  Allergy to IVP dye  ? ?Outpatient Encounter Medications as of 10/14/2021  ?Medication Sig  ? allopurinol (ZYLOPRIM) 300 MG tablet Take 1 tablet (300 mg total) by mouth daily.  ? atorvastatin (LIPITOR) 40 MG tablet Take 1 tablet (40 mg total) by mouth daily at 6 PM. (NEEDS TO BE SEEN BEFORE NEXT REFILL)  ? cetirizine (ZYRTEC) 10 MG tablet TAKE 1 TABLET DAILY  ? colchicine 0.6 MG tablet One tid po for five days.  ? diazepam (VALIUM) 5 MG tablet Take 1 tablet (5 mg total) by mouth every 12 (twelve) hours as needed for anxiety.  ? HYDROcodone-acetaminophen (NORCO/VICODIN) 5-325 MG tablet TAKE ONE TABLET EVERY 6 HOURS AS NEEDED FOR MODERATE PAIN  ? hydrocortisone (ANUSOL-HC) 2.5 % rectal cream Place 1 application rectally 2 (two) times daily as needed for hemorrhoids or anal itching.  ? lisinopril (ZESTRIL) 10 MG tablet Take 1 tablet (10 mg total) by mouth daily.  ? naproxen (NAPROSYN) 500 MG tablet TAKE 1 TABLET ONCE DAILY WITH  MEAL  ? promethazine (PHENERGAN) 12.5 MG tablet Take 1 tablet (12.5 mg total) by mouth every 8 (eight) hours as needed for nausea or vomiting.  ? ?No facility-administered encounter medications on file as of 10/14/2021.  ? ? ?Past Surgical History:  ?Procedure Laterality Date  ? APPENDECTOMY  1988  ? CARPAL TUNNEL RELEASE    ? bilateral 2000 and 2001  ? COLONOSCOPY  09/23/2012  ? ELBOW SURGERY  2006  ? muscle repair  ? EXPLORATORY LAPAROTOMY  1996  ? after falling on scissors; repair of "hole in intestines"  ? [place]  ? POLYPECTOMY    ? [place]  ? ? ?Family History  ?Problem Relation Age of Onset  ? Kidney cancer Mother   ? Diabetes Mother   ? COPD Mother   ? Anuerysm Father   ? Diabetes Father   ? Heart disease Father   ? Diabetes Sister   ? Colon cancer Neg Hx   ? Colon polyps Neg Hx   ?  Esophageal cancer Neg Hx   ? Rectal cancer Neg Hx   ? Stomach cancer Neg Hx   ? ? ? ? ? ?Review of Systems  ?Constitutional:  Negative for diaphoresis.  ?Eyes:  Negative for pain.  ?Respiratory:  Negative for shortness of breath.   ?Cardiovascular:  Negative for chest pain, palpitations and leg swelling.  ?Gastrointestinal:  Negative for abdominal pain.  ?Endocrine: Negative for polydipsia.  ?Skin:  Negative for rash.  ?Neurological:  Negative for dizziness, weakness and headaches.  ?Hematological:  Does not bruise/bleed easily.  ?All other systems reviewed and are negative. ? ?   ?Objective:  ? Physical Exam ?Vitals and nursing note reviewed.  ?Constitutional:   ?   Appearance: Normal appearance. He is well-developed.  ?HENT:  ?   Head: Normocephalic.  ?   Nose: Nose normal.  ?   Mouth/Throat:  ?   Mouth: Mucous membranes are moist.  ?   Pharynx: Oropharynx is clear.  ?Eyes:  ?   Pupils: Pupils are equal, round, and reactive to light.  ?Neck:  ?   Thyroid: No thyroid mass or thyromegaly.  ?   Vascular: No carotid bruit or JVD.  ?   Trachea: Phonation normal.  ?Cardiovascular:  ?   Rate and Rhythm: Normal rate and regular rhythm.  ?Pulmonary:  ?   Effort: Pulmonary effort is normal. No respiratory distress.  ?   Breath sounds: Normal breath sounds.  ?Abdominal:  ?   General: Bowel sounds are normal.  ?   Palpations: Abdomen is soft.  ?   Tenderness: There is no abdominal tenderness.  ?Musculoskeletal:     ?   General: Normal range of motion.  ?   Cervical back: Normal range of motion and neck supple.  ?Lymphadenopathy:  ?   Cervical: No cervical adenopathy.  ?Skin: ?   General: Skin is warm and dry.  ?Neurological:  ?   Mental Status: He is alert and oriented to person, place, and time.  ?Psychiatric:     ?   Behavior: Behavior normal.     ?   Thought Content: Thought content normal.     ?   Judgment: Judgment normal.  ? ? ?BP 113/74   Pulse 66   Temp 98.1 ?F (36.7 ?C) (Temporal)   Resp 20   Ht _0  (1.727 m)    Wt 226 lb (102.5 kg)   SpO2 94%   BMI  34.36 kg/m?  ? ? ? ?   ?Assessment & Plan:  ? ?Andre Hunt comes in today with chief complaint of Medical Management of Chronic Issues ? ? ?Diagnosis and orders addressed: ? ?1. Essential hypertension ?Low sodium diet ?- lisinopril (ZESTRIL) 10 MG tablet; Take 1 tablet (10 mg total) by mouth daily.  Dispense: 90 tablet; Refill: 1 ?- CBC with Differential/Platelet ?- CMP14+EGFR ? ?2. Hyperlipidemia with target LDL less than 100 ?Low fat diet ?- atorvastatin (LIPITOR) 40 MG tablet; Take 1 tablet (40 mg total) by mouth daily at 6 PM. (NEEDS TO BE SEEN BEFORE NEXT REFILL)  Dispense: 90 tablet; Refill: 1 ?- Lipid panel ? ?3. Idiopathic chronic gout without tophus, unspecified site ?- allopurinol (ZYLOPRIM) 300 MG tablet; Take 1 tablet (300 mg total) by mouth daily.  Dispense: 90 tablet; Refill: 1 ? ?4. GAD (generalized anxiety disorder) ?Stress management ?- ToxASSURE [place] 13 (MW), Urine ?- diazepam (VALIUM) 5 MG tablet; Take 1 tablet (5 mg total) by mouth every 12 (twelve) hours as needed for anxiety.  Dispense: 60 tablet; Refill: 5 ? ?5. BMI 34.0-34.9,adult ?Discussed diet and exercise for person with BMI >25 ?Will recheck weight in 3-6 months ? ? ?6. Prostate cancer screening ?- PSA, total and free ? ? ?Labs pending ?Health Maintenance reviewed ?Diet and exercise encouraged ? ?Follow up plan: ?6 months ? ? ?Mary-Margaret Hassell Done, FNP ? ?

## 2021-10-28 LAB — THC,MS,WB/SP RFX
Cannabidiol: NEGATIVE ng/mL
Cannabinoid Confirmation: POSITIVE
Carboxy-THC: 15.1 ng/mL
Hydroxy-THC: NEGATIVE ng/mL
Tetrahydrocannabinol(THC): NEGATIVE ng/mL

## 2021-10-28 LAB — DRUG SCREEN 10 W/CONF, SERUM
Amphetamines, IA: NEGATIVE ng/mL
Barbiturates, IA: NEGATIVE ug/mL
Cocaine & Metabolite, IA: NEGATIVE ng/mL
Methadone, IA: NEGATIVE ng/mL
Opiates, IA: NEGATIVE ng/mL
Oxycodones, IA: NEGATIVE ng/mL
Phencyclidine, IA: NEGATIVE ng/mL
Propoxyphene, IA: NEGATIVE ng/mL
THC(Marijuana) Metabolite, IA: POSITIVE ng/mL — AB

## 2021-10-28 LAB — CMP14+EGFR
ALT: 34 IU/L (ref 0–44)
AST: 27 IU/L (ref 0–40)
Albumin/Globulin Ratio: 2.4 — ABNORMAL HIGH (ref 1.2–2.2)
Albumin: 4.7 g/dL (ref 3.8–4.9)
Alkaline Phosphatase: 67 IU/L (ref 44–121)
BUN/Creatinine Ratio: 19 (ref 10–24)
BUN: 20 mg/dL (ref 8–27)
Bilirubin Total: 0.4 mg/dL (ref 0.0–1.2)
CO2: 24 mmol/L (ref 20–29)
Calcium: 9.6 mg/dL (ref 8.6–10.2)
Chloride: 105 mmol/L (ref 96–106)
Creatinine, Ser: 1.03 mg/dL (ref 0.76–1.27)
Globulin, Total: 2 g/dL (ref 1.5–4.5)
Glucose: 107 mg/dL — ABNORMAL HIGH (ref 70–99)
Potassium: 4.8 mmol/L (ref 3.5–5.2)
Sodium: 140 mmol/L (ref 134–144)
Total Protein: 6.7 g/dL (ref 6.0–8.5)
eGFR: 83 mL/min/{1.73_m2} (ref >59)

## 2021-10-28 LAB — BENZODIAZEPINES,MS,WB/SP RFX

## 2021-10-28 LAB — LIPID PANEL
Chol/HDL Ratio: 3.2 ratio (ref 0.0–5.0)
Cholesterol, Total: 126 mg/dL (ref 100–199)
HDL: 39 mg/dL — ABNORMAL LOW (ref >39)
LDL Chol Calc (NIH): 68 mg/dL (ref 0–99)
Triglycerides: 103 mg/dL (ref 0–149)
VLDL Cholesterol Cal: 19 mg/dL (ref 5–40)

## 2021-10-28 LAB — CBC WITH DIFFERENTIAL/PLATELET
Basophils Absolute: 0 10*3/uL (ref 0.0–0.2)
Basos: 1 %
EOS (ABSOLUTE): 0.1 10*3/uL (ref 0.0–0.4)
Eos: 1 %
Hematocrit: 45 % (ref 37.5–51.0)
Hemoglobin: 15.5 g/dL (ref 13.0–17.7)
Immature Grans (Abs): 0 10*3/uL (ref 0.0–0.1)
Immature Granulocytes: 0 %
Lymphocytes Absolute: 1.8 10*3/uL (ref 0.7–3.1)
Lymphs: 33 %
MCH: 30.2 pg (ref 26.6–33.0)
MCHC: 34.4 g/dL (ref 31.5–35.7)
MCV: 88 fL (ref 79–97)
Monocytes Absolute: 0.4 10*3/uL (ref 0.1–0.9)
Monocytes: 8 %
Neutrophils Absolute: 3.1 10*3/uL (ref 1.4–7.0)
Neutrophils: 57 %
Platelets: 234 10*3/uL (ref 150–450)
RBC: 5.14 x10E6/uL (ref 4.14–5.80)
RDW: 12.9 % (ref 11.6–15.4)
WBC: 5.5 10*3/uL (ref 3.4–10.8)

## 2021-10-28 LAB — PSA, TOTAL AND FREE
PSA, Free Pct: 55.6 %
PSA, Free: 0.5 ng/mL
Prostate Specific Ag, Serum: 0.9 ng/mL (ref 0.0–4.0)

## 2021-11-06 ENCOUNTER — Other Ambulatory Visit: Payer: Self-pay | Admitting: Orthopaedic Surgery

## 2021-11-06 MED ORDER — HYDROCODONE-ACETAMINOPHEN 5-325 MG PO TABS: 1.0000 | ORAL_TABLET | Freq: Four times a day (QID) | ORAL | 0 refills | Status: AC | PRN

## 2021-11-06 NOTE — Telephone Encounter (Signed)
Patient called requesting a refill on his pain medicine    HYDROcodone-acetaminophen (NORCO/VICODIN) 5-325 MG tablet  Pharmacy: [facility]

## 2021-11-18 ENCOUNTER — Telehealth: Payer: Self-pay | Admitting: Orthopaedic Surgery

## 2021-11-18 MED ORDER — HYDROCODONE-ACETAMINOPHEN 5-325 MG PO TABS
1.0000 | ORAL_TABLET | Freq: Four times a day (QID) | ORAL | 0 refills | Status: DC | PRN
Start: 2021-11-18 — End: 2021-12-22

## 2021-11-18 NOTE — Telephone Encounter (Signed)
Patient called for refill: HYDROcodone-acetaminophen (NORCO/VICODIN) 5-325 MG tablet [place]  - patient is aware of next scheduled appointment with Dr Luna Glasgow 11/26/21

## 2021-11-26 ENCOUNTER — Encounter: Payer: Self-pay | Admitting: Orthopaedic Surgery

## 2021-11-26 ENCOUNTER — Ambulatory Visit: Payer: Medicare Other | Admitting: Orthopaedic Surgery

## 2021-11-26 VITALS — BP 145/80 | HR 63 | Ht 68.0 in | Wt 220.0 lb

## 2021-11-26 DIAGNOSIS — M25511 Pain in right shoulder: Secondary | ICD-10-CM | POA: Diagnosis not present

## 2021-11-26 DIAGNOSIS — M7711 Lateral epicondylitis, right elbow: Secondary | ICD-10-CM | POA: Diagnosis not present

## 2021-11-26 DIAGNOSIS — G8929 Other chronic pain: Secondary | ICD-10-CM | POA: Diagnosis not present

## 2021-11-26 NOTE — Progress Notes (Signed)
My shoulder still hurts.  He has chronic pain of the right shoulder and right elbow. His shoulder is more painful. He has good and bad days.  His wife is an invalid and he is the main caregiver.  He has no swelling, no numbness.  Right shoulder has good ROM with pain in the extremes.  NV intact.  He has chronic pain of the right lateral epicondyle.  Encounter Diagnoses  Name Primary?   Chronic right shoulder pain Yes   Lateral epicondylitis, right elbow    Return in three months.  Call if any problem.  Precautions discussed.  Electronically Signed Sanjuana Kava, MD 6/13/20239:32 AM

## 2021-12-20 ENCOUNTER — Telehealth: Payer: Self-pay | Admitting: Orthopaedic Surgery

## 2021-12-20 NOTE — Telephone Encounter (Signed)
Patient requests refill - aware requests received after noon on Thursdays will be addressed on provider's next clinic day:  HYDROcodone-acetaminophen (NORCO/VICODIN) 5-325 MG tablet 110 tablet        [employer]

## 2021-12-21 ENCOUNTER — Telehealth: Payer: Self-pay | Admitting: Orthopaedic Surgery

## 2022-01-22 ENCOUNTER — Telehealth: Payer: Self-pay

## 2022-01-22 MED ORDER — HYDROCODONE-ACETAMINOPHEN 5-325 MG PO TABS: ORAL_TABLET | ORAL | 0 refills | Status: DC

## 2022-01-22 NOTE — Telephone Encounter (Signed)
Hydrocodone-Acetaminophen 5/325 MG  Qty 110 Tablets  PATIENT USES [facility]

## 2022-02-18 ENCOUNTER — Encounter: Payer: Self-pay | Admitting: Orthopaedic Surgery

## 2022-02-18 ENCOUNTER — Ambulatory Visit: Payer: Medicare Other | Admitting: Orthopaedic Surgery

## 2022-02-18 VITALS — BP 125/73 | HR 67 | Ht 68.0 in | Wt 220.0 lb

## 2022-02-18 DIAGNOSIS — M7711 Lateral epicondylitis, right elbow: Secondary | ICD-10-CM

## 2022-02-18 DIAGNOSIS — G8929 Other chronic pain: Secondary | ICD-10-CM

## 2022-02-18 DIAGNOSIS — M25511 Pain in right shoulder: Secondary | ICD-10-CM

## 2022-02-18 MED ORDER — HYDROCODONE-ACETAMINOPHEN 5-325 MG PO TABS: ORAL_TABLET | ORAL | 0 refills | Status: DC

## 2022-02-18 NOTE — Progress Notes (Signed)
I am sore at times.  He has chronic pain of the right shoulder and right elbow. He has good and bad days.  He has no new trauma.  He is taking his medicine.  ROM of the right shoulder is good with pain in the extremes.  Right elbow is full but tender.  NV intact.  Encounter Diagnoses  Name Primary?   Chronic right shoulder pain Yes   Lateral epicondylitis, right elbow    I have reviewed the [place] web site prior to prescribing narcotic medicine for this patient.  Return in three months.  His wife has cancer again, a new cancer that is in the groin area.  They are to see a GYN cancer specialist.  Call if any problem.  Precautions discussed.  Electronically Signed Sanjuana Kava, MD 9/5/20239:41 AM

## 2022-03-24 ENCOUNTER — Telehealth: Payer: Self-pay | Admitting: Orthopaedic Surgery

## 2022-03-24 NOTE — Telephone Encounter (Signed)
Patient requests refill - aware that while Dr Luna Glasgow is out of clinic this week, refill requests are being reviewed by our other providers: HYDROcodone-acetaminophen (NORCO/VICODIN) 5-325 MG tablet 110 tablet       [facility]

## 2022-03-25 MED ORDER — HYDROCODONE-ACETAMINOPHEN 5-325 MG PO TABS: 1.0000 | ORAL_TABLET | Freq: Four times a day (QID) | ORAL | 0 refills | Status: AC | PRN

## 2022-04-02 ENCOUNTER — Telehealth: Payer: Self-pay | Admitting: Orthopaedic Surgery

## 2022-04-02 ENCOUNTER — Other Ambulatory Visit: Payer: Self-pay | Admitting: Nurse Practitioner

## 2022-04-02 DIAGNOSIS — E785 Hyperlipidemia, unspecified: Secondary | ICD-10-CM

## 2022-04-02 NOTE — Telephone Encounter (Signed)
Patient called for refill - states his own pharmacy [facility]) is out of his medication* (*aware to call back to let Dr know which pharmacy has it in stock)  HYDROcodone-acetaminophen (NORCO/VICODIN) 5-325 MG tablet

## 2022-04-02 NOTE — Telephone Encounter (Signed)
Cont'd:  Patient called back re: refill - which his pharmacy [facility]) is out of: Requests to have refill sent to: [facility] in [place]; states verified it is in stock there:  HYDROcodone-acetaminophen (NORCO/VICODIN) 5-325 MG tablet

## 2022-04-03 MED ORDER — HYDROCODONE-ACETAMINOPHEN 5-325 MG PO TABS: 1.0000 | ORAL_TABLET | Freq: Four times a day (QID) | ORAL | 0 refills | Status: DC | PRN

## 2022-04-03 NOTE — Addendum Note (Signed)
Addended by: Willette Pa on: 04/03/2022 08:03 AM   Modules accepted: Orders

## 2022-04-03 NOTE — Addendum Note (Signed)
Addended by: Willette Pa on: 04/03/2022 08:02 AM   Modules accepted: Orders

## 2022-04-17 ENCOUNTER — Encounter: Payer: Self-pay | Admitting: Nurse Practitioner

## 2022-04-17 ENCOUNTER — Ambulatory Visit (INDEPENDENT_AMBULATORY_CARE_PROVIDER_SITE_OTHER): Payer: Medicare Other | Admitting: Nurse Practitioner

## 2022-04-17 VITALS — BP 112/73 | HR 62 | Temp 98.1°F | Resp 20 | Ht 68.0 in | Wt 225.0 lb

## 2022-04-17 DIAGNOSIS — M1A00X0 Idiopathic chronic gout, unspecified site, without tophus (tophi): Secondary | ICD-10-CM | POA: Diagnosis not present

## 2022-04-17 DIAGNOSIS — Z6834 Body mass index (BMI) 34.0-34.9, adult: Secondary | ICD-10-CM

## 2022-04-17 DIAGNOSIS — E785 Hyperlipidemia, unspecified: Secondary | ICD-10-CM

## 2022-04-17 DIAGNOSIS — F411 Generalized anxiety disorder: Secondary | ICD-10-CM

## 2022-04-17 DIAGNOSIS — I10 Essential (primary) hypertension: Secondary | ICD-10-CM | POA: Diagnosis not present

## 2022-04-17 LAB — CMP14+EGFR
ALT: 42 IU/L (ref 0–44)
AST: 24 IU/L (ref 0–40)
Albumin/Globulin Ratio: 2.1 (ref 1.2–2.2)
Albumin: 4.5 g/dL (ref 3.8–4.9)
Alkaline Phosphatase: 63 IU/L (ref 44–121)
BUN/Creatinine Ratio: 12 (ref 10–24)
BUN: 16 mg/dL (ref 8–27)
Bilirubin Total: 0.5 mg/dL (ref 0.0–1.2)
CO2: 24 mmol/L (ref 20–29)
Calcium: 9.2 mg/dL (ref 8.6–10.2)
Chloride: 103 mmol/L (ref 96–106)
Creatinine, Ser: 1.32 mg/dL — ABNORMAL HIGH (ref 0.76–1.27)
Globulin, Total: 2.1 g/dL (ref 1.5–4.5)
Glucose: 114 mg/dL — ABNORMAL HIGH (ref 70–99)
Potassium: 4.6 mmol/L (ref 3.5–5.2)
Sodium: 138 mmol/L (ref 134–144)
Total Protein: 6.6 g/dL (ref 6.0–8.5)
eGFR: 62 mL/min/{1.73_m2} (ref >59)

## 2022-04-17 LAB — CBC WITH DIFFERENTIAL/PLATELET
Basophils Absolute: 0 10*3/uL (ref 0.0–0.2)
Basos: 1 %
EOS (ABSOLUTE): 0.1 10*3/uL (ref 0.0–0.4)
Eos: 1 %
Hematocrit: 43.7 % (ref 37.5–51.0)
Hemoglobin: 14.7 g/dL (ref 13.0–17.7)
Immature Grans (Abs): 0 10*3/uL (ref 0.0–0.1)
Immature Granulocytes: 0 %
Lymphocytes Absolute: 1.9 10*3/uL (ref 0.7–3.1)
Lymphs: 32 %
MCH: 30.1 pg (ref 26.6–33.0)
MCHC: 33.6 g/dL (ref 31.5–35.7)
MCV: 89 fL (ref 79–97)
Monocytes Absolute: 0.6 10*3/uL (ref 0.1–0.9)
Monocytes: 10 %
Neutrophils Absolute: 3.3 10*3/uL (ref 1.4–7.0)
Neutrophils: 56 %
Platelets: 224 10*3/uL (ref 150–450)
RBC: 4.89 x10E6/uL (ref 4.14–5.80)
RDW: 13.1 % (ref 11.6–15.4)
WBC: 5.8 10*3/uL (ref 3.4–10.8)

## 2022-04-17 LAB — LIPID PANEL
Chol/HDL Ratio: 2.9 ratio (ref 0.0–5.0)
Cholesterol, Total: 112 mg/dL (ref 100–199)
HDL: 39 mg/dL — ABNORMAL LOW (ref >39)
LDL Chol Calc (NIH): 55 mg/dL (ref 0–99)
Triglycerides: 92 mg/dL (ref 0–149)
VLDL Cholesterol Cal: 18 mg/dL (ref 5–40)

## 2022-04-17 MED ORDER — LISINOPRIL 10 MG PO TABS: 10.0000 mg | ORAL_TABLET | Freq: Every day | ORAL | 1 refills | Status: DC

## 2022-04-17 MED ORDER — ATORVASTATIN CALCIUM 40 MG PO TABS: ORAL_TABLET | ORAL | 1 refills | Status: DC

## 2022-04-17 MED ORDER — DIAZEPAM 5 MG PO TABS: 5.0000 mg | ORAL_TABLET | Freq: Two times a day (BID) | ORAL | 5 refills | Status: DC | PRN

## 2022-04-17 NOTE — Progress Notes (Signed)
Subjective:    Patient ID: Andre Hunt, male    DOB: July 28, 1960, 61 y.o.   MRN: [id number]   Chief Complaint: medical management of chronic issues     HPI:  Andre Hunt is a 61 y.o. who identifies as a male who was assigned male at birth.   Social history: Lives with: wife Work history: disability   Comes in today for follow up of the following chronic medical issues:  1. Essential hypertension No c/o chest pain, sob or headache. Does not check blood pressure at home. BP Readings from Last 3 Encounters:  02/18/22 125/73  11/26/21 (!) 145/80  10/14/21 113/74     2. Hyperlipidemia with target LDL less than 100 Does try to watch diet , but does no dedicated exercise. Lab Results  Component Value Date   CHOL 126 10/14/2021   HDL 39 (L) 10/14/2021   LDLCALC 68 10/14/2021   TRIG 103 10/14/2021   CHOLHDL 3.2 10/14/2021   The ASCVD Risk score (Arnett DK, et al., 2019) failed to calculate for the following reasons:   The valid total cholesterol range is 130 to 320 mg/dL    3. GAD (generalized anxiety disorder) Is on valium BID- usually takes at least 1x a day andsome days 2x.    04/17/2022    8:40 AM 10/14/2021   10:55 AM 04/16/2021   10:49 AM 03/13/2020    9:41 AM  GAD 7 : Generalized Anxiety Score  Nervous, Anxious, on Edge 0 0 0 3  Control/stop worrying 0 0 0 3  Worry too much - different things 0 0 0 3  Trouble relaxing 0 0 0 3  Restless 0 0 0 0  Easily annoyed or irritable 0 0 0 1  Afraid - awful might happen 0 0 0 1  Total GAD 7 Score 0 0 0 14  Anxiety Difficulty Not difficult at all Not difficult at all Not difficult at all Not difficult at all      4. Idiopathic chronic gout without tophus, unspecified site No recent gout flare ups  5. BMI 34.0-34.9,adult No recent weight changes Wt Readings from Last 3 Encounters:  04/17/22 225 lb (102.1 kg)  02/18/22 220 lb (99.8 kg)  11/26/21 220 lb (99.8 kg)   BMI Readings from Last 3 Encounters:   04/17/22 34.21 kg/m  02/18/22 33.45 kg/m  11/26/21 33.45 kg/m     New complaints: None today   Allergies  Allergen Reactions   Iohexol Anaphylaxis    Allergy to IVP dye   Outpatient Encounter Medications as of 04/17/2022  Medication Sig   allopurinol (ZYLOPRIM) 300 MG tablet Take 1 tablet (300 mg total) by mouth daily.   atorvastatin (LIPITOR) 40 MG tablet TAKE ONE TABLET ONCE DAILY AT 6PM   cetirizine (ZYRTEC) 10 MG tablet TAKE 1 TABLET DAILY   colchicine 0.6 MG tablet One tid po for five days.   diazepam (VALIUM) 5 MG tablet Take 1 tablet (5 mg total) by mouth every 12 (twelve) hours as needed for anxiety.   HYDROcodone-acetaminophen (NORCO/VICODIN) 5-325 MG tablet Take 1 tablet by mouth every 6 (six) hours as needed for moderate pain (Must last 30 days.).   hydrocortisone (ANUSOL-HC) 2.5 % rectal cream Place 1 application rectally 2 (two) times daily as needed for hemorrhoids or anal itching.   lisinopril (ZESTRIL) 10 MG tablet Take 1 tablet (10 mg total) by mouth daily.   naproxen (NAPROSYN) 500 MG tablet TAKE 1 TABLET ONCE DAILY WITH MEAL  promethazine (PHENERGAN) 12.5 MG tablet Take 1 tablet (12.5 mg total) by mouth every 8 (eight) hours as needed for nausea or vomiting.   No facility-administered encounter medications on file as of 04/17/2022.    Past Surgical History:  Procedure Laterality Date   APPENDECTOMY  1988   CARPAL TUNNEL RELEASE     bilateral 2000 and 2001   COLONOSCOPY  09/23/2012   ELBOW SURGERY  2006   muscle repair   EXPLORATORY LAPAROTOMY  1996   after falling on scissors; repair of "hole in intestines"   [place]    Family History  Problem Relation Age of Onset   Kidney cancer Mother    Diabetes Mother    COPD Mother    Anuerysm Father    Diabetes Father    Heart disease Father    Diabetes Sister    Colon cancer Neg Hx    Colon polyps Neg Hx    Esophageal cancer  Neg Hx    Rectal cancer Neg Hx    Stomach cancer Neg Hx       Controlled substance contract: n/a     Review of Systems  Constitutional:  Negative for diaphoresis.  Eyes:  Negative for pain.  Respiratory:  Negative for shortness of breath.   Cardiovascular:  Negative for chest pain, palpitations and leg swelling.  Gastrointestinal:  Negative for abdominal pain.  Endocrine: Negative for polydipsia.  Skin:  Negative for rash.  Neurological:  Negative for dizziness, weakness and headaches.  Hematological:  Does not bruise/bleed easily.  All other systems reviewed and are negative.      Objective:   Physical Exam Vitals and nursing note reviewed.  Constitutional:      Appearance: Normal appearance. He is well-developed.  HENT:     Head: Normocephalic.     Nose: Nose normal.     Mouth/Throat:     Mouth: Mucous membranes are moist.     Pharynx: Oropharynx is clear.  Eyes:     Pupils: Pupils are equal, round, and reactive to light.  Neck:     Thyroid: No thyroid mass or thyromegaly.     Vascular: No carotid bruit or JVD.     Trachea: Phonation normal.  Cardiovascular:     Rate and Rhythm: Normal rate and regular rhythm.  Pulmonary:     Effort: Pulmonary effort is normal. No respiratory distress.     Breath sounds: Normal breath sounds.  Abdominal:     General: Bowel sounds are normal.     Palpations: Abdomen is soft.     Tenderness: There is no abdominal tenderness.  Musculoskeletal:        General: Normal range of motion.     Cervical back: Normal range of motion and neck supple.  Lymphadenopathy:     Cervical: No cervical adenopathy.  Skin:    General: Skin is warm and dry.  Neurological:     Mental Status: He is alert and oriented to person, place, and time.  Psychiatric:        Behavior: Behavior normal.        Thought Content: Thought content normal.        Judgment: Judgment normal.     BP 112/73   Pulse 62   Temp 98.1 F (36.7 C) (Temporal)    Resp 20   Ht _0  (1.727 m)   Wt 225 lb (102.1 kg)  SpO2 94%   BMI 34.21 kg/m        Assessment & Plan:   Andre Hunt comes in today with chief complaint of Medical Management of Chronic Issues   Diagnosis and orders addressed:  1. Essential hypertension Low sodium diet - lisinopril (ZESTRIL) 10 MG tablet; Take 1 tablet (10 mg total) by mouth daily.  Dispense: 90 tablet; Refill: 1 - CBC with Differential/Platelet - CMP14+EGFR  2. Hyperlipidemia with target LDL less than 100 Low fat diet - atorvastatin (LIPITOR) 40 MG tablet; TAKE ONE TABLET ONCE DAILY AT 6PM  Dispense: 90 tablet; Refill: 1 - Lipid panel  3. GAD (generalized anxiety disorder) Stress management - diazepam (VALIUM) 5 MG tablet; Take 1 tablet (5 mg total) by mouth every 12 (twelve) hours as needed for anxiety.  Dispense: 60 tablet; Refill: 5  4. Idiopathic chronic gout without tophus, unspecified site Low purine diet  5. BMI 34.0-34.9,adult Discussed diet and exercise for person with BMI >25 Will recheck weight in 3-6 months    Labs pending Health Maintenance reviewed Diet and exercise encouraged  Follow up plan: 6 months   Mary-Margaret Hassell Done, FNP

## 2022-05-02 ENCOUNTER — Telehealth: Payer: Self-pay | Admitting: Orthopaedic Surgery

## 2022-05-02 NOTE — Telephone Encounter (Signed)
Request sent to provider to review when he returns to office.

## 2022-05-02 NOTE — Telephone Encounter (Signed)
Patient called requesting a refill on Hydrocodone 5-325, 110 quantity, every 6hrs be sent to [place].

## 2022-05-05 MED ORDER — HYDROCODONE-ACETAMINOPHEN 5-325 MG PO TABS: 1.0000 | ORAL_TABLET | Freq: Four times a day (QID) | ORAL | 0 refills | Status: AC | PRN

## 2022-05-06 ENCOUNTER — Other Ambulatory Visit: Payer: Self-pay | Admitting: Nurse Practitioner

## 2022-05-06 DIAGNOSIS — F411 Generalized anxiety disorder: Secondary | ICD-10-CM

## 2022-05-13 ENCOUNTER — Ambulatory Visit (INDEPENDENT_AMBULATORY_CARE_PROVIDER_SITE_OTHER): Payer: Medicare Other

## 2022-05-13 VITALS — Ht 68.0 in | Wt 218.0 lb

## 2022-05-13 DIAGNOSIS — Z0000 Encounter for general adult medical examination without abnormal findings: Secondary | ICD-10-CM

## 2022-05-13 NOTE — Patient Instructions (Signed)
[name] , Thank you for taking time to come for your Medicare Wellness Visit. I appreciate your ongoing commitment to your health goals. Please review the following plan we discussed and let me know if I can assist you in the future.   These are the goals we discussed:  Goals      Achieve a Healthy Weight-Obesity     Timeframe:  Long-Range Goal Priority:  Medium Start Date: 04/24/2021                        Expected End Date: 04/25/2022                      Follow Up Date 04/25/2022    - drink 6 to 8 glasses of water each day - eat 3 to 5 servings of fruits and vegetables each day - eat fish at least once per week - fill half the plate with nonstarchy vegetables - limit fast food meals to no more than 1 per week - manage portion size - set a realistic goal - set goal weight    Why is this important?   When you are ready to manage your weight, have a plan and have set a goal, it is time to take action.  Taking small steps to change how you eat and exercise is a good place to start.    Notes: Aim for weight of 195 by this time next year     DIET - INCREASE WATER INTAKE        This is a list of the screening recommended for you and due dates:  Health Maintenance  Topic Date Due   HIV Screening  Never done   COVID-19 Vaccine (5 - 2023-24 season) 02/14/2022   Flu Shot  09/14/2022*   Colon Cancer Screening  08/23/2022   Medicare Annual Wellness Visit  05/14/2023   Hepatitis C Screening: USPSTF Recommendation to screen - Ages 18-79 yo.  Completed   Zoster (Shingles) Vaccine  Completed   HPV Vaccine  Aged Out  *Topic was postponed. The date shown is not the original due date.    Advanced directives: Please bring a copy of your health care power of attorney and living will to the office to be added to your chart at your convenience.   Conditions/risks identified: Aim for 30 minutes of exercise or brisk walking, 6-8 glasses of water, and 5 servings of fruits and vegetables  each day.   Next appointment: Follow up in one year for your annual wellness visit   Preventive Care 40-64 Years, Male Preventive care refers to lifestyle choices and visits with your health care provider that can promote health and wellness. What does preventive care include? A yearly physical exam. This is also called an annual well check. Dental exams once or twice a year. Routine eye exams. Ask your health care provider how often you should have your eyes checked. Personal lifestyle choices, including: Daily care of your teeth and gums. Regular physical activity. Eating a healthy diet. Avoiding tobacco and drug use. Limiting alcohol use. Practicing safe sex. Taking low-dose aspirin every day starting at age 54. What happens during an annual well check? The services and screenings done by your health care provider during your annual well check will depend on your age, overall health, lifestyle risk factors, and family history of disease. Counseling  Your health care provider may ask you questions about your: Alcohol use. Tobacco use.  Drug use. Emotional well-being. Home and relationship well-being. Sexual activity. Eating habits. Work and work Statistician. Screening  You may have the following tests or measurements: Height, weight, and BMI. Blood pressure. Lipid and cholesterol levels. These may be checked every 5 years, or more frequently if you are over 29 years old. Skin check. Lung cancer screening. You may have this screening every year starting at age 69 if you have a 30-pack-year history of smoking and currently smoke or have quit within the past 15 years. Fecal occult blood test (FOBT) of the stool. You may have this test every year starting at age 10. Flexible sigmoidoscopy or colonoscopy. You may have a sigmoidoscopy every 5 years or a colonoscopy every 10 years starting at age 2. Prostate cancer screening. Recommendations will vary depending on your family  history and other risks. Hepatitis C blood test. Hepatitis B blood test. Sexually transmitted disease (STD) testing. Diabetes screening. This is done by checking your blood sugar (glucose) after you have not eaten for a while (fasting). You may have this done every 1-3 years. Discuss your test results, treatment options, and if necessary, the need for more tests with your health care provider. Vaccines  Your health care provider may recommend certain vaccines, such as: Influenza vaccine. This is recommended every year. Tetanus, diphtheria, and acellular pertussis (Tdap, Td) vaccine. You may need a Td booster every 10 years. Zoster vaccine. You may need this after age 28. Pneumococcal 13-valent conjugate (PCV13) vaccine. You may need this if you have certain conditions and have not been vaccinated. Pneumococcal polysaccharide (PPSV23) vaccine. You may need one or two doses if you smoke cigarettes or if you have certain conditions. Talk to your health care provider about which screenings and vaccines you need and how often you need them. This information is not intended to replace advice given to you by your health care provider. Make sure you discuss any questions you have with your health care provider. Document Released: 06/29/2015 Document Revised: 02/20/2016 Document Reviewed: 04/03/2015 Elsevier Interactive Patient Education  2017 [place] Prevention in the Home Falls can cause injuries. They can happen to people of all ages. There are many things you can do to make your home safe and to help prevent falls. What can I do on the outside of my home? Regularly fix the edges of walkways and driveways and fix any cracks. Remove anything that might make you trip as you walk through a door, such as a raised step or threshold. Trim any bushes or trees on the path to your home. Use bright outdoor lighting. Clear any walking paths of anything that might make someone trip, such as rocks  or tools. Regularly check to see if handrails are loose or broken. Make sure that both sides of any steps have handrails. Any raised decks and porches should have guardrails on the edges. Have any leaves, snow, or ice cleared regularly. Use sand or salt on walking paths during winter. Clean up any spills in your garage right away. This includes oil or grease spills. What can I do in the bathroom? Use night lights. Install grab bars by the toilet and in the tub and shower. Do not use towel bars as grab bars. Use non-skid mats or decals in the tub or shower. If you need to sit down in the shower, use a plastic, non-slip stool. Keep the floor dry. Clean up any water that spills on the floor as soon as it happens. Remove soap  buildup in the tub or shower regularly. Attach bath mats securely with double-sided non-slip rug tape. Do not have throw rugs and other things on the floor that can make you trip. What can I do in the bedroom? Use night lights. Make sure that you have a light by your bed that is easy to reach. Do not use any sheets or blankets that are too big for your bed. They should not hang down onto the floor. Have a firm chair that has side arms. You can use this for support while you get dressed. Do not have throw rugs and other things on the floor that can make you trip. What can I do in the kitchen? Clean up any spills right away. Avoid walking on wet floors. Keep items that you use a lot in easy-to-reach places. If you need to reach something above you, use a strong step stool that has a grab bar. Keep electrical cords out of the way. Do not use floor polish or wax that makes floors slippery. If you must use wax, use non-skid floor wax. Do not have throw rugs and other things on the floor that can make you trip. What can I do with my stairs? Do not leave any items on the stairs. Make sure that there are handrails on both sides of the stairs and use them. Fix handrails that  are broken or loose. Make sure that handrails are as long as the stairways. Check any carpeting to make sure that it is firmly attached to the stairs. Fix any carpet that is loose or worn. Avoid having throw rugs at the top or bottom of the stairs. If you do have throw rugs, attach them to the floor with carpet tape. Make sure that you have a light switch at the top of the stairs and the bottom of the stairs. If you do not have them, ask someone to add them for you. What else can I do to help prevent falls? Wear shoes that: Do not have high heels. Have rubber bottoms. Are comfortable and fit you well. Are closed at the toe. Do not wear sandals. If you use a stepladder: Make sure that it is fully opened. Do not climb a closed stepladder. Make sure that both sides of the stepladder are locked into place. Ask someone to hold it for you, if possible. Clearly mark and make sure that you can see: Any grab bars or handrails. First and last steps. Where the edge of each step is. Use tools that help you move around (mobility aids) if they are needed. These include: Canes. Walkers. Scooters. Crutches. Turn on the lights when you go into a dark area. Replace any light bulbs as soon as they burn out. Set up your furniture so you have a clear path. Avoid moving your furniture around. If any of your floors are uneven, fix them. If there are any pets around you, be aware of where they are. Review your medicines with your doctor. Some medicines can make you feel dizzy. This can increase your chance of falling. Ask your doctor what other things that you can do to help prevent falls. This information is not intended to replace advice given to you by your health care provider. Make sure you discuss any questions you have with your health care provider. Document Released: 03/29/2009 Document Revised: 11/08/2015 Document Reviewed: 07/07/2014 Elsevier Interactive Patient Education  2017 [employer].

## 2022-05-13 NOTE — Progress Notes (Signed)
Subjective:   Andre Hunt is a 61 y.o. male who presents for Medicare Annual/Subsequent preventive examination. I connected with  [name] on 05/13/22 by a audio enabled telemedicine application and verified that I am speaking with the correct person using two identifiers.  Patient Location: Home  Provider Location: Home Office  I discussed the limitations of evaluation and management by telemedicine. The patient expressed understanding and agreed to proceed.  Review of Systems     Cardiac Risk Factors include: advanced age (>10mn, >>8women);hypertension     Objective:    Today's Vitals   05/13/22 1200  Weight: 218 lb (98.9 kg)  Height: '5\' 8"'$  (1.727 m)   Body mass index is 33.15 kg/m.     05/13/2022   12:04 PM 04/24/2021    1:35 PM  Advanced Directives  Does Patient Have a Medical Advance Directive? No No  Would patient like information on creating a medical advance directive? No - Patient declined No - Patient declined    Current Medications (verified) Outpatient Encounter Medications as of 05/13/2022  Medication Sig   allopurinol (ZYLOPRIM) 300 MG tablet Take 1 tablet (300 mg total) by mouth daily.   atorvastatin (LIPITOR) 40 MG tablet TAKE ONE TABLET ONCE DAILY AT 6PM   cetirizine (ZYRTEC) 10 MG tablet TAKE 1 TABLET DAILY   colchicine 0.6 MG tablet One tid po for five days.   diazepam (VALIUM) 5 MG tablet Take 1 tablet (5 mg total) by mouth every 12 (twelve) hours as needed for anxiety.   HYDROcodone-acetaminophen (NORCO/VICODIN) 5-325 MG tablet Take 1 tablet by mouth every 6 (six) hours as needed for moderate pain (Must last 30 days.).   hydrocortisone (ANUSOL-HC) 2.5 % rectal cream Place 1 application rectally 2 (two) times daily as needed for hemorrhoids or anal itching.   lisinopril (ZESTRIL) 10 MG tablet Take 1 tablet (10 mg total) by mouth daily.   naproxen (NAPROSYN) 500 MG tablet TAKE 1 TABLET ONCE DAILY WITH MEAL   promethazine (PHENERGAN) 12.5  MG tablet Take 1 tablet (12.5 mg total) by mouth every 8 (eight) hours as needed for nausea or vomiting.   No facility-administered encounter medications on file as of 05/13/2022.    Allergies (verified) Iohexol   History: Past Medical History:  Diagnosis Date   Allergy    Arthritis    Gout    Hyperlipidemia    Hypertension    Personal history of colonic adenoma 09/28/2012   Past Surgical History:  Procedure Laterality Date   APPENDECTOMY  1988   CARPAL TUNNEL RELEASE     bilateral 2000 and 2001   COLONOSCOPY  09/23/2012   ELBOW SURGERY  2006   muscle repair   EXPLORATORY LAPAROTOMY  1996   after falling on scissors; repair of "hole in intestines"   IHamlet  Family History  Problem Relation Age of Onset   Kidney cancer Mother    Diabetes Mother    COPD Mother    Anuerysm Father    Diabetes Father    Heart disease Father    Diabetes Sister    Colon cancer Neg Hx    Colon polyps Neg Hx    Esophageal cancer Neg Hx    Rectal cancer Neg Hx    Stomach cancer Neg Hx    Social History   Socioeconomic History   Marital status: Married    Spouse name:  Not on file   Number of children: Not on file   Years of education: Not on file   Highest education level: Not on file  Occupational History   Occupation: disability/ partial  Tobacco Use   Smoking status: Never   Smokeless tobacco: Current    Types: Snuff  Vaping Use   Vaping Use: Never used  Substance and Sexual Activity   Alcohol use: No   Drug use: No   Sexual activity: Not on file  Other Topics Concern   Not on file  Social History Narrative   Not on file   Social Determinants of Health   Financial Resource Strain: Low Risk  (05/13/2022)   Overall Financial Resource Strain (CARDIA)    Difficulty of Paying Living Expenses: Not hard at all  Food Insecurity: No Food Insecurity (05/13/2022)   Hunger Vital Sign    Worried About  Running Out of Food in the Last Year: Never true    Darling in the Last Year: Never true  Transportation Needs: No Transportation Needs (05/13/2022)   PRAPARE - Hydrologist (Medical): No    Lack of Transportation (Non-Medical): No  Physical Activity: Sufficiently Active (05/13/2022)   Exercise Vital Sign    Days of Exercise per Week: 3 days    Minutes of Exercise per Session: 60 min  Stress: No Stress Concern Present (05/13/2022)   [place]    Feeling of Stress : Not at all  Social Connections: Socially Isolated (05/13/2022)   Social Connection and Isolation Panel [NHANES]    Frequency of Communication with Friends and Family: More than three times a week    Frequency of Social Gatherings with Friends and Family: More than three times a week    Attends Religious Services: Never    Marine scientist or Organizations: No    Attends Music therapist: Never    Marital Status: Separated    Tobacco Counseling Ready to quit: Not Answered Counseling given: Not Answered   Clinical Intake:  Pre-visit preparation completed: Yes  Pain : No/denies pain     Nutritional Risks: None Diabetes: No  How often do you need to have someone help you when you read instructions, pamphlets, or other written materials from your doctor or pharmacy?: 1 - Never  Diabetic?no   Interpreter Needed?: No  Information entered by :: Jadene Pierini, LPN   Activities of Daily Living    05/13/2022   12:04 PM  In your present state of health, do you have any difficulty performing the following activities:  Hearing? 0  Vision? 0  Difficulty concentrating or making decisions? 0  Walking or climbing stairs? 0  Dressing or bathing? 0  Doing errands, shopping? 0  Preparing Food and eating ? N  Using the Toilet? N  In the past six months, have you accidently leaked urine? N  Do you  have problems with loss of bowel control? N  Managing your Medications? N  Managing your Finances? N  Housekeeping or managing your Housekeeping? N    Patient Care Team: Chevis Pretty, FNP as PCP - General (Nurse Practitioner) Sanjuana Kava, MD as Attending Physician (Orthopedic Surgery) Gatha Mayer, MD as Consulting Physician (Gastroenterology)  Indicate any recent Medical Services you may have received from other than [facility] providers in the past year (date may be approximate).     Assessment:   This is a routine  wellness examination for Glyn.  Hearing/Vision screen Vision Screening - Comments:: Wears rx glasses - up to date with routine eye exams with  Dr.Lee  Dietary issues and exercise activities discussed: Current Exercise Habits: Home exercise routine, Type of exercise: walking, Time (Minutes): 60, Frequency (Times/Week): 3, Weekly Exercise (Minutes/Week): 180, Intensity: Mild, Exercise limited by: None identified   Goals Addressed             This Visit's Progress    DIET - INCREASE WATER INTAKE         Depression Screen    05/13/2022   12:03 PM 04/17/2022    8:40 AM 10/14/2021   10:55 AM 04/24/2021    1:42 PM 04/16/2021   10:48 AM 09/14/2020   12:24 PM 03/13/2020    9:41 AM  PHQ 2/9 Scores  PHQ - 2 Score 0 0 0 0 0 0 0  PHQ- 9 Score 0 0 0 0 0      Fall Risk    05/13/2022   12:01 PM 04/17/2022    8:40 AM 10/14/2021   10:55 AM 04/24/2021    1:44 PM 04/16/2021   10:48 AM  Fall Risk   Falls in the past year? 0 0 0 0 0  Number falls in past yr: 0   0   Injury with Fall? 0   0   Risk for fall due to : No Fall Risks   Medication side effect;Orthopedic patient   Follow up Falls prevention discussed   Falls prevention discussed     FALL RISK PREVENTION PERTAINING TO THE HOME:  Any stairs in or around the home? No  If so, are there any without handrails? No  Home free of loose throw rugs in walkways, pet beds, electrical cords, etc? Yes  Adequate  lighting in your home to reduce risk of falls? Yes   ASSISTIVE DEVICES UTILIZED TO PREVENT FALLS:  Life alert? No  Use of a cane, walker or w/c? No  Grab bars in the bathroom? Yes  Shower chair or bench in shower? Yes  Elevated toilet seat or a handicapped toilet? Yes          05/13/2022   12:06 PM 04/24/2021    1:45 PM  6CIT Screen  What Year? 0 points 0 points  What month? 0 points 0 points  What time? 0 points 0 points  Count back from 20 0 points 0 points  Months in reverse 0 points 0 points  Repeat phrase 0 points 2 points  Total Score 0 points 2 points    Immunizations Immunization History  Administered Date(s) Administered   Moderna Sars-Covid-2 Vaccination 10/31/2019, 12/05/2019, 07/10/2020, 12/04/2020   Zoster Recombinat (Shingrix) 10/01/2020, 12/28/2020    TDAP status: Due, Education has been provided regarding the importance of this vaccine. Advised may receive this vaccine at local pharmacy or Health Dept. Aware to provide a copy of the vaccination record if obtained from local pharmacy or Health Dept. Verbalized acceptance and understanding.  Flu Vaccine status: Declined, Education has been provided regarding the importance of this vaccine but patient still declined. Advised may receive this vaccine at local pharmacy or Health Dept. Aware to provide a copy of the vaccination record if obtained from local pharmacy or Health Dept. Verbalized acceptance and understanding.  Pneumococcal vaccine status: Due, Education has been provided regarding the importance of this vaccine. Advised may receive this vaccine at local pharmacy or Health Dept. Aware to provide a copy of the vaccination record if  obtained from local pharmacy or Health Dept. Verbalized acceptance and understanding.  Covid-19 vaccine status: Completed vaccines  Qualifies for Shingles Vaccine? Yes   Zostavax completed Yes   Shingrix Completed?: Yes  Screening Tests Health Maintenance  Topic Date Due    HIV Screening  Never done   COVID-19 Vaccine (5 - 2023-24 season) 02/14/2022   INFLUENZA VACCINE  09/14/2022 (Originally 01/14/2022)   COLONOSCOPY (Pts 45-70yr Insurance coverage will need to be confirmed)  08/23/2022   Medicare Annual Wellness (AWV)  05/14/2023   Hepatitis C Screening  Completed   Zoster Vaccines- Shingrix  Completed   HPV VACCINES  Aged Out    Health Maintenance  Health Maintenance Due  Topic Date Due   HIV Screening  Never done   COVID-19 Vaccine (5 - 2023-24 season) 02/14/2022    Colorectal cancer screening: Type of screening: Colonoscopy. Completed 08/23/2019. Repeat every 3 years  Lung Cancer Screening: (Low Dose CT Chest recommended if Age 60-80years, 30 pack-year currently smoking OR have quit w/in 15years.) does not qualify.   Lung Cancer Screening Referral: n/a  Additional Screening:  Hepatitis C Screening: does not qualify;  Vision Screening: Recommended annual ophthalmology exams for early detection of glaucoma and other disorders of the eye. Is the patient up to date with their annual eye exam?  Yes  Who is the provider or what is the name of the office in which the patient attends annual eye exams? Dr.Lee If pt is not established with a provider, would they like to be referred to a provider to establish care? No .   Dental Screening: Recommended annual dental exams for proper oral hygiene  Community Resource Referral / Chronic Care Management: CRR required this visit?  No   CCM required this visit?  No      Plan:     I have personally reviewed and noted the following in the patient's chart:   Medical and social history Use of alcohol, tobacco or illicit drugs  Current medications and supplements including opioid prescriptions. Patient is not currently taking opioid prescriptions. Functional ability and status Nutritional status Physical activity Advanced directives List of other physicians Hospitalizations, surgeries, and ER  visits in previous 12 months Vitals Screenings to include cognitive, depression, and falls Referrals and appointments  In addition, I have reviewed and discussed with patient certain preventive protocols, quality metrics, and best practice recommendations. A written personalized care plan for preventive services as well as general preventive health recommendations were provided to patient.     LDaphane Shepherd LPN   [id number]  Nurse Notes: Due Flu/TDAP Vaccine

## 2022-05-20 ENCOUNTER — Ambulatory Visit: Payer: Medicare Other | Admitting: Orthopaedic Surgery

## 2022-05-20 ENCOUNTER — Encounter: Payer: Self-pay | Admitting: Orthopaedic Surgery

## 2022-05-20 VITALS — BP 150/88 | HR 64

## 2022-05-20 DIAGNOSIS — M25562 Pain in left knee: Secondary | ICD-10-CM | POA: Diagnosis not present

## 2022-05-20 DIAGNOSIS — M25511 Pain in right shoulder: Secondary | ICD-10-CM

## 2022-05-20 DIAGNOSIS — G8929 Other chronic pain: Secondary | ICD-10-CM | POA: Diagnosis not present

## 2022-05-20 NOTE — Progress Notes (Signed)
I feel better.  He has had good and bad days.  Over the last few days he has had some more pain but overall feels better. He has no new trauma.  His left knee is tender with swelling and crepitus.  ROM is 0 to 105, slight limp, stable.  His right shoulder has full ROM with pain in the extremes.  NV intact  Encounter Diagnoses  Name Primary?   Chronic right shoulder pain Yes   Chronic pain of left knee    I will give brace for knee.  Return in three months.  Continue his medicine.  Call if any problem.  Precautions discussed.  Electronically Signed Sanjuana Kava, MD 12/5/20232:00 PM

## 2022-06-02 ENCOUNTER — Other Ambulatory Visit: Payer: Self-pay | Admitting: Orthopedic Surgery

## 2022-06-02 ENCOUNTER — Telehealth: Payer: Self-pay | Admitting: Nurse Practitioner

## 2022-06-02 DIAGNOSIS — M1A00X0 Idiopathic chronic gout, unspecified site, without tophus (tophi): Secondary | ICD-10-CM

## 2022-06-02 MED ORDER — ALLOPURINOL 300 MG PO TABS: 300.0000 mg | ORAL_TABLET | Freq: Every day | ORAL | 0 refills | Status: DC

## 2022-06-02 NOTE — Telephone Encounter (Signed)
Sending pt's Allopurinol to [facility], they should have pt's Atorvastatin transferred from [facility].

## 2022-06-05 ENCOUNTER — Telehealth: Payer: Self-pay | Admitting: Orthopaedic Surgery

## 2022-06-05 NOTE — Telephone Encounter (Signed)
Patient called requesting a refill on Hydrocodone 5-325 be sent to [facility] in [place].  Pt's # [phone]

## 2022-06-06 MED ORDER — HYDROCODONE-ACETAMINOPHEN 5-325 MG PO TABS: 1.0000 | ORAL_TABLET | Freq: Four times a day (QID) | ORAL | 0 refills | Status: DC | PRN

## 2022-06-06 NOTE — Telephone Encounter (Signed)
The patient called and is checking on his refill request from yesterday.

## 2022-06-10 NOTE — Telephone Encounter (Signed)
Prescription refilled 06/06/22 by Dr.Cairns

## 2022-06-13 ENCOUNTER — Telehealth: Payer: Self-pay | Admitting: Nurse Practitioner

## 2022-06-13 NOTE — Telephone Encounter (Signed)
Pt's husband called back stating both pt and himself tested positive today for COVID and wanted to know what to do. Advised we don't have any further openings and that they could do MyChart virtual visit but pt declined. Advised to go to UC or Ed to be evaluated and see if they qualify for antiviral as if they do they would need to start it within a 5 day window from symptom onset. Pt's spouse states they are too sick to go anywhere and I advised pt PCP was not in office to be able to do televisit or squeeze them in and pt stated "this is the stupidest thing ever" and hung up. 

## 2022-06-16 DIAGNOSIS — U071 COVID-19: Secondary | ICD-10-CM | POA: Diagnosis not present

## 2022-06-17 ENCOUNTER — Telehealth: Payer: Self-pay | Admitting: Nurse Practitioner

## 2022-06-17 NOTE — Telephone Encounter (Signed)
Patient had Covid last Thursday and went to Urgent Care yesterday. Was given Paxlovid and is feeling better

## 2022-06-19 ENCOUNTER — Telehealth: Payer: Self-pay | Admitting: Orthopaedic Surgery

## 2022-06-19 NOTE — Telephone Encounter (Signed)
Patient called, lvm requesting a refill on his Hydrocodone 5-325 to be sent to [facility] in [place].  Pt's # [phone]

## 2022-06-23 MED ORDER — HYDROCODONE-ACETAMINOPHEN 5-325 MG PO TABS: 1.0000 | ORAL_TABLET | Freq: Four times a day (QID) | ORAL | 0 refills | Status: DC | PRN

## 2022-07-07 ENCOUNTER — Other Ambulatory Visit: Payer: Self-pay | Admitting: Nurse Practitioner

## 2022-07-07 DIAGNOSIS — E785 Hyperlipidemia, unspecified: Secondary | ICD-10-CM

## 2022-07-22 ENCOUNTER — Telehealth: Payer: Self-pay | Admitting: Orthopaedic Surgery

## 2022-07-22 MED ORDER — HYDROCODONE-ACETAMINOPHEN 5-325 MG PO TABS: 1.0000 | ORAL_TABLET | Freq: Four times a day (QID) | ORAL | 0 refills | Status: DC | PRN

## 2022-07-22 NOTE — Telephone Encounter (Signed)
Patient called and lvm requesting a refill on his Hydrocodone 5-325 on 2/08, but he knows Dr. Luna Glasgow will not be here that day.  [facility] [place].  Pt's # [phone]

## 2022-08-14 ENCOUNTER — Encounter: Payer: Self-pay | Admitting: Radiology

## 2022-08-19 ENCOUNTER — Encounter: Payer: Self-pay | Admitting: Orthopaedic Surgery

## 2022-08-19 ENCOUNTER — Ambulatory Visit: Payer: Medicare Other | Admitting: Orthopaedic Surgery

## 2022-08-19 VITALS — BP 119/69 | HR 65 | Ht 68.0 in | Wt 218.0 lb

## 2022-08-19 DIAGNOSIS — M25562 Pain in left knee: Secondary | ICD-10-CM

## 2022-08-19 DIAGNOSIS — G8929 Other chronic pain: Secondary | ICD-10-CM | POA: Diagnosis not present

## 2022-08-19 MED ORDER — HYDROCODONE-ACETAMINOPHEN 5-325 MG PO TABS: 1.0000 | ORAL_TABLET | Freq: Four times a day (QID) | ORAL | 0 refills | Status: AC | PRN

## 2022-08-19 NOTE — Progress Notes (Signed)
My knee is sore at times.  His left knee is sore depending on activity and weather.  He has no new trauma.  He is dancing twice a week now and has found it helps.  He has been taking his medicine.  Left knee has near full ROM, crepitus, slight effusion, stable.  NV intact.  No distal edema.  Encounter Diagnosis  Name Primary?   Chronic pain of left knee Yes   I will refill medicine for pain.  I have reviewed the [place] web site prior to prescribing narcotic medicine for this patient.  Return in three months.  Call if any problem.  Precautions discussed.  Electronically Signed Sanjuana Kava, MD 3/5/202410:08 AM

## 2022-09-23 ENCOUNTER — Telehealth: Payer: Self-pay | Admitting: Orthopaedic Surgery

## 2022-09-23 ENCOUNTER — Other Ambulatory Visit: Payer: Self-pay | Admitting: Orthopaedic Surgery

## 2022-09-23 MED ORDER — HYDROCODONE-ACETAMINOPHEN 5-325 MG PO TABS: 1.0000 | ORAL_TABLET | Freq: Four times a day (QID) | ORAL | 0 refills | Status: DC | PRN

## 2022-09-23 NOTE — Telephone Encounter (Signed)
Dr. Sanjuan Dame pt - pt is requesting a refill on Hydrocodone 5-325 to be sent to [facility].

## 2022-10-16 ENCOUNTER — Ambulatory Visit (INDEPENDENT_AMBULATORY_CARE_PROVIDER_SITE_OTHER): Payer: Medicare Other | Admitting: Nurse Practitioner

## 2022-10-16 ENCOUNTER — Encounter: Payer: Self-pay | Admitting: Nurse Practitioner

## 2022-10-16 VITALS — BP 136/80 | HR 61 | Temp 98.2°F | Resp 20 | Ht 68.0 in | Wt 226.0 lb

## 2022-10-16 DIAGNOSIS — Z6834 Body mass index (BMI) 34.0-34.9, adult: Secondary | ICD-10-CM

## 2022-10-16 DIAGNOSIS — F411 Generalized anxiety disorder: Secondary | ICD-10-CM | POA: Diagnosis not present

## 2022-10-16 DIAGNOSIS — E785 Hyperlipidemia, unspecified: Secondary | ICD-10-CM | POA: Diagnosis not present

## 2022-10-16 DIAGNOSIS — Z125 Encounter for screening for malignant neoplasm of prostate: Secondary | ICD-10-CM

## 2022-10-16 DIAGNOSIS — M1A00X0 Idiopathic chronic gout, unspecified site, without tophus (tophi): Secondary | ICD-10-CM | POA: Diagnosis not present

## 2022-10-16 DIAGNOSIS — I10 Essential (primary) hypertension: Secondary | ICD-10-CM

## 2022-10-16 DIAGNOSIS — Z79891 Long term (current) use of opiate analgesic: Secondary | ICD-10-CM | POA: Diagnosis not present

## 2022-10-16 MED ORDER — LISINOPRIL 10 MG PO TABS: 10.0000 mg | ORAL_TABLET | Freq: Every day | ORAL | 1 refills | Status: DC

## 2022-10-16 MED ORDER — ALLOPURINOL 300 MG PO TABS: 300.0000 mg | ORAL_TABLET | Freq: Every day | ORAL | 1 refills | Status: DC

## 2022-10-16 MED ORDER — ATORVASTATIN CALCIUM 40 MG PO TABS: ORAL_TABLET | ORAL | 1 refills | Status: DC

## 2022-10-16 MED ORDER — DIAZEPAM 5 MG PO TABS: 5.0000 mg | ORAL_TABLET | Freq: Two times a day (BID) | ORAL | 5 refills | Status: DC | PRN

## 2022-10-16 NOTE — Patient Instructions (Signed)

## 2022-10-16 NOTE — Addendum Note (Signed)
Addended by: Cleda Daub on: 10/16/2022 11:24 AM   Modules accepted: Orders

## 2022-10-16 NOTE — Progress Notes (Signed)
Subjective:    Patient ID: Andre Hunt, male    DOB: Apr 15, 1961, 62 y.o.   MRN: [id number]   Chief Complaint: medical management of chronic issues     HPI:  Andre Hunt is a 62 y.o. who identifies as a male who was assigned male at birth.   Social history: Lives with: wife Work history: disability   Comes in today for follow up of the following chronic medical issues:  1. Essential hypertension No c/o chest pain, sob or headache. Does not check blood pressure at home. BP Readings from Last 3 Encounters:  08/19/22 119/69  05/20/22 (!) 150/88  04/17/22 112/73     2. Hyperlipidemia with target LDL less than 100 Does not really watch diet and does no dedicated exercise. Lab Results  Component Value Date   CHOL 112 04/17/2022   HDL 39 (L) 04/17/2022   LDLCALC 55 04/17/2022   TRIG 92 04/17/2022   CHOLHDL 2.9 04/17/2022     3. Idiopathic chronic gout without tophus, unspecified site No recent flare ups.  4. GAD (generalized anxiety disorder) Takes valium bid. Says he is doiing ok. Worries a lot about his wifes health    10/16/2022    9:20 AM 04/17/2022    8:40 AM 10/14/2021   10:55 AM 04/16/2021   10:49 AM  GAD 7 : Generalized Anxiety Score  Nervous, Anxious, on Edge 0 0 0 0  Control/stop worrying 0 0 0 0  Worry too much - different things 0 0 0 0  Trouble relaxing 0 0 0 0  Restless 0 0 0 0  Easily annoyed or irritable 0 0 0 0  Afraid - awful might happen 0 0 0 0  Total GAD 7 Score 0 0 0 0  Anxiety Difficulty Not difficult at all Not difficult at all Not difficult at all Not difficult at all      5. BMI 34.0-34.9,adult Weight is up 8lbs Wt Readings from Last 3 Encounters:  10/16/22 226 lb (102.5 kg)  08/19/22 218 lb (98.9 kg)  05/13/22 218 lb (98.9 kg)   BMI Readings from Last 3 Encounters:  10/16/22 34.36 kg/m  08/19/22 33.15 kg/m  05/13/22 33.15 kg/m     New complaints: None today  Allergies  Allergen Reactions   Iohexol  Anaphylaxis    Allergy to IVP dye   Outpatient Encounter Medications as of 10/16/2022  Medication Sig   HYDROcodone-acetaminophen (NORCO/VICODIN) 5-325 MG tablet Take 1 tablet by mouth every 6 (six) hours as needed for moderate pain.   allopurinol (ZYLOPRIM) 300 MG tablet Take 1 tablet (300 mg total) by mouth daily.   atorvastatin (LIPITOR) 40 MG tablet TAKE ONE TABLET ONCE DAILY AT 6PM   cetirizine (ZYRTEC) 10 MG tablet TAKE 1 TABLET DAILY   colchicine 0.6 MG tablet One tid po for five days.   diazepam (VALIUM) 5 MG tablet Take 1 tablet (5 mg total) by mouth every 12 (twelve) hours as needed for anxiety.   hydrocortisone (ANUSOL-HC) 2.5 % rectal cream Place 1 application rectally 2 (two) times daily as needed for hemorrhoids or anal itching.   lisinopril (ZESTRIL) 10 MG tablet Take 1 tablet (10 mg total) by mouth daily.   naproxen (NAPROSYN) 500 MG tablet TAKE 1 TABLET ONCE DAILY WITH MEAL   promethazine (PHENERGAN) 12.5 MG tablet Take 1 tablet (12.5 mg total) by mouth every 8 (eight) hours as needed for nausea or vomiting.   No facility-administered encounter medications on file as of  10/16/2022.    Past Surgical History:  Procedure Laterality Date   APPENDECTOMY  1988   CARPAL TUNNEL RELEASE     bilateral 2000 and 2001   COLONOSCOPY  09/23/2012   ELBOW SURGERY  2006   muscle repair   EXPLORATORY LAPAROTOMY  1996   after falling on scissors; repair of "hole in intestines"   INCISIONAL HERNIA REPAIR  1997   POLYPECTOMY     UMBILICAL HERNIA REPAIR  1968    Family History  Problem Relation Age of Onset   Kidney cancer Mother    Diabetes Mother    COPD Mother    Anuerysm Father    Diabetes Father    Heart disease Father    Diabetes Sister    Colon cancer Neg Hx    Colon polyps Neg Hx    Esophageal cancer Neg Hx    Rectal cancer Neg Hx    Stomach cancer Neg Hx       Controlled substance contract: n/a     Review of Systems  Constitutional:  Negative for diaphoresis.   Eyes:  Negative for pain.  Respiratory:  Negative for shortness of breath.   Cardiovascular:  Negative for chest pain, palpitations and leg swelling.  Gastrointestinal:  Negative for abdominal pain.  Endocrine: Negative for polydipsia.  Skin:  Negative for rash.  Neurological:  Negative for dizziness, weakness and headaches.  Hematological:  Does not bruise/bleed easily.  All other systems reviewed and are negative.      Objective:   Physical Exam Vitals and nursing note reviewed.  Constitutional:      Appearance: Normal appearance. He is well-developed.  HENT:     Head: Normocephalic.     Nose: Nose normal.     Mouth/Throat:     Mouth: Mucous membranes are moist.     Pharynx: Oropharynx is clear.  Eyes:     Pupils: Pupils are equal, round, and reactive to light.  Neck:     Thyroid: No thyroid mass or thyromegaly.     Vascular: No carotid bruit or JVD.     Trachea: Phonation normal.  Cardiovascular:     Rate and Rhythm: Normal rate and regular rhythm.  Pulmonary:     Effort: Pulmonary effort is normal. No respiratory distress.     Breath sounds: Normal breath sounds.  Abdominal:     General: Bowel sounds are normal.     Palpations: Abdomen is soft.     Tenderness: There is no abdominal tenderness.  Musculoskeletal:        General: Normal range of motion.     Cervical back: Normal range of motion and neck supple.  Lymphadenopathy:     Cervical: No cervical adenopathy.  Skin:    General: Skin is warm and dry.  Neurological:     Mental Status: He is alert and oriented to person, place, and time.  Psychiatric:        Behavior: Behavior normal.        Thought Content: Thought content normal.        Judgment: Judgment normal.     BP 136/80   Pulse 61   Temp 98.2 F (36.8 C) (Temporal)   Resp 20   Ht 5\' 8"  (1.727 m)   Wt 226 lb (102.5 kg)   SpO2 96%   BMI 34.36 kg/m        Assessment & Plan:   Andre Hunt comes in today with chief complaint of  Medical Management of  Chronic Issues   Diagnosis and orders addressed:  1. Essential hypertension Low sodium diet - lisinopril (ZESTRIL) 10 MG tablet; Take 1 tablet (10 mg total) by mouth daily.  Dispense: 90 tablet; Refill: 1 - CBC with Differential/Platelet - CMP14+EGFR  2. Hyperlipidemia with target LDL less than 100 Low fat diet - atorvastatin (LIPITOR) 40 MG tablet; TAKE ONE TABLET ONCE DAILY AT 6PM  Dispense: 90 tablet; Refill: 1 - Lipid panel  3. 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  Dispense: 90 tablet; Refill: 1  4. GAD (generalized anxiety disorder) Stress management - diazepam (VALIUM) 5 MG tablet; Take 1 tablet (5 mg total) by mouth every 12 (twelve) hours as needed for anxiety.  Dispense: 60 tablet; Refill: 5  5. BMI 34.0-34.9,adult Discussed diet and exercise for person with BMI >25 Will recheck weight in 3-6 months   6. Prostate cancer screening - PSA, total and free   Labs pending Health Maintenance reviewed Diet and exercise encouraged  Follow up plan: 6 months   Mary-Margaret Daphine Deutscher, FNP

## 2022-10-17 LAB — LIPID PANEL
Chol/HDL Ratio: 2.8 ratio (ref 0.0–5.0)
Cholesterol, Total: 111 mg/dL (ref 100–199)
HDL: 40 mg/dL (ref >39)
LDL Chol Calc (NIH): 50 mg/dL (ref 0–99)
Triglycerides: 113 mg/dL (ref 0–149)
VLDL Cholesterol Cal: 21 mg/dL (ref 5–40)

## 2022-10-17 LAB — CBC WITH DIFFERENTIAL/PLATELET
Basophils Absolute: 0 10*3/uL (ref 0.0–0.2)
Basos: 1 %
EOS (ABSOLUTE): 0.1 10*3/uL (ref 0.0–0.4)
Eos: 2 %
Hematocrit: 45.8 % (ref 37.5–51.0)
Hemoglobin: 14.9 g/dL (ref 13.0–17.7)
Immature Grans (Abs): 0 10*3/uL (ref 0.0–0.1)
Immature Granulocytes: 0 %
Lymphocytes Absolute: 1.9 10*3/uL (ref 0.7–3.1)
Lymphs: 31 %
MCH: 29.7 pg (ref 26.6–33.0)
MCHC: 32.5 g/dL (ref 31.5–35.7)
MCV: 91 fL (ref 79–97)
Monocytes Absolute: 0.6 10*3/uL (ref 0.1–0.9)
Monocytes: 9 %
Neutrophils Absolute: 3.5 10*3/uL (ref 1.4–7.0)
Neutrophils: 57 %
Platelets: 233 10*3/uL (ref 150–450)
RBC: 5.02 x10E6/uL (ref 4.14–5.80)
RDW: 12.8 % (ref 11.6–15.4)
WBC: 6.1 10*3/uL (ref 3.4–10.8)

## 2022-10-17 LAB — CMP14+EGFR
ALT: 38 IU/L (ref 0–44)
AST: 27 IU/L (ref 0–40)
Albumin/Globulin Ratio: 1.7 (ref 1.2–2.2)
Albumin: 4.3 g/dL (ref 3.9–4.9)
Alkaline Phosphatase: 68 IU/L (ref 44–121)
BUN/Creatinine Ratio: 18 (ref 10–24)
BUN: 21 mg/dL (ref 8–27)
Bilirubin Total: 0.4 mg/dL (ref 0.0–1.2)
CO2: 23 mmol/L (ref 20–29)
Calcium: 9.6 mg/dL (ref 8.6–10.2)
Chloride: 102 mmol/L (ref 96–106)
Creatinine, Ser: 1.17 mg/dL (ref 0.76–1.27)
Globulin, Total: 2.5 g/dL (ref 1.5–4.5)
Glucose: 105 mg/dL — ABNORMAL HIGH (ref 70–99)
Potassium: 4.8 mmol/L (ref 3.5–5.2)
Sodium: 141 mmol/L (ref 134–144)
Total Protein: 6.8 g/dL (ref 6.0–8.5)
eGFR: 71 mL/min/{1.73_m2} (ref >59)

## 2022-10-17 LAB — PSA, TOTAL AND FREE
PSA, Free Pct: 49 %
PSA, Free: 0.49 ng/mL
Prostate Specific Ag, Serum: 1 ng/mL (ref 0.0–4.0)

## 2022-10-21 ENCOUNTER — Telehealth: Payer: Self-pay | Admitting: Orthopaedic Surgery

## 2022-10-21 ENCOUNTER — Other Ambulatory Visit: Payer: Self-pay | Admitting: Nurse Practitioner

## 2022-10-21 DIAGNOSIS — I10 Essential (primary) hypertension: Secondary | ICD-10-CM

## 2022-10-21 LAB — TOXASSURE SELECT 13 (MW), URINE

## 2022-10-21 MED ORDER — HYDROCODONE-ACETAMINOPHEN 5-325 MG PO TABS: 1.0000 | ORAL_TABLET | Freq: Four times a day (QID) | ORAL | 0 refills | Status: DC | PRN

## 2022-10-21 NOTE — Telephone Encounter (Signed)
Dr. Sanjuan Dame pt - pt lvm requesting a refill on Hydrocodone 5-325 to be sent to [facility]

## 2022-11-19 ENCOUNTER — Other Ambulatory Visit (INDEPENDENT_AMBULATORY_CARE_PROVIDER_SITE_OTHER): Payer: Medicare Other

## 2022-11-19 ENCOUNTER — Encounter: Payer: Self-pay | Admitting: Orthopaedic Surgery

## 2022-11-19 ENCOUNTER — Ambulatory Visit: Payer: Medicare Other | Admitting: Orthopaedic Surgery

## 2022-11-19 VITALS — BP 128/74 | HR 62 | Ht 68.0 in | Wt 220.0 lb

## 2022-11-19 DIAGNOSIS — M25551 Pain in right hip: Secondary | ICD-10-CM

## 2022-11-19 DIAGNOSIS — M545 Low back pain, unspecified: Secondary | ICD-10-CM | POA: Diagnosis not present

## 2022-11-19 MED ORDER — HYDROCODONE-ACETAMINOPHEN 5-325 MG PO TABS: 1.0000 | ORAL_TABLET | Freq: Four times a day (QID) | ORAL | 0 refills | Status: DC | PRN

## 2022-11-19 MED ORDER — CYCLOBENZAPRINE HCL 10 MG PO TABS: 10.0000 mg | ORAL_TABLET | Freq: Every day | ORAL | 0 refills | Status: AC

## 2022-11-19 MED ORDER — PREDNISONE 5 MG (21) PO TBPK
ORAL_TABLET | ORAL | 0 refills | Status: DC
Start: 2022-11-19 — End: 2022-12-10

## 2022-11-19 NOTE — Progress Notes (Signed)
My right hip hurts.  He has had pain in the right hip area.  It hurts more on long trips and sitting a while.  On closer examination, he has pain going down the back of the right thigh to his right lateral foot.  He denies any trauma, denies any untoward event.  He is taking Naprosyn which helps.  He has full ROM of the right hip.  Lower back is slightly tender right side with no spasm. ROM of back is full.  NV intact.  Muscle tone and strength is normal.  Gait is normal.  X-rays were done of the lumbar spine and right hip, reported separately.  Encounter Diagnoses  Name Primary?   Pain in right hip Yes   Lumbar pain    I have explained his pain is coming from his back.  He was advised to begin exercises for the back, sheet printed.  He is unable to go to PT right now.  I will give flexeril to take at night before bed.  Adjust his seat in car when taking long trips.  Remove wallet from right back pocket.  I will renew his pain medicine.  I have reviewed the [place] Controlled Substance Reporting System web site prior to prescribing narcotic medicine for this patient.  I will give prednisone dose pack.  Return in three weeks.  Call if any problem.  Precautions discussed.  Electronically Signed Darreld Mclean, MD 6/5/20249:41 AM

## 2022-11-19 NOTE — Patient Instructions (Addendum)
Make sure you change wallet to another pocket Use a pillow behind your low back when you travel or use the air seat inflation in the car, also use a small pillow behind you when you are in the recliner   Continue the Naproxen and use the Cyclobenzaprine at bedtime

## 2022-12-02 ENCOUNTER — Telehealth: Payer: Self-pay | Admitting: Nurse Practitioner

## 2022-12-02 MED ORDER — DOXYCYCLINE HYCLATE 100 MG PO TABS: 100.0000 mg | ORAL_TABLET | Freq: Two times a day (BID) | ORAL | 0 refills | Status: DC

## 2022-12-02 NOTE — Telephone Encounter (Signed)
Meds ordered this encounter  Medications   doxycycline (VIBRA-TABS) 100 MG tablet    Sig: Take 1 tablet (100 mg total) by mouth 2 (two) times daily. 1 po bid    Dispense:  28 tablet    Refill:  0    Order Specific Question:   Supervising Provider    Answer:   Nils Pyle [[id number]]   Mary-Margaret Daphine Deutscher, FNP

## 2022-12-02 NOTE — Telephone Encounter (Signed)
Please advise 

## 2022-12-02 NOTE — Telephone Encounter (Signed)
Will need to at least do video appointment

## 2022-12-02 NOTE — Telephone Encounter (Signed)
Patient got tick off 2 weeks ago and is having cold symptoms .Meds sent to [facility] pharmacy per MMM. Patient notified

## 2022-12-02 NOTE — Telephone Encounter (Signed)
Patient said he got a tick off of him and hasnt been feeling well. He had [place] spotted fever a couple of years ago and is afraid he has it again. Offered an appt for 6/19 because we only had video appts left for today (6/18) but he said he was unable to come that day due to having an appt for his wife. Stated that he just needed lab work, made him aware that he would still need an appt. Wants PCP nurse to call him.

## 2022-12-10 ENCOUNTER — Encounter: Payer: Self-pay | Admitting: Orthopaedic Surgery

## 2022-12-10 ENCOUNTER — Ambulatory Visit: Payer: Medicare Other | Admitting: Orthopaedic Surgery

## 2022-12-10 ENCOUNTER — Other Ambulatory Visit (INDEPENDENT_AMBULATORY_CARE_PROVIDER_SITE_OTHER): Payer: Medicare Other

## 2022-12-10 ENCOUNTER — Other Ambulatory Visit: Payer: Self-pay | Admitting: Orthopaedic Surgery

## 2022-12-10 VITALS — Ht 68.0 in | Wt 220.0 lb

## 2022-12-10 DIAGNOSIS — G8929 Other chronic pain: Secondary | ICD-10-CM

## 2022-12-10 DIAGNOSIS — M25561 Pain in right knee: Secondary | ICD-10-CM

## 2022-12-10 DIAGNOSIS — M545 Low back pain, unspecified: Secondary | ICD-10-CM

## 2022-12-10 MED ORDER — METHYLPREDNISOLONE ACETATE 40 MG/ML IJ SUSP
40.0000 mg | Freq: Once | INTRAMUSCULAR | Status: AC
  Administered 2022-12-10: 40 mg via INTRA_ARTICULAR

## 2022-12-10 NOTE — Progress Notes (Signed)
My back is better but my knee hurts.  He has much less pain in his back after the prednisone dose pack.  He has no weakness, no numbness, no new trauma.  He has had pain in the right knee more medially and popping.  He has put on a knee brace.  He has swelling but no redness, no giving way.  He has no trauma.  Spine/Pelvis examination:  Inspection:  Overall, sacoiliac joint benign and hips nontender; without crepitus or defects.   Thoracic spine inspection: Alignment normal without kyphosis present   Lumbar spine inspection:  Alignment  with normal lumbar lordosis, without scoliosis apparent.   Thoracic spine palpation:  without tenderness of spinal processes   Lumbar spine palpation: without tenderness of lumbar area; without tightness of lumbar muscles    Range of Motion:   Lumbar flexion, forward flexion is normal without pain or tenderness    Lumbar extension is full without pain or tenderness   Left lateral bend is normal without pain or tenderness   Right lateral bend is normal without pain or tenderness   Straight leg raising is normal  Strength & tone: normal   Stability overall normal stability  Right knee has slight effusion, crepitus, ROM 0 to 105, medial pain, slightly positive medial Mc[place]ray, limp right, NV intact, no distal edema.  X-rays were done of the right knee, reported separately.  Encounter Diagnoses  Name Primary?   Chronic pain of right knee Yes   Lumbar pain    PROCEDURE NOTE:  The patient requests injections of the right knee , verbal consent was obtained.  The right knee was prepped appropriately after time out was performed.   Sterile technique was observed and injection of 1 cc of DepoMedrol 40mg  with several cc's of plain xylocaine. Anesthesia was provided by ethyl chloride and a 20-gauge needle was used to inject the knee area. The injection was tolerated well.  A band aid dressing was applied.  The patient was advised to apply ice later  today and tomorrow to the injection sight as needed.  I will see him in two weeks.  He may need MRI of the right knee.  Call if any problem.  Precautions discussed.  Electronically Signed Darreld Mclean, MD 6/26/20249:06 AM

## 2022-12-24 ENCOUNTER — Ambulatory Visit: Payer: Medicare Other | Admitting: Orthopaedic Surgery

## 2022-12-24 ENCOUNTER — Encounter: Payer: Self-pay | Admitting: Orthopaedic Surgery

## 2022-12-24 VITALS — BP 125/79 | HR 64 | Ht 68.0 in

## 2022-12-24 DIAGNOSIS — G8929 Other chronic pain: Secondary | ICD-10-CM

## 2022-12-24 DIAGNOSIS — M25561 Pain in right knee: Secondary | ICD-10-CM | POA: Diagnosis not present

## 2022-12-24 MED ORDER — HYDROCODONE-ACETAMINOPHEN 5-325 MG PO TABS: 1.0000 | ORAL_TABLET | Freq: Four times a day (QID) | ORAL | 0 refills | Status: DC | PRN

## 2022-12-24 NOTE — Progress Notes (Signed)
I am better.  His right knee has less pain after the injection last time.  He has no swelling today and normal gait.  NV intact.  It is stable.  Encounter Diagnosis  Name Primary?   Chronic pain of right knee Yes   Return in three months.  I have reviewed the [place] Controlled Substance Reporting System web site prior to prescribing narcotic medicine for this patient.  Call if any problem.  Precautions discussed.  Electronically Signed Darreld Mclean, MD 7/10/20248:50 AM

## 2023-01-22 ENCOUNTER — Telehealth: Payer: Self-pay | Admitting: Orthopaedic Surgery

## 2023-01-22 MED ORDER — HYDROCODONE-ACETAMINOPHEN 5-325 MG PO TABS: 1.0000 | ORAL_TABLET | Freq: Four times a day (QID) | ORAL | 0 refills | Status: DC | PRN

## 2023-01-22 NOTE — Telephone Encounter (Signed)
Dr. Sanjuan Dame pt - pt called, requesting a refill on Hydrocodone 5-325 to be sent to [facility], he stated that it's due on 01/24/23

## 2023-01-26 ENCOUNTER — Telehealth: Payer: Self-pay | Admitting: Nurse Practitioner

## 2023-01-26 NOTE — Telephone Encounter (Signed)
Appt made

## 2023-01-27 ENCOUNTER — Encounter: Payer: Self-pay | Admitting: Nurse Practitioner

## 2023-01-27 ENCOUNTER — Ambulatory Visit (INDEPENDENT_AMBULATORY_CARE_PROVIDER_SITE_OTHER): Payer: Medicare Other | Admitting: Nurse Practitioner

## 2023-01-27 VITALS — BP 122/82 | HR 60 | Temp 98.1°F | Resp 20 | Ht 68.0 in | Wt 223.0 lb

## 2023-01-27 DIAGNOSIS — J301 Allergic rhinitis due to pollen: Secondary | ICD-10-CM | POA: Diagnosis not present

## 2023-01-27 MED ORDER — METHYLPREDNISOLONE ACETATE 80 MG/ML IJ SUSP
80.0000 mg | Freq: Once | INTRAMUSCULAR | Status: AC
Start: 2023-01-27 — End: 2023-01-27
  Administered 2023-01-27: 80 mg via INTRAMUSCULAR

## 2023-01-27 MED ORDER — FLUTICASONE PROPIONATE 50 MCG/ACT NA SUSP
2.0000 | Freq: Every day | NASAL | 6 refills | Status: AC
Start: 2023-01-27 — End: ?

## 2023-01-27 MED ORDER — LEVOCETIRIZINE DIHYDROCHLORIDE 5 MG PO TABS
5.0000 mg | ORAL_TABLET | Freq: Every evening | ORAL | 1 refills | Status: DC
Start: 2023-01-27 — End: 2023-07-07

## 2023-01-27 NOTE — Patient Instructions (Signed)
Allergic Rhinitis, Adult  Allergic rhinitis is a reaction to allergens. Allergens are things that can cause an allergic reaction. This condition affects the lining inside the nose (mucous membrane). There are two types of allergic rhinitis: Seasonal. This type is also called hay fever. It happens only during some times of the year. Perennial. This type can happen at any time of the year. This condition cannot be spread from person to person (is not contagious). It can be mild, bad, or very bad. It can develop at any age and may be outgrown. What are the causes? Pollen from grasses, trees, and weeds. Other causes can be: Dust mites. Smoke. Mold. Car fumes. The pee (urine), spit, or dander of pets. Dander is dead skin cells from a pet. What increases the risk? You are more likely to develop this condition if: You have allergies in your family. You have problems like allergies in your family. You may have: Swelling of parts of your eyes and eyelids. Asthma. This affects how you breathe. Long-term redness and swelling on your skin. Food allergies. What are the signs or symptoms? The main symptom of this condition is a runny or stuffy nose (nasal congestion). Other symptoms may include: Sneezing or coughing. Itching and tearing of your eyes. Mucus that drips down the back of your throat (postnasal drip). This may cause a sore throat. Trouble sleeping. Feeling tired. Headache. How is this treated? There is no cure for this condition. You should avoid things that you are allergic to. Treatment can help to relieve symptoms. This may include: Medicines that block allergy symptoms, such as anti-inflammatories or antihistamines. These may be given as a shot, nasal spray, or pill. Avoiding things you are allergic to. Medicines that give you some of what you are allergic to over time. This is called immunotherapy. It is done if other treatments do not help. You may get: Shots. Medicine under  your tongue. Stronger medicines, if other treatments do not help. Follow these instructions at home: Avoiding allergens Find out what things you are allergic to and avoid them. To do this, try these things: If you get allergies any time of year: Replace carpet with wood, tile, or vinyl flooring. Carpet can trap pet dander and dust. Do not smoke. Do not allow smoking in your home. Change your heating and air conditioning filters at least once a month. If you get allergies only some times of the year: Keep windows closed when you can. Plan things to do outside when pollen counts are lowest. Check pollen counts before you plan things to do outside. When you come indoors, change your clothes and shower before you sit on furniture or bedding. If you are allergic to a pet: Keep the pet out of your bedroom. Vacuum, sweep, and dust often. General instructions Take over-the-counter and prescription medicines only as told by your doctor. Drink enough fluid to keep your pee pale yellow. Where to find more information American Academy of Allergy, Asthma & Immunology: aaaai.org Contact a doctor if: You have a fever. You get a cough that does not go away. You make high-pitched whistling sounds when you breathe, most often when you breathe out (wheeze). Your symptoms slow you down. Your symptoms stop you from doing your normal things each day. Get help right away if: You are short of breath. This symptom may be an emergency. Get help right away. Call 911. Do not wait to see if the symptom will go away. Do not drive yourself to the   hospital. This information is not intended to replace advice given to you by your health care provider. Make sure you discuss any questions you have with your health care provider. Document Revised: 02/10/2022 Document Reviewed: 02/10/2022 Elsevier Patient Education  2024 Elsevier Inc.  

## 2023-01-27 NOTE — Progress Notes (Signed)
   Subjective:    Patient ID: Andre Hunt, male    DOB: May 01, 1961, 63 y.o.   MRN: [id number]   Chief Complaint: Allergies (Taking 3-4 claritin daily//)   HPI  Patient says allergies started about 2 weeks ago. He has been taking 3-4 clartin a day congestion and sneezing. Is getting no relief. Patient Active Problem List   Diagnosis Date Noted   GAD (generalized anxiety disorder) 04/18/2019   BMI 34.0-34.9,adult 03/11/2016   Primary gout 12/13/2014   Essential hypertension 12/13/2014   Internal hemorrhoids with bleeding and seepage 08/10/2014   Hyperlipidemia with target LDL less than 100 03/24/2014   Personal history of colonic adenoma 09/28/2012       Review of Systems  Constitutional:  Negative for diaphoresis.  HENT:  Positive for rhinorrhea and sneezing.   Eyes:  Negative for pain.  Respiratory:  Negative for shortness of breath.   Cardiovascular:  Negative for chest pain, palpitations and leg swelling.  Gastrointestinal:  Negative for abdominal pain.  Endocrine: Negative for polydipsia.  Skin:  Negative for rash.  Neurological:  Negative for dizziness, weakness and headaches.  Hematological:  Does not bruise/bleed easily.  All other systems reviewed and are negative.      Objective:   Physical Exam Vitals reviewed.  Constitutional:      Appearance: Normal appearance.  HENT:     Right Ear: Tympanic membrane normal.     Left Ear: Tympanic membrane normal.     Nose: Congestion and rhinorrhea present.  Cardiovascular:     Rate and Rhythm: Normal rate and regular rhythm.     Heart sounds: Normal heart sounds.  Pulmonary:     Breath sounds: Normal breath sounds.  Neurological:     General: No focal deficit present.     Mental Status: He is alert and oriented to person, place, and time.  Psychiatric:        Mood and Affect: Mood normal.        Behavior: Behavior normal.    BP 122/82   Pulse 60   Temp 98.1 F (36.7 C) (Temporal)   Resp 20   Ht 5\' 8"   (1.727 m)   Wt 223 lb (101.2 kg)   SpO2 95%   BMI 33.91 kg/m         Assessment & Plan:   Andre Hunt in today with chief complaint of Allergies (Taking 3-4 claritin daily//)   1. Seasonal allergic rhinitis due to pollen Stop claritin - fluticasone (FLONASE) 50 MCG/ACT nasal spray; Place 2 sprays into both nostrils daily.  Dispense: 16 g; Refill: 6 - levocetirizine (XYZAL) 5 MG tablet; Take 1 tablet (5 mg total) by mouth every evening.  Dispense: 90 tablet; Refill: 1 - methylPREDNISolone acetate (DEPO-MEDROL) injection 80 mg    The above assessment and management plan was discussed with the patient. The patient verbalized understanding of and has agreed to the management plan. Patient is aware to call the clinic if symptoms persist or worsen. Patient is aware when to return to the clinic for a follow-up visit. Patient educated on when it is appropriate to go to the emergency department.   Mary-Margaret Daphine Deutscher, FNP

## 2023-02-02 DIAGNOSIS — M545 Low back pain, unspecified: Secondary | ICD-10-CM | POA: Diagnosis not present

## 2023-02-02 DIAGNOSIS — S3992XA Unspecified injury of lower back, initial encounter: Secondary | ICD-10-CM | POA: Diagnosis not present

## 2023-02-02 DIAGNOSIS — M5126 Other intervertebral disc displacement, lumbar region: Secondary | ICD-10-CM | POA: Diagnosis not present

## 2023-02-02 DIAGNOSIS — M7918 Myalgia, other site: Secondary | ICD-10-CM | POA: Diagnosis not present

## 2023-02-02 DIAGNOSIS — M5136 Other intervertebral disc degeneration, lumbar region: Secondary | ICD-10-CM | POA: Diagnosis not present

## 2023-03-25 ENCOUNTER — Encounter: Payer: Self-pay | Admitting: Orthopaedic Surgery

## 2023-03-25 ENCOUNTER — Ambulatory Visit: Payer: Medicare Other | Admitting: Orthopaedic Surgery

## 2023-03-25 VITALS — BP 133/79 | HR 67 | Ht 68.0 in | Wt 224.0 lb

## 2023-03-25 DIAGNOSIS — M25561 Pain in right knee: Secondary | ICD-10-CM | POA: Diagnosis not present

## 2023-03-25 DIAGNOSIS — G8929 Other chronic pain: Secondary | ICD-10-CM

## 2023-03-25 MED ORDER — HYDROCODONE-ACETAMINOPHEN 5-325 MG PO TABS: 1.0000 | ORAL_TABLET | Freq: Four times a day (QID) | ORAL | 0 refills | Status: DC | PRN

## 2023-03-25 NOTE — Progress Notes (Signed)
My knee is sore  His right knee has more pain with the cooler mornings but he has no new trauma.  He has effusion and popping but no giving way. He uses his brace.  Right knee has slight effusion, crepitus, ROM 0 to 110, stable, NV intact, no distal edema.  Encounter Diagnosis  Name Primary?   Chronic pain of right knee Yes   I will renew his pain medicine.  I have reviewed the [place] Controlled Substance Reporting System web site prior to prescribing narcotic medicine for this patient.  Return in three months.  Call if any problem.  Precautions discussed.  Electronically Signed Darreld Mclean, MD 10/9/20249:01 AM

## 2023-04-17 ENCOUNTER — Encounter: Payer: Self-pay | Admitting: Nurse Practitioner

## 2023-04-17 ENCOUNTER — Ambulatory Visit (INDEPENDENT_AMBULATORY_CARE_PROVIDER_SITE_OTHER): Payer: Medicare Other | Admitting: Nurse Practitioner

## 2023-04-17 VITALS — BP 127/78 | HR 68 | Temp 98.3°F | Resp 20 | Ht 68.0 in | Wt 224.0 lb

## 2023-04-17 DIAGNOSIS — F411 Generalized anxiety disorder: Secondary | ICD-10-CM

## 2023-04-17 DIAGNOSIS — M1A00X0 Idiopathic chronic gout, unspecified site, without tophus (tophi): Secondary | ICD-10-CM

## 2023-04-17 DIAGNOSIS — E785 Hyperlipidemia, unspecified: Secondary | ICD-10-CM

## 2023-04-17 DIAGNOSIS — Z6834 Body mass index (BMI) 34.0-34.9, adult: Secondary | ICD-10-CM

## 2023-04-17 DIAGNOSIS — R112 Nausea with vomiting, unspecified: Secondary | ICD-10-CM | POA: Diagnosis not present

## 2023-04-17 DIAGNOSIS — I10 Essential (primary) hypertension: Secondary | ICD-10-CM

## 2023-04-17 MED ORDER — LISINOPRIL 10 MG PO TABS
10.0000 mg | ORAL_TABLET | Freq: Every day | ORAL | 1 refills | Status: DC
Start: 2023-04-17 — End: 2023-10-22

## 2023-04-17 MED ORDER — DIAZEPAM 5 MG PO TABS
5.0000 mg | ORAL_TABLET | Freq: Two times a day (BID) | ORAL | 5 refills | Status: DC | PRN
Start: 2023-04-17 — End: 2023-10-22

## 2023-04-17 MED ORDER — PROMETHAZINE HCL 12.5 MG PO TABS
12.5000 mg | ORAL_TABLET | Freq: Three times a day (TID) | ORAL | 0 refills | Status: DC | PRN
Start: 2023-04-17 — End: 2023-10-22

## 2023-04-17 MED ORDER — ATORVASTATIN CALCIUM 40 MG PO TABS
ORAL_TABLET | ORAL | 1 refills | Status: DC
Start: 2023-04-17 — End: 2024-01-11

## 2023-04-17 NOTE — Patient Instructions (Signed)

## 2023-04-17 NOTE — Progress Notes (Signed)
Subjective:    Patient ID: Andre Hunt, male    DOB: 1960/12/27, 62 y.o.   MRN: [id number]   Chief Complaint: Medical Management of Chronic Issues    HPI:  Andre Hunt is a 62 y.o. who identifies as a male who was assigned male at birth.   Social history: Lives with: wife Work history: disability   Comes in today for follow up of the following chronic medical issues:  1. Essential hypertension No c/o chest pain, sob or headache. Does not check blood pressure at home. BP Readings from Last 3 Encounters:  04/17/23 127/78  03/25/23 133/79  01/27/23 122/82     2. Hyperlipidemia with target LDL less than 100 Does not watch diet and does no dedicated exercise. Lab Results  Component Value Date   CHOL 111 10/16/2022   HDL 40 10/16/2022   LDLCALC 50 10/16/2022   TRIG 113 10/16/2022   CHOLHDL 2.8 10/16/2022     3. GAD (generalized anxiety disorder) Is on valium BID. Stays anxious.    04/17/2023    9:43 AM 01/27/2023   10:17 AM 10/16/2022    9:20 AM 04/17/2022    8:40 AM  GAD 7 : Generalized Anxiety Score  Nervous, Anxious, on Edge 0 0 0 0  Control/stop worrying 0 0 0 0  Worry too much - different things 0 0 0 0  Trouble relaxing 0 0 0 0  Restless 0 0 0 0  Easily annoyed or irritable 0 0 0 0  Afraid - awful might happen 0 0 0 0  Total GAD 7 Score 0 0 0 0  Anxiety Difficulty Not difficult at all Not difficult at all Not difficult at all Not difficult at all       04/17/2023    9:42 AM 01/27/2023   10:16 AM 10/16/2022    9:20 AM  Depression screen PHQ 2/9  Decreased Interest 0 0 0  Down, Depressed, Hopeless 0 0 0  PHQ - 2 Score 0 0 0  Altered sleeping 0 0 0  Tired, decreased energy 0 0 0  Change in appetite 0 0 0  Feeling bad or failure about yourself  0 0 0  Trouble concentrating 0 0 0  Moving slowly or fidgety/restless 0 0 0  Suicidal thoughts 0 0 0  PHQ-9 Score 0 0 0  Difficult doing work/chores Not difficult at all Not difficult at all Not  difficult at all     4. Idiopathic chronic gout without tophus, unspecified site No recent flare ups  5. BMI 34.0-34.9,adult Wt Readings from Last 3 Encounters:  04/17/23 224 lb (101.6 kg)  03/25/23 224 lb (101.6 kg)  01/27/23 223 lb (101.2 kg)   BMI Readings from Last 3 Encounters:  04/17/23 34.06 kg/m  03/25/23 34.06 kg/m  01/27/23 33.91 kg/m      New complaints: None today  Allergies  Allergen Reactions   Iohexol Anaphylaxis    Allergy to IVP dye   Outpatient Encounter Medications as of 04/17/2023  Medication Sig   atorvastatin (LIPITOR) 40 MG tablet TAKE ONE TABLET ONCE DAILY AT 6PM   cyclobenzaprine (FLEXERIL) 10 MG tablet Take 1 tablet (10 mg total) by mouth at bedtime. One tablet every night at bedtime as needed for spasm.   diazepam (VALIUM) 5 MG tablet Take 1 tablet (5 mg total) by mouth every 12 (twelve) hours as needed for anxiety.   fluticasone (FLONASE) 50 MCG/ACT nasal spray Place 2 sprays into both nostrils daily.  HYDROcodone-acetaminophen (NORCO/VICODIN) 5-325 MG tablet Take 1 tablet by mouth every 6 (six) hours as needed for moderate pain.   hydrocortisone (ANUSOL-HC) 2.5 % rectal cream Place 1 application rectally 2 (two) times daily as needed for hemorrhoids or anal itching.   levocetirizine (XYZAL) 5 MG tablet Take 1 tablet (5 mg total) by mouth every evening.   lisinopril (ZESTRIL) 10 MG tablet Take 1 tablet (10 mg total) by mouth daily.   naproxen (NAPROSYN) 500 MG tablet TAKE 1 TABLET ONCE DAILY WITH MEAL   promethazine (PHENERGAN) 12.5 MG tablet Take 1 tablet (12.5 mg total) by mouth every 8 (eight) hours as needed for nausea or vomiting. (Patient not taking: Reported on 04/17/2023)   [DISCONTINUED] allopurinol (ZYLOPRIM) 300 MG tablet Take 1 tablet (300 mg total) by mouth daily.   [DISCONTINUED] cetirizine (ZYRTEC) 10 MG tablet TAKE 1 TABLET DAILY   [DISCONTINUED] colchicine 0.6 MG tablet One tid po for five days.   No facility-administered  encounter medications on file as of 04/17/2023.    Past Surgical History:  Procedure Laterality Date   APPENDECTOMY  1988   CARPAL TUNNEL RELEASE     bilateral 2000 and 2001   COLONOSCOPY  09/23/2012   ELBOW SURGERY  2006   muscle repair   EXPLORATORY LAPAROTOMY  1996   after falling on scissors; repair of "hole in intestines"   INCISIONAL HERNIA REPAIR  1997   POLYPECTOMY     UMBILICAL HERNIA REPAIR  1968    Family History  Problem Relation Age of Onset   Kidney cancer Mother    Diabetes Mother    COPD Mother    Anuerysm Father    Diabetes Father    Heart disease Father    Diabetes Sister    Colon cancer Neg Hx    Colon polyps Neg Hx    Esophageal cancer Neg Hx    Rectal cancer Neg Hx    Stomach cancer Neg Hx       Controlled substance contract: sees Dr. Hilda Lias for pain     Review of Systems  Constitutional:  Negative for diaphoresis.  Eyes:  Negative for pain.  Respiratory:  Negative for shortness of breath.   Cardiovascular:  Negative for chest pain, palpitations and leg swelling.  Gastrointestinal:  Negative for abdominal pain.  Endocrine: Negative for polydipsia.  Skin:  Negative for rash.  Neurological:  Negative for dizziness, weakness and headaches.  Hematological:  Does not bruise/bleed easily.  All other systems reviewed and are negative.      Objective:   Physical Exam Vitals and nursing note reviewed.  Constitutional:      Appearance: Normal appearance. He is well-developed.  HENT:     Head: Normocephalic.     Nose: Nose normal.     Mouth/Throat:     Mouth: Mucous membranes are moist.     Pharynx: Oropharynx is clear.  Eyes:     Pupils: Pupils are equal, round, and reactive to light.  Neck:     Thyroid: No thyroid mass or thyromegaly.     Vascular: No carotid bruit or JVD.     Trachea: Phonation normal.  Cardiovascular:     Rate and Rhythm: Normal rate and regular rhythm.  Pulmonary:     Effort: Pulmonary effort is normal. No  respiratory distress.     Breath sounds: Normal breath sounds.  Abdominal:     General: Bowel sounds are normal.     Palpations: Abdomen is soft.  Tenderness: There is no abdominal tenderness.  Musculoskeletal:        General: Normal range of motion.     Cervical back: Normal range of motion and neck supple.  Lymphadenopathy:     Cervical: No cervical adenopathy.  Skin:    General: Skin is warm and dry.  Neurological:     Mental Status: He is alert and oriented to person, place, and time.  Psychiatric:        Behavior: Behavior normal.        Thought Content: Thought content normal.        Judgment: Judgment normal.     BP 127/78   Pulse 68   Temp 98.3 F (36.8 C) (Temporal)   Resp 20   Ht 5\' 8"  (1.727 m)   Wt 224 lb (101.6 kg)   SpO2 96%   BMI 34.06 kg/m        Assessment & Plan:  Andre Hunt comes in today with chief complaint of Medical Management of Chronic Issues   Diagnosis and orders addressed:  1. Essential hypertension Low sodium diet - lisinopril (ZESTRIL) 10 MG tablet; Take 1 tablet (10 mg total) by mouth daily.  Dispense: 90 tablet; Refill: 1 - CBC with Differential/Platelet - CMP14+EGFR  2. Hyperlipidemia with target LDL less than 100 Low fat diet - atorvastatin (LIPITOR) 40 MG tablet; TAKE ONE TABLET ONCE DAILY AT 6PM  Dispense: 90 tablet; Refill: 1 - Lipid panel  3. GAD (generalized anxiety disorder) Stress management - diazepam (VALIUM) 5 MG tablet; Take 1 tablet (5 mg total) by mouth every 12 (twelve) hours as needed for anxiety.  Dispense: 60 tablet; Refill: 5  4. Idiopathic chronic gout without tophus, unspecified site   5. BMI 34.0-34.9,adult Discussed diet and exercise for person with BMI >25 Will recheck weight in 3-6 months   6. Non-intractable vomiting with nausea Take meds after eating - promethazine (PHENERGAN) 12.5 MG tablet; Take 1 tablet (12.5 mg total) by mouth every 8 (eight) hours as needed for nausea or  vomiting.  Dispense: 20 tablet; Refill: 0   Labs pending Health Maintenance reviewed Diet and exercise encouraged  Follow up plan: 6 months   Mary-Margaret Daphine Deutscher, FNP

## 2023-04-27 ENCOUNTER — Telehealth: Payer: Self-pay | Admitting: Orthopaedic Surgery

## 2023-04-27 MED ORDER — HYDROCODONE-ACETAMINOPHEN 5-325 MG PO TABS: 1.0000 | ORAL_TABLET | Freq: Four times a day (QID) | ORAL | 0 refills | Status: DC | PRN

## 2023-04-27 NOTE — Telephone Encounter (Signed)
Dr. Sanjuan Dame pt - spoke w/the patient, he is requesting a refill on Hydrocodone 5-325 to be sent to [facility]

## 2023-05-18 ENCOUNTER — Ambulatory Visit (INDEPENDENT_AMBULATORY_CARE_PROVIDER_SITE_OTHER): Payer: Medicare Other

## 2023-05-18 VITALS — Ht 68.0 in | Wt 224.0 lb

## 2023-05-18 DIAGNOSIS — Z0000 Encounter for general adult medical examination without abnormal findings: Secondary | ICD-10-CM

## 2023-05-18 NOTE — Progress Notes (Signed)
Subjective:   Andre Hunt is a 62 y.o. male who presents for Medicare Annual/Subsequent preventive examination.  Visit Complete: Virtual I connected with  Mila Homer on 05/18/23 by a audio enabled telemedicine application and verified that I am speaking with the correct person using two identifiers.  Patient Location: Home  Provider Location: Home Office  I discussed the limitations of evaluation and management by telemedicine. The patient expressed understanding and agreed to proceed.  Vital Signs: Because this visit was a virtual/telehealth visit, some criteria may be missing or patient reported. Any vitals not documented were not able to be obtained and vitals that have been documented are patient reported.    Cardiac Risk Factors include: advanced age (>63men, >68 women);male gender;hypertension;sedentary lifestyle     Objective:    Today's Vitals   05/18/23 0947  Weight: 224 lb (101.6 kg)  Height: 5\' 8"  (1.727 m)   Body mass index is 34.06 kg/m.     05/18/2023    9:50 AM 05/13/2022   12:04 PM 04/24/2021    1:35 PM  Advanced Directives  Does Patient Have a Medical Advance Directive? No No No  Would patient like information on creating a medical advance directive? Yes (MAU/Ambulatory/Procedural Areas - Information given) No - Patient declined No - Patient declined    Current Medications (verified) Outpatient Encounter Medications as of 05/18/2023  Medication Sig   atorvastatin (LIPITOR) 40 MG tablet TAKE ONE TABLET ONCE DAILY AT 6PM   cyclobenzaprine (FLEXERIL) 10 MG tablet Take 1 tablet (10 mg total) by mouth at bedtime. One tablet every night at bedtime as needed for spasm.   diazepam (VALIUM) 5 MG tablet Take 1 tablet (5 mg total) by mouth every 12 (twelve) hours as needed for anxiety.   fluticasone (FLONASE) 50 MCG/ACT nasal spray Place 2 sprays into both nostrils daily.   HYDROcodone-acetaminophen (NORCO/VICODIN) 5-325 MG tablet Take 1 tablet by mouth  every 6 (six) hours as needed for moderate pain (pain score 4-6).   hydrocortisone (ANUSOL-HC) 2.5 % rectal cream Place 1 application rectally 2 (two) times daily as needed for hemorrhoids or anal itching.   levocetirizine (XYZAL) 5 MG tablet Take 1 tablet (5 mg total) by mouth every evening.   lisinopril (ZESTRIL) 10 MG tablet Take 1 tablet (10 mg total) by mouth daily.   naproxen (NAPROSYN) 500 MG tablet TAKE 1 TABLET ONCE DAILY WITH MEAL   promethazine (PHENERGAN) 12.5 MG tablet Take 1 tablet (12.5 mg total) by mouth every 8 (eight) hours as needed for nausea or vomiting.   No facility-administered encounter medications on file as of 05/18/2023.    Allergies (verified) Iohexol   History: Past Medical History:  Diagnosis Date   Allergy    Arthritis    Gout    Hyperlipidemia    Hypertension    Personal history of colonic adenoma 09/28/2012   Past Surgical History:  Procedure Laterality Date   APPENDECTOMY  1988   CARPAL TUNNEL RELEASE     bilateral 2000 and 2001   COLONOSCOPY  09/23/2012   ELBOW SURGERY  2006   muscle repair   EXPLORATORY LAPAROTOMY  1996   after falling on scissors; repair of "hole in intestines"   INCISIONAL HERNIA REPAIR  1997   POLYPECTOMY     UMBILICAL HERNIA REPAIR  1968   Family History  Problem Relation Age of Onset   Kidney cancer Mother    Diabetes Mother    COPD Mother    Anuerysm Father  Diabetes Father    Heart disease Father    Diabetes Sister    Colon cancer Neg Hx    Colon polyps Neg Hx    Esophageal cancer Neg Hx    Rectal cancer Neg Hx    Stomach cancer Neg Hx    Social History   Socioeconomic History   Marital status: Married    Spouse name: Not on file   Number of children: Not on file   Years of education: Not on file   Highest education level: Not on file  Occupational History   Occupation: disability/ partial  Tobacco Use   Smoking status: Never   Smokeless tobacco: Current    Types: Snuff  Vaping Use   Vaping  status: Never Used  Substance and Sexual Activity   Alcohol use: No   Drug use: No   Sexual activity: Not on file  Other Topics Concern   Not on file  Social History Narrative   Not on file   Social Determinants of Health   Financial Resource Strain: Low Risk  (05/18/2023)   Overall Financial Resource Strain (CARDIA)    Difficulty of Paying Living Expenses: Not hard at all  Food Insecurity: No Food Insecurity (05/18/2023)   Hunger Vital Sign    Worried About Running Out of Food in the Last Year: Never true    Ran Out of Food in the Last Year: Never true  Transportation Needs: No Transportation Needs (05/18/2023)   PRAPARE - Administrator, Civil Service (Medical): No    Lack of Transportation (Non-Medical): No  Physical Activity: Insufficiently Active (05/18/2023)   Exercise Vital Sign    Days of Exercise per Week: 3 days    Minutes of Exercise per Session: 30 min  Stress: No Stress Concern Present (05/18/2023)   [employer] of Occupational Health - Occupational Stress Questionnaire    Feeling of Stress : Not at all  Social Connections: Socially Isolated (05/18/2023)   Social Connection and Isolation Panel [NHANES]    Frequency of Communication with Friends and Family: More than three times a week    Frequency of Social Gatherings with Friends and Family: Three times a week    Attends Religious Services: Never    Active Member of Clubs or Organizations: No    Attends Engineer, structural: Never    Marital Status: Divorced    Tobacco Counseling Ready to quit: Not Answered Counseling given: Not Answered   Clinical Intake:  Pre-visit preparation completed: Yes  Pain : No/denies pain     Diabetes: No  How often do you need to have someone help you when you read instructions, pamphlets, or other written materials from your doctor or pharmacy?: 1 - Never  Interpreter Needed?: No  Information entered by :: Kandis Fantasia LPN   Activities of  Daily Living    05/18/2023    9:47 AM  In your present state of health, do you have any difficulty performing the following activities:  Hearing? 0  Vision? 0  Difficulty concentrating or making decisions? 0  Walking or climbing stairs? 0  Dressing or bathing? 0  Doing errands, shopping? 0  Preparing Food and eating ? N  Using the Toilet? N  In the past six months, have you accidently leaked urine? N  Do you have problems with loss of bowel control? N  Managing your Medications? N  Managing your Finances? N  Housekeeping or managing your Housekeeping? N    Patient Care  Team: Bennie Pierini, FNP as PCP - General (Nurse Practitioner) Darreld Mclean, MD as Attending Physician (Orthopedic Surgery) Iva Boop, MD as Consulting Physician (Gastroenterology)  Indicate any recent Medical Services you may have received from other than [facility] providers in the past year (date may be approximate).     Assessment:   This is a routine wellness examination for [place].  Hearing/Vision screen Hearing Screening - Comments:: Denies hearing difficulties   Vision Screening - Comments:: No vision problems; will schedule routine eye exam soon      Goals Addressed   None   Depression Screen    05/18/2023    9:49 AM 04/17/2023    9:42 AM 01/27/2023   10:16 AM 10/16/2022    9:20 AM 05/13/2022   12:03 PM 04/17/2022    8:40 AM 10/14/2021   10:55 AM  PHQ 2/9 Scores  PHQ - 2 Score 0 0 0 0 0 0 0  PHQ- 9 Score  0 0 0 0 0 0    Fall Risk    05/18/2023    9:53 AM 04/17/2023    9:42 AM 01/27/2023   10:16 AM 10/16/2022    9:20 AM 05/13/2022   12:01 PM  Fall Risk   Falls in the past year? 0 0 0 0 0  Number falls in past yr: 0    0  Injury with Fall? 0    0  Risk for fall due to : Impaired mobility    No Fall Risks  Follow up Falls prevention discussed;Education provided;Falls evaluation completed    Falls prevention discussed    MEDICARE RISK AT HOME: Medicare Risk at Home Any stairs in  or around the home?: No If so, are there any without handrails?: No Home free of loose throw rugs in walkways, pet beds, electrical cords, etc?: Yes Adequate lighting in your home to reduce risk of falls?: Yes Life alert?: No Use of a cane, walker or w/c?: No Grab bars in the bathroom?: Yes Shower chair or bench in shower?: No Elevated toilet seat or a handicapped toilet?: Yes  TIMED UP AND GO:  Was the test performed?  No    Cognitive Function:        05/18/2023    9:54 AM 05/13/2022   12:06 PM 04/24/2021    1:45 PM  6CIT Screen  What Year? 0 points 0 points 0 points  What month? 0 points 0 points 0 points  What time? 0 points 0 points 0 points  Count back from 20 0 points 0 points 0 points  Months in reverse 0 points 0 points 0 points  Repeat phrase 0 points 0 points 2 points  Total Score 0 points 0 points 2 points    Immunizations Immunization History  Administered Date(s) Administered   Moderna Sars-Covid-2 Vaccination 10/31/2019, 12/05/2019, 07/10/2020, 12/04/2020   Zoster Recombinant(Shingrix) 10/01/2020, 12/28/2020    TDAP status: Due, Education has been provided regarding the importance of this vaccine. Advised may receive this vaccine at local pharmacy or Health Dept. Aware to provide a copy of the vaccination record if obtained from local pharmacy or Health Dept. Verbalized acceptance and understanding.  Flu Vaccine status: Declined, Education has been provided regarding the importance of this vaccine but patient still declined. Advised may receive this vaccine at local pharmacy or Health Dept. Aware to provide a copy of the vaccination record if obtained from local pharmacy or Health Dept. Verbalized acceptance and understanding.  Pneumococcal vaccine status: Up to date  Covid-19 vaccine status: Information provided on how to obtain vaccines.   Qualifies for Shingles Vaccine? Yes   Zostavax completed No   Shingrix Completed?: Yes  Screening Tests Health  Maintenance  Topic Date Due   HIV Screening  Never done   Colonoscopy  08/23/2022   COVID-19 Vaccine (5 - 2023-24 season) 02/15/2023   INFLUENZA VACCINE  09/14/2023 (Originally 01/15/2023)   DTaP/Tdap/Td (1 - Tdap) 04/16/2024 (Originally 07/06/1980)   Medicare Annual Wellness (AWV)  05/17/2024   Hepatitis C Screening  Completed   Zoster Vaccines- Shingrix  Completed   HPV VACCINES  Aged Out    Health Maintenance  Health Maintenance Due  Topic Date Due   HIV Screening  Never done   Colonoscopy  08/23/2022   COVID-19 Vaccine (5 - 2023-24 season) 02/15/2023    Colorectal cancer screening: Type of screening: Colonoscopy. Completed 08/23/19. Repeat every 3 years (Patient would like to postpone)   Lung Cancer Screening: (Low Dose CT Chest recommended if Age 79-80 years, 20 pack-year currently smoking OR have quit w/in 15years.) does not qualify.   Lung Cancer Screening Referral: n/a  Additional Screening:  Hepatitis C Screening: does qualify; Completed 03/11/16  Vision Screening: Recommended annual ophthalmology exams for early detection of glaucoma and other disorders of the eye. Is the patient up to date with their annual eye exam?  No  Who is the provider or what is the name of the office in which the patient attends annual eye exams? none If pt is not established with a provider, would they like to be referred to a provider to establish care? No .   Dental Screening: Recommended annual dental exams for proper oral hygiene  Community Resource Referral / Chronic Care Management: CRR required this visit?  No   CCM required this visit?  No     Plan:     I have personally reviewed and noted the following in the patient's chart:   Medical and social history Use of alcohol, tobacco or illicit drugs  Current medications and supplements including opioid prescriptions. Patient is currently taking opioid prescriptions. Information provided to patient regarding non-opioid  alternatives. Patient advised to discuss non-opioid treatment plan with their provider. Functional ability and status Nutritional status Physical activity Advanced directives List of other physicians Hospitalizations, surgeries, and ER visits in previous 12 months Vitals Screenings to include cognitive, depression, and falls Referrals and appointments  In addition, I have reviewed and discussed with patient certain preventive protocols, quality metrics, and best practice recommendations. A written personalized care plan for preventive services as well as general preventive health recommendations were provided to patient.     Kandis Fantasia [place], [place]   19/06/4780   After Visit Summary: (Declined) Due to this being a telephonic visit, with patients personalized plan was offered to patient but patient Declined AVS at this time   Nurse Notes: No concerns at this time

## 2023-05-18 NOTE — Patient Instructions (Signed)
Andre Hunt , Thank you for taking time to come for your Medicare Wellness Visit. I appreciate your ongoing commitment to your health goals. Please review the following plan we discussed and let me know if I can assist you in the future.   Referrals/Orders/Follow-Ups/Clinician Recommendations: Aim for 30 minutes of exercise or brisk walking, 6-8 glasses of water, and 5 servings of fruits and vegetables each day.  This is a list of the screening recommended for you and due dates:  Health Maintenance  Topic Date Due   HIV Screening  Never done   Colon Cancer Screening  08/23/2022   COVID-19 Vaccine (5 - 2023-24 season) 02/15/2023   Flu Shot  09/14/2023*   DTaP/Tdap/Td vaccine (1 - Tdap) 04/16/2024*   Medicare Annual Wellness Visit  05/17/2024   Hepatitis C Screening  Completed   Zoster (Shingles) Vaccine  Completed   HPV Vaccine  Aged Out  *Topic was postponed. The date shown is not the original due date.    Advanced directives: (ACP Link)Information on Advanced Care Planning can be found at [facility] [place] Advance Health Care Directives Advance Health Care Directives ([email or web address]   Next Medicare Annual Wellness Visit scheduled for next year: Yes

## 2023-06-03 ENCOUNTER — Telehealth: Payer: Self-pay | Admitting: Orthopaedic Surgery

## 2023-06-03 MED ORDER — HYDROCODONE-ACETAMINOPHEN 5-325 MG PO TABS: 1.0000 | ORAL_TABLET | Freq: Four times a day (QID) | ORAL | 0 refills | Status: DC | PRN

## 2023-06-03 NOTE — Telephone Encounter (Signed)
Dr. Sanjuan Dame pt - spoke w/the pt, he is requesting a refill for his Hydrocodone 5-325 to be sent to [facility]

## 2023-06-22 ENCOUNTER — Telehealth: Payer: Self-pay | Admitting: Orthopaedic Surgery

## 2023-06-24 ENCOUNTER — Encounter: Payer: Self-pay | Admitting: Orthopaedic Surgery

## 2023-06-24 ENCOUNTER — Ambulatory Visit: Payer: Medicare Other | Admitting: Orthopaedic Surgery

## 2023-06-24 VITALS — Ht 68.0 in | Wt 226.0 lb

## 2023-06-24 DIAGNOSIS — G8929 Other chronic pain: Secondary | ICD-10-CM

## 2023-06-24 DIAGNOSIS — M25561 Pain in right knee: Secondary | ICD-10-CM | POA: Diagnosis not present

## 2023-06-24 NOTE — Progress Notes (Signed)
 My knee hurts with the cold weather  He has more right knee pain with the colder weather.  He has no new trauma.  He has no giving way. He has swelling and popping.  ROM of the right knee is 0 to 110,  Stable, Slight crepitus, no effusion today.  Gait is good.  NV intact.  No distal edema.  Encounter Diagnosis  Name Primary?   Chronic pain of right knee Yes   Return in three months.  Call if any problem.  Precautions discussed.  Electronically Signed Lemond Stable, MD 1/8/202510:08 AM

## 2023-07-07 ENCOUNTER — Other Ambulatory Visit: Payer: Self-pay | Admitting: Nurse Practitioner

## 2023-07-07 ENCOUNTER — Telehealth: Payer: Self-pay | Admitting: Orthopaedic Surgery

## 2023-07-07 DIAGNOSIS — J301 Allergic rhinitis due to pollen: Secondary | ICD-10-CM

## 2023-07-07 MED ORDER — HYDROCORTISONE (PERIANAL) 2.5 % EX CREA: 1.0000 | TOPICAL_CREAM | Freq: Two times a day (BID) | CUTANEOUS | 0 refills | Status: AC | PRN

## 2023-07-07 MED ORDER — HYDROCODONE-ACETAMINOPHEN 5-325 MG PO TABS: 1.0000 | ORAL_TABLET | Freq: Four times a day (QID) | ORAL | 0 refills | Status: DC | PRN

## 2023-07-07 NOTE — Telephone Encounter (Signed)
Dr. Sanjuan Dame pt - spoke w/the pt, he is requesting a refill on Atorvastatin 40mg  and Hydrocodone 5-325 to be sent to [facility].

## 2023-07-08 NOTE — Addendum Note (Signed)
Addended byCaffie Damme on: 07/08/2023 08:25 AM   Modules accepted: Orders

## 2023-08-04 ENCOUNTER — Telehealth: Payer: Self-pay | Admitting: Family Medicine

## 2023-08-04 NOTE — Telephone Encounter (Unsigned)
 Copied from CRM [id number]. Topic: Clinical - Medical Advice >> Aug 04, 2023  4:30 PM Alcus Dad H wrote: Reason for CRM: Patient requested a call back from Mary-Margaret's nurse regarding a cough he has had for 2 days, did offer him an appointment but he did not want it

## 2023-08-04 NOTE — Telephone Encounter (Signed)
 Lmtcb

## 2023-08-05 NOTE — Telephone Encounter (Signed)
 Called and made appt

## 2023-08-06 ENCOUNTER — Telehealth: Payer: Medicare Other | Admitting: Nurse Practitioner

## 2023-08-06 ENCOUNTER — Encounter: Payer: Self-pay | Admitting: Nurse Practitioner

## 2023-08-06 DIAGNOSIS — J069 Acute upper respiratory infection, unspecified: Secondary | ICD-10-CM

## 2023-08-06 MED ORDER — BENZONATATE 100 MG PO CAPS
100.0000 mg | ORAL_CAPSULE | Freq: Three times a day (TID) | ORAL | 0 refills | Status: DC | PRN
Start: 2023-08-06 — End: 2023-10-19

## 2023-08-06 MED ORDER — AMOXICILLIN-POT CLAVULANATE 875-125 MG PO TABS: 1.0000 | ORAL_TABLET | Freq: Two times a day (BID) | ORAL | 0 refills | Status: DC

## 2023-08-06 NOTE — Patient Instructions (Signed)

## 2023-08-06 NOTE — Progress Notes (Signed)
 Virtual Visit Consent   Andre Hunt, you are scheduled for a virtual visit with Mary-Margaret Daphine Deutscher, FNP, a [facility] provider, today.     Just as with appointments in the office, your consent must be obtained to participate.  Your consent will be active for this visit and any virtual visit you may have with one of our providers in the next 365 days.     If you have a MyChart account, a copy of this consent can be sent to you electronically.  All virtual visits are billed to your insurance company just like a traditional visit in the office.    As this is a virtual visit, video technology does not allow for your provider to perform a traditional examination.  This may limit your provider's ability to fully assess your condition.  If your provider identifies any concerns that need to be evaluated in person or the need to arrange testing (such as labs, EKG, etc.), we will make arrangements to do so.     Although advances in technology are sophisticated, we cannot ensure that it will always work on either your end or our end.  If the connection with a video visit is poor, the visit may have to be switched to a telephone visit.  With either a video or telephone visit, we are not always able to ensure that we have a secure connection.     I need to obtain your verbal consent now.   Are you willing to proceed with your visit today? YES   Andre Hunt has provided verbal consent on 08/06/2023 for a virtual visit (video or telephone).   Mary-Margaret Daphine Deutscher, FNP   Date: 08/06/2023 9:26 AM   Virtual Visit via Video Note   I, Mary-Margaret Daphine Deutscher, connected with Andre Hunt ([id number], 08/31/1960) on 08/06/23 at 10:15 AM EST by a video-enabled telemedicine application and verified that I am speaking with the correct person using two identifiers.  Location: Patient: Virtual Visit Location Patient: Home Provider: Virtual Visit Location Provider: Mobile   I discussed the limitations  of evaluation and management by telemedicine and the availability of in person appointments. The patient expressed understanding and agreed to proceed.    History of Present Illness: Andre Hunt is a 63 y.o. who identifies as a male who was assigned male at birth, and is being seen today for cough.  HPI: Cough This is a new problem. The current episode started in the past 7 days. The problem has been waxing and waning. The problem occurs every few minutes. The cough is Productive of sputum. Associated symptoms include ear congestion and rhinorrhea. Pertinent negatives include no sore throat. Nothing aggravates the symptoms. Treatments tried: nyquil.    Review of Systems  HENT:  Positive for rhinorrhea. Negative for sore throat.   Respiratory:  Positive for cough.     Problems:  Patient Active Problem List   Diagnosis Date Noted   GAD (generalized anxiety disorder) 04/18/2019   BMI 34.0-34.9,adult 03/11/2016   Primary gout 12/13/2014   Essential hypertension 12/13/2014   Internal hemorrhoids with bleeding and seepage 08/10/2014   Hyperlipidemia with target LDL less than 100 03/24/2014   History of colonic polyps 09/28/2012    Allergies:  Allergies  Allergen Reactions   Iohexol Anaphylaxis    Allergy to IVP dye   Medications:  Current Outpatient Medications:    naproxen (NAPROSYN) 500 MG tablet, TAKE 1 TABLET ONCE DAILY WITH MEAL, Disp: 60 tablet, Rfl: 5  atorvastatin (LIPITOR) 40 MG tablet, TAKE ONE TABLET ONCE DAILY AT 6PM, Disp: 90 tablet, Rfl: 1   cyclobenzaprine (FLEXERIL) 10 MG tablet, Take 1 tablet (10 mg total) by mouth at bedtime. One tablet every night at bedtime as needed for spasm., Disp: 30 tablet, Rfl: 0   diazepam (VALIUM) 5 MG tablet, Take 1 tablet (5 mg total) by mouth every 12 (twelve) hours as needed for anxiety., Disp: 60 tablet, Rfl: 5   fluticasone (FLONASE) 50 MCG/ACT nasal spray, Place 2 sprays into both nostrils daily., Disp: 16 g, Rfl: 6    HYDROcodone-acetaminophen (NORCO/VICODIN) 5-325 MG tablet, Take 1 tablet by mouth every 6 (six) hours as needed for moderate pain (pain score 4-6)., Disp: 110 tablet, Rfl: 0   hydrocortisone (ANUSOL-HC) 2.5 % rectal cream, Place 1 Application rectally 2 (two) times daily as needed for hemorrhoids or anal itching., Disp: 30 g, Rfl: 0   levocetirizine (XYZAL) 5 MG tablet, TAKE ONE TABLET EVERY EVENING, Disp: 90 tablet, Rfl: 1   lisinopril (ZESTRIL) 10 MG tablet, Take 1 tablet (10 mg total) by mouth daily., Disp: 90 tablet, Rfl: 1   promethazine (PHENERGAN) 12.5 MG tablet, Take 1 tablet (12.5 mg total) by mouth every 8 (eight) hours as needed for nausea or vomiting., Disp: 20 tablet, Rfl: 0  Observations/Objective: Patient is well-developed, well-nourished in no acute distress.  Resting comfortably  at home.  Head is normocephalic, atraumatic.  No labored breathing.  Speech is clear and coherent with logical content.  Patient is alert and oriented at baseline.  Raspy voice Deep cough  Assessment and Plan:  Andre Hunt in today with chief complaint of No chief complaint on file.   1. URI with cough and congestion (Primary) 1. Take meds as prescribed 2. Use a cool mist humidifier especially during the winter months and when heat has been humid. 3. Use saline nose sprays frequently 4. Saline irrigations of the nose can be very helpful if done frequently.  * 4X daily for 1 week*  * Use of a nettie pot can be helpful with this. Follow directions with this* 5. Drink plenty of fluids 6. Keep thermostat turn down low 7.For any cough or congestion- tesalon perles 8. For fever or aces or pains- take tylenol or ibuprofen appropriate for age and weight.  * for fevers greater than 101 orally you may alternate ibuprofen and tylenol every  3 hours.    Meds ordered this encounter  Medications   amoxicillin-clavulanate (AUGMENTIN) 875-125 MG tablet    Sig: Take 1 tablet by mouth 2 (two) times  daily.    Dispense:  14 tablet    Refill:  0    Supervising Provider:   Arville Care A [[id number]]   benzonatate (TESSALON PERLES) 100 MG capsule    Sig: Take 1 capsule (100 mg total) by mouth 3 (three) times daily as needed.    Dispense:  20 capsule    Refill:  0    Supervising Provider:   Arville Care A [[id number]]         Follow Up Instructions: I discussed the assessment and treatment plan with the patient. The patient was provided an opportunity to ask questions and all were answered. The patient agreed with the plan and demonstrated an understanding of the instructions.  A copy of instructions were sent to the patient via MyChart.  The patient was advised to call back or seek an in-person evaluation if the symptoms worsen or if the condition fails to  improve as anticipated.  Time:  I spent 12 minutes with the patient via telehealth technology discussing the above problems/concerns.    Mary-Margaret Daphine Deutscher, FNP

## 2023-08-10 ENCOUNTER — Telehealth: Payer: Self-pay | Admitting: Orthopaedic Surgery

## 2023-08-10 MED ORDER — HYDROCODONE-ACETAMINOPHEN 5-325 MG PO TABS: 1.0000 | ORAL_TABLET | Freq: Four times a day (QID) | ORAL | 0 refills | Status: DC | PRN

## 2023-08-10 NOTE — Telephone Encounter (Signed)
 Dr. Sanjuan Dame pt - spoke w/the pt, pt requested a refill on Hydrocodone 5-325 to be sent to [facility].

## 2023-08-13 ENCOUNTER — Ambulatory Visit: Payer: Self-pay | Admitting: Nurse Practitioner

## 2023-08-13 NOTE — Telephone Encounter (Signed)
 Copied from CRM [id number]. Topic: Clinical - Red Word Triage >> Aug 13, 2023  1:09 PM Elle L wrote: Red Word that prompted transfer to Nurse Triage: The patient was seen on 2/20 for a cough. He was prescribed medication. However, he states that his sinuses have been bad off and on since then and it is causing difficulty breathing.  Chief Complaint: pain to inner nostril sinus and runny nose Symptoms: sx occur more often since 9 years . Is taking anti allergy and Flonase but sx persist.  Frequency: comes and goes- one day sx free, next days needing med Pertinent Negatives: Patient denies SOB fever Disposition: [] ED /[] Urgent Care (no appt availability in office) / [] Appointment(In office/virtual)/ []  [place] [facility]/ [] Home Care/ [] Refused Recommended Disposition /[] [place] [facility]/ [x]  Follow-up with PCP Additional Notes: this has been a problem since he was stung by a bee at age 59. Please call pt back if appt needed. Called [facility] but unable to hold for nurse.   Reason for Disposition  [1] Using nasal washes and pain medicine > 24 hours AND [2] sinus pain (around cheekbone or eye) persists  Answer Assessment - Initial Assessment Questions 1. LOCATION: "Where does it hurt?"      Left side of nose 2. ONSET: "When did the sinus pain start?"  (e.g., hours, days)      On and off this week  3. SEVERITY: "How bad is the pain?"   (Scale 1-10; mild, moderate or severe)   - MILD (1-3): doesn't interfere with normal activities    - MODERATE (4-7): interferes with normal activities (e.g., work or school) or awakens from sleep   - SEVERE (8-10): excruciating pain and patient unable to do any normal activities        mild 4. RECURRENT SYMPTOM: "Have you ever had sinus problems before?" If Yes, ask: "When was the last time?" and "What happened that time?"      yes 5. NASAL CONGESTION: "Is the nose blocked?" If Yes, ask: "Can you open it or must you breathe through your mouth?"     no 6.  NASAL DISCHARGE: "Do you have discharge from your nose?" If so ask, "What color?"     Yes- clear  7. FEVER: "Do you have a fever?" If Yes, ask: "What is it, how was it measured, and when did it start?"      no 8. OTHER SYMPTOMS: "Do you have any other symptoms?" (e.g., sore throat, cough, earache, difficulty breathing)     Left side hot poker up nose, eye swelling, sneezing, runny nose 9. PREGNANCY: "Is there any chance you are pregnant?" "When was your last menstrual period?"     N/a  Protocols used: Sinus Pain or Congestion-A-AH

## 2023-08-14 ENCOUNTER — Other Ambulatory Visit: Payer: Self-pay | Admitting: Nurse Practitioner

## 2023-08-14 NOTE — Telephone Encounter (Signed)
 I agree the patient needs an appointment if he is not feeling well.

## 2023-08-14 NOTE — Telephone Encounter (Signed)
 Please review and advise.

## 2023-09-09 ENCOUNTER — Ambulatory Visit: Admitting: Orthopaedic Surgery

## 2023-09-09 ENCOUNTER — Encounter: Payer: Self-pay | Admitting: Orthopaedic Surgery

## 2023-09-09 VITALS — BP 148/93 | HR 80 | Ht 68.0 in | Wt 226.0 lb

## 2023-09-09 DIAGNOSIS — M25561 Pain in right knee: Secondary | ICD-10-CM

## 2023-09-09 DIAGNOSIS — G8929 Other chronic pain: Secondary | ICD-10-CM

## 2023-09-09 MED ORDER — HYDROCODONE-ACETAMINOPHEN 5-325 MG PO TABS: 1.0000 | ORAL_TABLET | Freq: Four times a day (QID) | ORAL | 0 refills | Status: DC | PRN

## 2023-09-09 NOTE — Progress Notes (Signed)
 My knee hurts some  He has good and bad days with the right knee pain. It depends on activity and weather.  He has no new trauma.  He has no giving way.  Right knee is tender but has ROM 0 to 110.  Stable.  Slight effusion and crepitus  Gait is good.  NV intact.  No distal edema.  Encounter Diagnosis  Name Primary?   Chronic pain of right knee Yes   Return in three months.  I have reviewed the [place] Controlled Substance Reporting System web site prior to prescribing narcotic medicine for this patient.  Call if any problem.  Precautions discussed.  Electronically Signed Darreld Mclean, MD 3/26/202510:04 AM

## 2023-09-23 ENCOUNTER — Ambulatory Visit: Payer: Medicare Other | Admitting: Orthopaedic Surgery

## 2023-10-06 ENCOUNTER — Telehealth: Payer: Self-pay | Admitting: Orthopaedic Surgery

## 2023-10-06 MED ORDER — HYDROCODONE-ACETAMINOPHEN 5-325 MG PO TABS: 1.0000 | ORAL_TABLET | Freq: Four times a day (QID) | ORAL | 0 refills | Status: DC | PRN

## 2023-10-06 NOTE — Addendum Note (Signed)
 Addended by: Milinda Allen on: 10/06/2023 07:07 PM   Modules accepted: Orders

## 2023-10-06 NOTE — Telephone Encounter (Signed)
 Dr. Vicente Graham pt - spoke w/the pt, he is requesting a refill for Hydrocodone  5-325 to be sent to [facility]

## 2023-10-19 ENCOUNTER — Encounter: Payer: Self-pay | Admitting: Nurse Practitioner

## 2023-10-19 ENCOUNTER — Ambulatory Visit (INDEPENDENT_AMBULATORY_CARE_PROVIDER_SITE_OTHER)

## 2023-10-19 ENCOUNTER — Ambulatory Visit (INDEPENDENT_AMBULATORY_CARE_PROVIDER_SITE_OTHER): Payer: Medicare Other | Admitting: Nurse Practitioner

## 2023-10-19 VITALS — BP 130/83 | HR 70 | Temp 97.6°F | Ht 68.0 in | Wt 226.0 lb

## 2023-10-19 DIAGNOSIS — M1A00X0 Idiopathic chronic gout, unspecified site, without tophus (tophi): Secondary | ICD-10-CM | POA: Diagnosis not present

## 2023-10-19 DIAGNOSIS — I10 Essential (primary) hypertension: Secondary | ICD-10-CM

## 2023-10-19 DIAGNOSIS — E785 Hyperlipidemia, unspecified: Secondary | ICD-10-CM

## 2023-10-19 DIAGNOSIS — R0989 Other specified symptoms and signs involving the circulatory and respiratory systems: Secondary | ICD-10-CM | POA: Diagnosis not present

## 2023-10-19 DIAGNOSIS — Z87891 Personal history of nicotine dependence: Secondary | ICD-10-CM | POA: Diagnosis not present

## 2023-10-19 DIAGNOSIS — Z6834 Body mass index (BMI) 34.0-34.9, adult: Secondary | ICD-10-CM

## 2023-10-19 DIAGNOSIS — F411 Generalized anxiety disorder: Secondary | ICD-10-CM

## 2023-10-19 DIAGNOSIS — Z0001 Encounter for general adult medical examination with abnormal findings: Secondary | ICD-10-CM

## 2023-10-19 DIAGNOSIS — Z0000 Encounter for general adult medical examination without abnormal findings: Secondary | ICD-10-CM

## 2023-10-19 NOTE — Progress Notes (Signed)
 Subjective:    Patient ID: Andre Hunt, male    DOB: November 26, 1960, 63 y.o.   MRN: [id number]   Chief Complaint: annual physical   HPI:  Andre Hunt is a 63 y.o. who identifies as a male who was assigned male at birth.   Social history: Lives with: wife Work history: disability   Comes in today for follow up of the following chronic medical issues:  1. Essential hypertension No c/o chest pain, sob or headache. Does not check blood pressure at home. BP Readings from Last 3 Encounters:  09/09/23 (!) 148/93  04/17/23 127/78  03/25/23 133/79     2. Hyperlipidemia with target LDL less than 100 Does not watch diet and does no dedicated exercise. Lab Results  Component Value Date   CHOL 111 10/16/2022   HDL 40 10/16/2022   LDLCALC 50 10/16/2022   TRIG 113 10/16/2022   CHOLHDL 2.8 10/16/2022   The ASCVD Risk score (Arnett DK, et al., 2019) failed to calculate for the following reasons:   The valid total cholesterol range is 130 to 320 mg/dL   3. GAD (generalized anxiety disorder) Is on valium  BID. Stays anxious.    04/17/2023    9:43 AM 01/27/2023   10:17 AM 10/16/2022    9:20 AM 04/17/2022    8:40 AM  GAD 7 : Generalized Anxiety Score  Nervous, Anxious, on Edge 0 0 0 0  Control/stop worrying 0 0 0 0  Worry too much - different things 0 0 0 0  Trouble relaxing 0 0 0 0  Restless 0 0 0 0  Easily annoyed or irritable 0 0 0 0  Afraid - awful might happen 0 0 0 0  Total GAD 7 Score 0 0 0 0  Anxiety Difficulty Not difficult at all Not difficult at all Not difficult at all Not difficult at all       05/18/2023    9:49 AM 04/17/2023    9:42 AM 01/27/2023   10:16 AM  Depression screen PHQ 2/9  Decreased Interest 0 0 0  Down, Depressed, Hopeless 0 0 0  PHQ - 2 Score 0 0 0  Altered sleeping  0 0  Tired, decreased energy  0 0  Change in appetite  0 0  Feeling bad or failure about yourself   0 0  Trouble concentrating  0 0  Moving slowly or fidgety/restless  0 0   Suicidal thoughts  0 0  PHQ-9 Score  0 0  Difficult doing work/chores  Not difficult at all Not difficult at all     4. Idiopathic chronic gout without tophus, unspecified site No recent flare ups  5. BMI 34.0-34.9,adult Weight is unchanged Wt Readings from Last 3 Encounters:  10/19/23 226 lb (102.5 kg)  09/09/23 226 lb (102.5 kg)  06/24/23 226 lb (102.5 kg)   BMI Readings from Last 3 Encounters:  10/19/23 34.36 kg/m  09/09/23 34.36 kg/m  06/24/23 34.36 kg/m       New complaints: None today  Allergies  Allergen Reactions   Iohexol Anaphylaxis    Allergy to IVP dye   Outpatient Encounter Medications as of 10/19/2023  Medication Sig   naproxen  (NAPROSYN ) 500 MG tablet TAKE 1 TABLET ONCE DAILY WITH MEAL   amoxicillin -clavulanate (AUGMENTIN ) 875-125 MG tablet Take 1 tablet by mouth 2 (two) times daily.   atorvastatin  (LIPITOR) 40 MG tablet TAKE ONE TABLET ONCE DAILY AT 6PM   benzonatate  (TESSALON  PERLES) 100 MG capsule Take  1 capsule (100 mg total) by mouth 3 (three) times daily as needed.   cyclobenzaprine  (FLEXERIL ) 10 MG tablet Take 1 tablet (10 mg total) by mouth at bedtime. One tablet every night at bedtime as needed for spasm.   diazepam  (VALIUM ) 5 MG tablet Take 1 tablet (5 mg total) by mouth every 12 (twelve) hours as needed for anxiety.   fluticasone  (FLONASE ) 50 MCG/ACT nasal spray Place 2 sprays into both nostrils daily.   HYDROcodone -acetaminophen  (NORCO/VICODIN) 5-325 MG tablet Take 1 tablet by mouth every 6 (six) hours as needed for moderate pain (pain score 4-6).   hydrocortisone  (ANUSOL -HC) 2.5 % rectal cream Place 1 Application rectally 2 (two) times daily as needed for hemorrhoids or anal itching.   levocetirizine (XYZAL ) 5 MG tablet TAKE ONE TABLET EVERY EVENING   lisinopril  (ZESTRIL ) 10 MG tablet Take 1 tablet (10 mg total) by mouth daily.   promethazine  (PHENERGAN ) 12.5 MG tablet Take 1 tablet (12.5 mg total) by mouth every 8 (eight) hours as needed  for nausea or vomiting.   No facility-administered encounter medications on file as of 10/19/2023.    Past Surgical History:  Procedure Laterality Date   APPENDECTOMY  1988   CARPAL TUNNEL RELEASE     bilateral 2000 and 2001   COLONOSCOPY  09/23/2012   ELBOW SURGERY  2006   muscle repair   EXPLORATORY LAPAROTOMY  1996   after falling on scissors; repair of "hole in intestines"   INCISIONAL HERNIA REPAIR  1997   POLYPECTOMY     UMBILICAL HERNIA REPAIR  1968    Family History  Problem Relation Age of Onset   Kidney cancer Mother    Diabetes Mother    COPD Mother    Anuerysm Father    Diabetes Father    Heart disease Father    Diabetes Sister    Colon cancer Neg Hx    Colon polyps Neg Hx    Esophageal cancer Neg Hx    Rectal cancer Neg Hx    Stomach cancer Neg Hx       Controlled substance contract: sees Dr. Iline Mallory for pain     Review of Systems  Constitutional:  Negative for diaphoresis.  Eyes:  Negative for pain.  Respiratory:  Negative for shortness of breath.   Cardiovascular:  Negative for chest pain, palpitations and leg swelling.  Gastrointestinal:  Negative for abdominal pain.  Endocrine: Negative for polydipsia.  Skin:  Negative for rash.  Neurological:  Negative for dizziness, weakness and headaches.  Hematological:  Does not bruise/bleed easily.  All other systems reviewed and are negative.      Objective:   Physical Exam Vitals and nursing note reviewed.  Constitutional:      Appearance: Normal appearance. He is well-developed.  HENT:     Head: Normocephalic.     Nose: Nose normal.     Mouth/Throat:     Mouth: Mucous membranes are moist.     Pharynx: Oropharynx is clear.  Eyes:     Pupils: Pupils are equal, round, and reactive to light.  Neck:     Thyroid: No thyroid mass or thyromegaly.     Vascular: No carotid bruit or JVD.     Trachea: Phonation normal.  Cardiovascular:     Rate and Rhythm: Normal rate and regular rhythm.   Pulmonary:     Effort: Pulmonary effort is normal. No respiratory distress.     Breath sounds: Normal breath sounds.  Abdominal:     General:  Bowel sounds are normal.     Palpations: Abdomen is soft.     Tenderness: There is no abdominal tenderness.  Musculoskeletal:        General: Normal range of motion.     Cervical back: Normal range of motion and neck supple.  Lymphadenopathy:     Cervical: No cervical adenopathy.  Skin:    General: Skin is warm and dry.  Neurological:     Mental Status: He is alert and oriented to person, place, and time.  Psychiatric:        Behavior: Behavior normal.        Thought Content: Thought content normal.        Judgment: Judgment normal.     BP 130/83   Pulse 70   Temp 97.6 F (36.4 C) (Temporal)   Ht 5\' 8"  (1.727 m)   Wt 226 lb (102.5 kg)   SpO2 97%   BMI 34.36 kg/m   EKG- NSR-Mary-Margaret Gaylyn Keas, FNP  Chest xray clear-Preliminary reading by Irvine Mantis, FNP  [facility]       Assessment & Plan:  Andre Hunt comes in today with chief complaint of annual physical  Diagnosis and orders addressed:  1. Essential hypertension Low sodium diet - lisinopril  (ZESTRIL ) 10 MG tablet; Take 1 tablet (10 mg total) by mouth daily.  Dispense: 90 tablet; Refill: 1 - CBC with Differential/Platelet - CMP14+EGFR  2. Hyperlipidemia with target LDL less than 100 Low fat diet - atorvastatin  (LIPITOR) 40 MG tablet; TAKE ONE TABLET ONCE DAILY AT 6PM  Dispense: 90 tablet; Refill: 1 - Lipid panel  3. GAD (generalized anxiety disorder) Stress management - diazepam  (VALIUM ) 5 MG tablet; Take 1 tablet (5 mg total) by mouth every 12 (twelve) hours as needed for anxiety.  Dispense: 60 tablet; Refill: 5  4. Idiopathic chronic gout without tophus, unspecified site   5. BMI 34.0-34.9,adult Discussed diet and exercise for person with BMI >25 Will recheck weight in 3-6 months     Labs pending Health Maintenance reviewed Diet and exercise  encouraged  Follow up plan: 6 months   Mary-Margaret Gaylyn Keas, FNP

## 2023-10-21 ENCOUNTER — Other Ambulatory Visit: Payer: Self-pay | Admitting: Nurse Practitioner

## 2023-10-21 DIAGNOSIS — I10 Essential (primary) hypertension: Secondary | ICD-10-CM

## 2023-10-21 DIAGNOSIS — R112 Nausea with vomiting, unspecified: Secondary | ICD-10-CM

## 2023-10-21 DIAGNOSIS — F411 Generalized anxiety disorder: Secondary | ICD-10-CM

## 2023-10-22 LAB — TOXASSURE SELECT 13 (MW), URINE

## 2023-11-20 ENCOUNTER — Telehealth: Payer: Self-pay | Admitting: Radiology

## 2023-11-20 NOTE — Telephone Encounter (Signed)
 Refill meds requested by VM.

## 2023-11-23 MED ORDER — HYDROCODONE-ACETAMINOPHEN 5-325 MG PO TABS: 1.0000 | ORAL_TABLET | Freq: Four times a day (QID) | ORAL | 0 refills | Status: DC | PRN

## 2023-12-09 ENCOUNTER — Ambulatory Visit: Admitting: Orthopaedic Surgery

## 2023-12-16 ENCOUNTER — Ambulatory Visit: Admitting: Orthopaedic Surgery

## 2023-12-16 ENCOUNTER — Encounter: Payer: Self-pay | Admitting: Orthopaedic Surgery

## 2023-12-16 VITALS — BP 125/77 | HR 80 | Ht 68.0 in | Wt 223.0 lb

## 2023-12-16 DIAGNOSIS — G8929 Other chronic pain: Secondary | ICD-10-CM | POA: Diagnosis not present

## 2023-12-16 DIAGNOSIS — M25561 Pain in right knee: Secondary | ICD-10-CM | POA: Diagnosis not present

## 2023-12-16 MED ORDER — HYDROCODONE-ACETAMINOPHEN 5-325 MG PO TABS: 1.0000 | ORAL_TABLET | Freq: Four times a day (QID) | ORAL | 0 refills | Status: DC | PRN

## 2023-12-16 NOTE — Progress Notes (Signed)
 My knee pain is a little better.  He has had less pain in the right knee with the warmer weather.  He still has some swelling and popping but no new trauma, no redness.  ROM of the right knee is 0 to 110, crepitus and slight effusion is present, NV intact, no distal edema, no redness.  Encounter Diagnosis  Name Primary?   Chronic pain of right knee Yes   I have reviewed the [place]  Controlled Substance Reporting System web site prior to prescribing narcotic medicine for this patient.  Return in three months.  Call if any problem.  Precautions discussed.  Electronically Signed Lemond Stable, MD 7/2/202510:30 AM

## 2024-01-11 ENCOUNTER — Other Ambulatory Visit: Payer: Self-pay | Admitting: Nurse Practitioner

## 2024-01-11 DIAGNOSIS — J301 Allergic rhinitis due to pollen: Secondary | ICD-10-CM

## 2024-01-11 DIAGNOSIS — E785 Hyperlipidemia, unspecified: Secondary | ICD-10-CM

## 2024-01-18 ENCOUNTER — Telehealth: Payer: Self-pay | Admitting: Orthopaedic Surgery

## 2024-01-18 MED ORDER — HYDROCODONE-ACETAMINOPHEN 5-325 MG PO TABS: 1.0000 | ORAL_TABLET | Freq: Four times a day (QID) | ORAL | 0 refills | Status: DC | PRN

## 2024-01-18 NOTE — Telephone Encounter (Signed)
 Dr. Janae pt - pt lvm requesting a refill for Hydrocodone  5-325 to be sent to [facility].

## 2024-02-05 ENCOUNTER — Encounter: Payer: Self-pay | Admitting: Radiology

## 2024-02-18 ENCOUNTER — Telehealth: Payer: Self-pay | Admitting: Orthopaedic Surgery

## 2024-02-18 MED ORDER — HYDROCODONE-ACETAMINOPHEN 5-325 MG PO TABS: 1.0000 | ORAL_TABLET | Freq: Four times a day (QID) | ORAL | 0 refills | Status: DC | PRN

## 2024-02-18 NOTE — Telephone Encounter (Signed)
 Dr. Vicente Graham pt - spoke w/the pt, he is requesting a refill for Hydrocodone  5-325 to be sent to [facility]

## 2024-03-16 ENCOUNTER — Encounter: Payer: Self-pay | Admitting: Orthopaedic Surgery

## 2024-03-16 ENCOUNTER — Ambulatory Visit: Admitting: Orthopaedic Surgery

## 2024-03-16 DIAGNOSIS — M25561 Pain in right knee: Secondary | ICD-10-CM | POA: Diagnosis not present

## 2024-03-16 DIAGNOSIS — M25511 Pain in right shoulder: Secondary | ICD-10-CM | POA: Diagnosis not present

## 2024-03-16 DIAGNOSIS — G8929 Other chronic pain: Secondary | ICD-10-CM

## 2024-03-16 MED ORDER — HYDROCODONE-ACETAMINOPHEN 5-325 MG PO TABS: 1.0000 | ORAL_TABLET | Freq: Four times a day (QID) | ORAL | 0 refills | Status: AC | PRN

## 2024-03-16 NOTE — Progress Notes (Signed)
 My shoulder is more sore today.  He has chronic pain to the right knee.  It is doing well now.  He says the colder months make it worse.  He has no new trauma, no giving way.  He has had pain in the right shoulder, more at night.  He can sleep on it but when he turns in bed to the left, the shoulder pain wakes him up.  He can move it well, no numbness, no swelling, no redness.  Grips are good.  Right knee has slight effusion, crepitus, good ROM, stable, NV intact, no distal edema.  Right shoulder has full ROM no pain, no redness, no effusion.  Encounter Diagnoses  Name Primary?   Chronic pain of right knee Yes   Chronic right shoulder pain    I have reviewed the [place]  Controlled Substance Reporting System web site prior to prescribing narcotic medicine for this patient.  I have informed the patient I will be retiring from medical practice and from this office on March 17, 2024.  The patient has been offered continuing care with Dr. Margrette or Dr. Onesimo of this office.  The patient may choose another provider and the records will be forwarded after proper signature and notification.  Patient understands and agrees.  Return in three months.  Call if any problem.  Precautions discussed.  Electronically Signed Lemond Stable, MD 10/1/20259:07 AM

## 2024-03-16 NOTE — Patient Instructions (Signed)
 Follow up in 3 months with one of our other providers  Use aspercream, biofreeze over the counter and rub before bed to help with shoulder.

## 2024-04-18 ENCOUNTER — Encounter: Payer: Self-pay | Admitting: Radiology

## 2024-04-19 ENCOUNTER — Ambulatory Visit: Payer: Self-pay | Admitting: Nurse Practitioner

## 2024-04-19 ENCOUNTER — Encounter: Payer: Self-pay | Admitting: Nurse Practitioner

## 2024-04-19 VITALS — BP 116/71 | HR 61 | Temp 98.1°F | Ht 68.0 in | Wt 220.0 lb

## 2024-04-19 DIAGNOSIS — Z6834 Body mass index (BMI) 34.0-34.9, adult: Secondary | ICD-10-CM

## 2024-04-19 DIAGNOSIS — E785 Hyperlipidemia, unspecified: Secondary | ICD-10-CM

## 2024-04-19 DIAGNOSIS — F411 Generalized anxiety disorder: Secondary | ICD-10-CM

## 2024-04-19 DIAGNOSIS — M1A00X0 Idiopathic chronic gout, unspecified site, without tophus (tophi): Secondary | ICD-10-CM | POA: Diagnosis not present

## 2024-04-19 DIAGNOSIS — I10 Essential (primary) hypertension: Secondary | ICD-10-CM

## 2024-04-19 MED ORDER — DIAZEPAM 5 MG PO TABS: 5.0000 mg | ORAL_TABLET | Freq: Two times a day (BID) | ORAL | 5 refills | Status: AC | PRN

## 2024-04-19 MED ORDER — LISINOPRIL 10 MG PO TABS
10.0000 mg | ORAL_TABLET | Freq: Every day | ORAL | 1 refills | Status: AC
Start: 2024-04-19 — End: ?

## 2024-04-19 MED ORDER — ATORVASTATIN CALCIUM 40 MG PO TABS: ORAL_TABLET | ORAL | 1 refills | Status: AC

## 2024-04-19 NOTE — Progress Notes (Signed)
 Subjective:    Patient ID: Andre Hunt, male    DOB: [birth year], 63 y.o.   MRN: [id number]   Chief Complaint: medical management of chronic issues     HPI:  Andre Hunt is a 63 y.o. who identifies as a male who was assigned male at birth.   Social history: Lives with: wife Work history: disability   Comes in today for follow up of the following chronic medical issues:  1. Essential hypertension No c/o chest pain, sob or headache. Does not check blood pressure at home. BP Readings from Last 3 Encounters:  12/16/23 125/77  10/19/23 130/83  09/09/23 (!) 148/93     2. Hyperlipidemia with target LDL less than 100 Does not watch diet and does no dedicated exercise. Lab Results  Component Value Date   CHOL 111 10/16/2022   HDL 40 10/16/2022   LDLCALC 50 10/16/2022   TRIG 113 10/16/2022   CHOLHDL 2.8 10/16/2022     3. GAD (generalized anxiety disorder) Is on valium  BID. Stays anxious.    10/19/2023    9:26 AM 04/17/2023    9:43 AM 01/27/2023   10:17 AM 10/16/2022    9:20 AM  GAD 7 : Generalized Anxiety Score  Nervous, Anxious, on Edge 0 0 0 0  Control/stop worrying 0 0 0 0  Worry too much - different things 0 0 0 0  Trouble relaxing 0 0 0 0  Restless 0 0 0 0  Easily annoyed or irritable 0 0 0 0  Afraid - awful might happen 0 0 0 0  Total GAD 7 Score 0 0 0 0  Anxiety Difficulty Not difficult at all Not difficult at all Not difficult at all Not difficult at all       10/19/2023    9:26 AM 05/18/2023    9:49 AM 04/17/2023    9:42 AM  Depression screen PHQ 2/9  Decreased Interest 0 0 0  Down, Depressed, Hopeless 0 0 0  PHQ - 2 Score 0 0 0  Altered sleeping   0  Tired, decreased energy   0  Change in appetite   0  Feeling bad or failure about yourself    0  Trouble concentrating   0  Moving slowly or fidgety/restless   0  Suicidal thoughts   0  PHQ-9 Score   0  Difficult doing work/chores   Not difficult at all     4. Idiopathic chronic gout without  tophus, unspecified site No recent flare ups  5. BMI 34.0-34.9,adult Weight is down 3lbs Wt Readings from Last 3 Encounters:  04/19/24 220 lb (99.8 kg)  12/16/23 223 lb (101.2 kg)  10/19/23 226 lb (102.5 kg)   BMI Readings from Last 3 Encounters:  04/19/24 33.45 kg/m  12/16/23 33.91 kg/m  10/19/23 34.36 kg/m       New complaints: None today  Allergies  Allergen Reactions   Iohexol Anaphylaxis    Allergy to IVP dye   Outpatient Encounter Medications as of 04/19/2024  Medication Sig   atorvastatin  (LIPITOR) 40 MG tablet TAKE ONE TABLET DAILY AT 6PM   cyclobenzaprine  (FLEXERIL ) 10 MG tablet Take 1 tablet (10 mg total) by mouth at bedtime. One tablet every night at bedtime as needed for spasm.   diazepam  (VALIUM ) 5 MG tablet TAKE ONE TABLET EVERY TWELVE HOURS AS NEEDED FOR ANXIETY   fluticasone  (FLONASE ) 50 MCG/ACT nasal spray Place 2 sprays into both nostrils daily.   HYDROcodone -acetaminophen  (NORCO/VICODIN) 5-325  MG tablet Take 1 tablet by mouth every 6 (six) hours as needed for moderate pain (pain score 4-6).   hydrocortisone  (ANUSOL -HC) 2.5 % rectal cream Place 1 Application rectally 2 (two) times daily as needed for hemorrhoids or anal itching.   levocetirizine (XYZAL ) 5 MG tablet TAKE ONE TABLET EVERY EVENING   lisinopril  (ZESTRIL ) 10 MG tablet TAKE ONE TABLET DAILY   naproxen  (NAPROSYN ) 500 MG tablet TAKE 1 TABLET ONCE DAILY WITH MEAL   promethazine  (PHENERGAN ) 12.5 MG tablet TAKE ONE TABLET EVERY 8 HOURS AS NEEDED FOR NAUSEA AND VOMITING   No facility-administered encounter medications on file as of 04/19/2024.    Past Surgical History:  Procedure Laterality Date   APPENDECTOMY  1988   CARPAL TUNNEL RELEASE     bilateral 2000 and 2001   COLONOSCOPY  09/23/2012   ELBOW SURGERY  2006   muscle repair   EXPLORATORY LAPAROTOMY  1996   after falling on scissors; repair of hole in intestines   INCISIONAL HERNIA REPAIR  1997   POLYPECTOMY     UMBILICAL HERNIA  REPAIR  1968    Family History  Problem Relation Age of Onset   Kidney cancer Mother    Diabetes Mother    COPD Mother    Anuerysm Father    Diabetes Father    Heart disease Father    Diabetes Sister    Colon cancer Neg Hx    Colon polyps Neg Hx    Esophageal cancer Neg Hx    Rectal cancer Neg Hx    Stomach cancer Neg Hx       Controlled substance contract: sees Dr. Brenna for pain     Review of Systems  Constitutional:  Negative for diaphoresis.  Eyes:  Negative for pain.  Respiratory:  Negative for shortness of breath.   Cardiovascular:  Negative for chest pain, palpitations and leg swelling.  Gastrointestinal:  Negative for abdominal pain.  Endocrine: Negative for polydipsia.  Skin:  Negative for rash.  Neurological:  Negative for dizziness, weakness and headaches.  Hematological:  Does not bruise/bleed easily.  All other systems reviewed and are negative.      Objective:   Physical Exam Vitals and nursing note reviewed.  Constitutional:      Appearance: Normal appearance. He is well-developed.  HENT:     Head: Normocephalic.     Nose: Nose normal.     Mouth/Throat:     Mouth: Mucous membranes are moist.     Pharynx: Oropharynx is clear.  Eyes:     Pupils: Pupils are equal, round, and reactive to light.  Neck:     Thyroid: No thyroid mass or thyromegaly.     Vascular: No carotid bruit or JVD.     Trachea: Phonation normal.  Cardiovascular:     Rate and Rhythm: Normal rate and regular rhythm.  Pulmonary:     Effort: Pulmonary effort is normal. No respiratory distress.     Breath sounds: Normal breath sounds.  Abdominal:     General: Bowel sounds are normal.     Palpations: Abdomen is soft.     Tenderness: There is no abdominal tenderness.  Musculoskeletal:        General: Normal range of motion.     Cervical back: Normal range of motion and neck supple.  Lymphadenopathy:     Cervical: No cervical adenopathy.  Skin:    General: Skin is warm  and dry.  Neurological:     Mental Status: He is  alert and oriented to person, place, and time.  Psychiatric:        Behavior: Behavior normal.        Thought Content: Thought content normal.        Judgment: Judgment normal.     BP 116/71   Pulse 61   Temp 98.1 F (36.7 C) (Temporal)   Ht 5' 8 (1.727 m)   Wt 220 lb (99.8 kg)   SpO2 95%   BMI 33.45 kg/m         Assessment & Plan:  Andre Hunt comes in today with chief complaint of medical management of chronic issues    Diagnosis and orders addressed:  1. Essential hypertension Low sodium diet - lisinopril  (ZESTRIL ) 10 MG tablet; Take 1 tablet (10 mg total) by mouth daily.  Dispense: 90 tablet; Refill: 1 - CBC with Differential/Platelet - CMP14+EGFR  2. Hyperlipidemia with target LDL less than 100 Low fat diet - atorvastatin  (LIPITOR) 40 MG tablet; TAKE ONE TABLET ONCE DAILY AT 6PM  Dispense: 90 tablet; Refill: 1 - Lipid panel  3. GAD (generalized anxiety disorder) Stress management - diazepam  (VALIUM ) 5 MG tablet; Take 1 tablet (5 mg total) by mouth every 12 (twelve) hours as needed for anxiety.  Dispense: 60 tablet; Refill: 5  4. Idiopathic chronic gout without tophus, unspecified site   5. BMI 34.0-34.9,adult Discussed diet and exercise for person with BMI >25 Will recheck weight in 3-6 months   Labs pending Health Maintenance reviewed Diet and exercise encouraged  Follow up plan: 6 months   Mary-Margaret Gladis, FNP

## 2024-04-19 NOTE — Patient Instructions (Signed)
Exercise Information for Aging Adults Staying physically active is important as you age. Physical activity and exercise can help in maintaining quality of life, health, physical function, and reducing falls. The four types of exercises that are best for older adults are endurance, strength, balance, and flexibility. Contact your health care provider before you start any exercise routine. Ask your health care provider what activities are safe for you. What are the risks? Risks associated with exercising include: Overdoing it. This may lead to sore muscles or fatigue. Falls. Injuries. Dehydration. How to do these exercises Endurance exercises Endurance (aerobic) exercises raise your breathing rate and heart rate. Increasing your endurance helps you do everyday tasks and stay healthy. By improving the health of your body system that includes your heart, lungs, and blood vessels (circulatory system), you may also delay or prevent diseases such as heart disease, diabetes, and weak bones (osteoporosis). Types of endurance exercises include: Sports. Indoor activities, such as using gym equipment, doing water aerobics, or dancing. Outdoor activities, such as biking or jogging. Tasks around the house, such as gardening, yard work, and heavy household chores like cleaning. Walking, such as hiking or walking around your neighborhood. When doing endurance exercises, make sure you: Are aware of your surroundings. Use safety equipment as directed. Dress in layers when exercising outdoors. Drink plenty of water to stay well hydrated. Build up endurance slowly. Start with 10 minutes at a time, and gradually build up to doing 30 minutes at a time. Unless your health care provider gave you different instructions, aim to exercise for a total of 150 minutes a week. Spread out that time so you are working on endurance 3 or more days a week. Strength exercises Lifting, pulling, or pushing weights helps to  strengthen muscles. Having stronger muscles makes it easier to do everyday activities, such as getting up from a chair, climbing stairs, carrying groceries, and playing with grandchildren. Strength exercises include arm and leg exercises that may be done: With weights. Without weights (using your own body weight). With a resistance band. When doing strength exercises: Move smoothly and steadily. Do not suddenly thrust or jerk the weights, the resistance band, or your body. Start with no weights or with light weights, and gradually add more weight over time. Eventually, aim to use weights that are hard or very hard for you to lift. This means that you are able to do 8 repetitions with the weight, and the last few repetitions are very challenging. Lift or push weights into position for 3 seconds, hold the position for 1 second, and then take 3 seconds to return to your starting position. Breathe out (exhale) during difficult movements, like lifting or pushing weights. Breathe in (inhale) to relax your muscles before the next repetition. Consider alternating arms or legs, especially when you first start strength exercises. Expect some slight muscle soreness after each session. Do strength exercises on 2 or more days a week, for 30 minutes at a time. Avoid exercising the same muscle groups two days in a row. For example, if you work on your leg muscles one day, work on your arm muscles the next day. When you can do two sets of 10-15 repetitions with a certain weight, increase the amount of weight. Balance exercises Balance exercises can help to prevent falls. Balance exercises include: Standing on one foot. Heel-to-toe walk. Balance walk. Tai chi. Make sure you have something sturdy to hold onto while doing balance exercises, such as a sturdy chair. As your balance   improves, challenge yourself by holding on to the chair with one hand instead of two, and then with no hands. Trying exercises with your  eyes closed also challenges your balance, but be sure to have a sturdy surface (like a countertop) close by in case you need it. Do balance exercises as often as you want, or as often as directed by your health care provider. Flexibility exercises  Flexibility exercises improve how far you can bend, straighten, move, or rotate parts of your body (range of motion). These exercises also help you do everyday activities such as getting dressed or reaching for objects. Flexibility exercises include stretching different parts of the body, and they may be done in a standing or seated position or on the floor. When stretching, make sure you: Keep a slight bend in your arms and legs. Avoid completely straightening ("locking") your joints. Do not stretch so far that you feel pain. You should feel a mild stretching feeling. You may try stretching farther as you become more flexible over time. Relax and breathe between stretches. Hold on to something sturdy for balance as needed. Hold each stretch for 10-30 seconds. Repeat each stretch 3-5 times. General safety tips Exercise in well-lit areas. Do not hold your breath during exercises or stretches. Warm up before exercising, and cool down after exercising. This can help prevent injury. Drink plenty of water during exercise or any activity that makes you sweat. If you are not sure if an exercise is safe for you, or you are not sure how to do an exercise, talk with your health care provider. This is especially important if you have had surgery on muscles, bones, or joints (orthopedic surgery). Where to find more information You can find more information about exercise for older adults from: Your local health department, fitness center, or community center. These facilities may have programs for aging adults. National Institute on Aging: www.nia.nih.gov National Council on Aging: www.ncoa.org Summary Staying physically active is important as you age. Doing  endurance, strength, balance, and flexibility exercises can help in maintaining quality of life, health, physical function, and reducing falls. Make sure to contact your health care provider before you start any exercise routine. Ask your health care provider what activities are safe for you. This information is not intended to replace advice given to you by your health care provider. Make sure you discuss any questions you have with your health care provider. Document Revised: 10/15/2020 Document Reviewed: 10/15/2020 Elsevier Patient Education  2024 Else[employer] Inc[employer]  

## 2024-05-18 ENCOUNTER — Ambulatory Visit: Payer: Medicare Other

## 2024-05-18 VITALS — BP 116/71 | HR 61 | Ht 68.0 in | Wt 220.0 lb

## 2024-05-18 DIAGNOSIS — Z0000 Encounter for general adult medical examination without abnormal findings: Secondary | ICD-10-CM

## 2024-05-18 NOTE — Progress Notes (Signed)
 Chief Complaint  Patient presents with   Medicare Wellness     Subjective:   Andre Hunt is a 63 y.o. male who presents for a Medicare Annual Wellness Visit.  Visit info / Clinical Intake: Medicare Wellness Visit Type:: Subsequent Annual Wellness Visit Persons participating in visit and providing information:: patient Medicare Wellness Visit Mode:: Telephone If telephone:: video declined Since this visit was completed virtually, some vitals may be partially provided or unavailable. Missing vitals are due to the limitations of the virtual format.: Documented vitals are patient reported If Telephone or Video please confirm:: I connected with patient using audio/video enable telemedicine. I verified patient identity with two identifiers, discussed telehealth limitations, and patient agreed to proceed. Patient Location:: home Provider Location:: home office Interpreter Needed?: No Pre-visit prep was completed: yes AWV questionnaire completed by patient prior to visit?: no Living arrangements:: (!) lives alone Patient's Overall Health Status Rating: very good Typical amount of pain: none Does pain affect daily life?: no Are you currently prescribed opioids?: no  Dietary Habits and Nutritional Risks How many meals a day?: 2 Eats fruit and vegetables daily?: yes Most meals are obtained by: preparing own meals In the last 2 weeks, have you had any of the following?: none Diabetic:: no  Functional Status Activities of Daily Living (to include ambulation/medication): Independent Ambulation: Independent Medication Administration: Independent Home Management (perform basic housework or laundry): Independent Manage your own finances?: yes Primary transportation is: driving Concerns about hearing?: no  Fall Screening Falls in the past year?: 0 Number of falls in past year: 0 Was there an injury with Fall?: 0 Fall Risk Category Calculator: 0 Patient Fall Risk Level: Low Fall  Risk  Fall Risk Patient at Risk for Falls Due to: No Fall Risks Fall risk Follow up: Falls evaluation completed; Education provided  Home and Transportation Safety: All rugs have non-skid backing?: yes All stairs or steps have railings?: yes Grab bars in the bathtub or shower?: yes Have non-skid surface in bathtub or shower?: yes Good home lighting?: yes Regular seat belt use?: yes  Cognitive Assessment Difficulty concentrating, remembering, or making decisions? : no Will 6CIT or Mini Cog be Completed: yes What year is it?: 0 points What month is it?: 0 points Give patient an address phrase to remember (5 components): [address] [place] About what time is it?: 0 points Count backwards from 20 to 1: 0 points Say the months of the year in reverse: 0 points Repeat the address phrase from earlier: 0 points 6 CIT Score: 0 points  Advance Directives (For Healthcare) Does Patient Have a Medical Advance Directive?: No Would patient like information on creating a medical advance directive?: No - Patient declined  Reviewed/Updated  Reviewed/Updated: Reviewed All (Medical, Surgical, Family, Medications, Allergies, Care Teams, Patient Goals); Medical History; Surgical History; Family History; Medications; Allergies; Care Teams; Patient Goals    Allergies (verified) Iohexol   Current Medications (verified) Outpatient Encounter Medications as of 05/18/2024  Medication Sig   atorvastatin  (LIPITOR) 40 MG tablet TAKE ONE TABLET DAILY AT 6PM   cyclobenzaprine  (FLEXERIL ) 10 MG tablet Take 1 tablet (10 mg total) by mouth at bedtime. One tablet every night at bedtime as needed for spasm.   diazepam  (VALIUM ) 5 MG tablet Take 1 tablet (5 mg total) by mouth every 12 (twelve) hours as needed for anxiety.   fluticasone  (FLONASE ) 50 MCG/ACT nasal spray Place 2 sprays into both nostrils daily.   HYDROcodone -acetaminophen  (NORCO/VICODIN) 5-325 MG tablet Take 1  tablet by mouth every 6  (six) hours as needed for moderate pain (pain score 4-6).   hydrocortisone  (ANUSOL -HC) 2.5 % rectal cream Place 1 Application rectally 2 (two) times daily as needed for hemorrhoids or anal itching.   levocetirizine (XYZAL ) 5 MG tablet TAKE ONE TABLET EVERY EVENING   lisinopril  (ZESTRIL ) 10 MG tablet Take 1 tablet (10 mg total) by mouth daily.   naproxen  (NAPROSYN ) 500 MG tablet TAKE 1 TABLET ONCE DAILY WITH MEAL   promethazine  (PHENERGAN ) 12.5 MG tablet TAKE ONE TABLET EVERY 8 HOURS AS NEEDED FOR NAUSEA AND VOMITING   No facility-administered encounter medications on file as of 05/18/2024.    History: Past Medical History:  Diagnosis Date   Allergy    Arthritis    Gout    Hyperlipidemia    Hypertension    Personal history of colonic adenoma 09/28/2012   Past Surgical History:  Procedure Laterality Date   APPENDECTOMY  1988   CARPAL TUNNEL RELEASE     bilateral 2000 and 2001   COLONOSCOPY  09/23/2012   ELBOW SURGERY  2006   muscle repair   EXPLORATORY LAPAROTOMY  1996   after falling on scissors; repair of hole in intestines   INCISIONAL HERNIA REPAIR  1997   POLYPECTOMY     UMBILICAL HERNIA REPAIR  1968   Family History  Problem Relation Age of Onset   Kidney cancer Mother    Diabetes Mother    COPD Mother    Anuerysm Father    Diabetes Father    Heart disease Father    Diabetes Sister    Colon cancer Neg Hx    Colon polyps Neg Hx    Esophageal cancer Neg Hx    Rectal cancer Neg Hx    Stomach cancer Neg Hx    Social History   Occupational History   Occupation: disability/ partial  Tobacco Use   Smoking status: Never   Smokeless tobacco: Current    Types: Snuff  Vaping Use   Vaping status: Never Used  Substance and Sexual Activity   Alcohol use: No   Drug use: No   Sexual activity: Not on file   Tobacco Counseling Ready to quit: Not Answered Counseling given: Yes  SDOH Screenings   Food Insecurity: No Food Insecurity (05/18/2024)  Housing: Unknown  (05/18/2024)  Transportation Needs: No Transportation Needs (05/18/2024)  Utilities: Not At Risk (05/18/2024)  Alcohol Screen: Low Risk  (05/18/2023)  Depression (PHQ2-9): Low Risk  (05/18/2024)  Financial Resource Strain: Low Risk  (05/18/2023)  Physical Activity: Insufficiently Active (05/18/2024)  Social Connections: Socially Isolated (05/18/2024)  Stress: No Stress Concern Present (05/18/2024)  Tobacco Use: High Risk (05/18/2024)  Health Literacy: Adequate Health Literacy (05/18/2024)   See flowsheets for full screening details  Depression Screen PHQ 2 & 9 Depression Scale- Over the past 2 weeks, how often have you been bothered by any of the following problems? Little interest or pleasure in doing things: 0 Feeling down, depressed, or hopeless (PHQ Adolescent also includes...irritable): 0 PHQ-2 Total Score: 0     Goals Addressed   None          Objective:    Today's Vitals   05/18/24 0911  BP: 116/71  Pulse: 61  Weight: 220 lb (99.8 kg)  Height: 5' 8 (1.727 m)   Body mass index is 33.45 kg/m.  Hearing/Vision screen Hearing Screening - Comments:: Pt has ringing in ears Vision Screening - Comments:: Pt wear glasses/[employer] in [place]/last ov 21yrs ago,  sugg to get vision check to update Immunizations and Health Maintenance Health Maintenance  Topic Date Due   HIV Screening  Never done   COVID-19 Vaccine (5 - 2025-26 season) 02/15/2024   Colonoscopy  08/22/2024   Influenza Vaccine  09/13/2024 (Originally 01/15/2024)   DTaP/Tdap/Td (1 - Tdap) 04/19/2025 (Originally 07/06/1980)   Pneumococcal Vaccine: 50+ Years (1 of 1 - PCV) 04/19/2025 (Originally 07/07/2011)   Medicare Annual Wellness (AWV)  05/18/2025   Hepatitis C Screening  Completed   Zoster Vaccines- Shingrix  Completed   Hepatitis B Vaccines 19-59 Average Risk  Aged Out   HPV VACCINES  Aged Out   Meningococcal B Vaccine  Aged Out        Assessment/Plan:  This is a routine wellness examination for  Andre Hunt.  Patient Care Team: Gladis Mustard, FNP as PCP - General (Nurse Practitioner) Brenna Lin, MD (Inactive) as Attending Physician (Orthopedic Surgery) Avram Lupita BRAVO, MD as Consulting Physician (Gastroenterology)  I have personally reviewed and noted the following in the patient's chart:   Medical and social history Use of alcohol, tobacco or illicit drugs  Current medications and supplements including opioid prescriptions. Functional ability and status Nutritional status Physical activity Advanced directives List of other physicians Hospitalizations, surgeries, and ER visits in previous 12 months Vitals Screenings to include cognitive, depression, and falls Referrals and appointments  No orders of the defined types were placed in this encounter.  In addition, I have reviewed and discussed with patient certain preventive protocols, quality metrics, and best practice recommendations. A written personalized care plan for preventive services as well as general preventive health recommendations were provided to patient.   Ozie Ned, CMA   05/18/2024   Return in 1 year (on 05/18/2025).  After Visit Summary: (MyChart) Due to this being a telephonic visit, the after visit summary with patients personalized plan was offered to patient via MyChart   Nurse Notes: pt is aware and due the following: covid vaccine--pt declined, HIV screening, and colonoscopy--will ordered when it gets closer to due date 3/26

## 2024-05-25 ENCOUNTER — Encounter: Payer: Self-pay | Admitting: Orthopedic Surgery

## 2024-05-25 ENCOUNTER — Ambulatory Visit: Admitting: Orthopedic Surgery

## 2024-05-25 VITALS — BP 160/93 | HR 68 | Ht 68.0 in | Wt 220.0 lb

## 2024-05-25 DIAGNOSIS — M1711 Unilateral primary osteoarthritis, right knee: Secondary | ICD-10-CM

## 2024-05-25 DIAGNOSIS — G8929 Other chronic pain: Secondary | ICD-10-CM | POA: Diagnosis not present

## 2024-05-25 DIAGNOSIS — M25561 Pain in right knee: Secondary | ICD-10-CM

## 2024-05-25 NOTE — Patient Instructions (Signed)

## 2024-05-25 NOTE — Progress Notes (Signed)
 New Patient Visit  Summary: Andre Hunt is a 63 y.o. male with the following: 1. Chronic pain of right knee; right knee arthritis   Assessment & Plan Primary osteoarthritis of the right knee Chronic osteoarthritis with significant pain and functional limitation. Prefers non-surgical management. Discussed steroid and hyaluronic acid gel injections. - Administered steroid injection to the right knee. - Discussed future hyaluronic acid gel injections, pending insurance authorization and three-month wait post-steroid injection.  Procedure note injection Right knee joint   Verbal consent was obtained to inject the right knee joint  Timeout was completed to confirm the site of injection.  The skin was prepped with alcohol and ethyl chloride was sprayed at the injection site.  A 21-gauge needle was used to inject 40 mg of Depo-Medrol  and 1% lidocaine (4 cc) into the right knee using an anterolateral approach.  There were no complications. A sterile bandage was applied.    Follow-up: Return if symptoms worsen or fail to improve.  Subjective:  Chief Complaint  Patient presents with   Knee Pain    Right      Discussed the use of AI scribe software for clinical note transcription with the patient, who gave verbal consent to proceed.  History of Present Illness Andre Hunt is a 63 year old male with knee osteoarthritis who presents with worsening right knee pain.  He has severe right knee pain that limits walking, with radiation around the knee. Pain is worse with activity and by the end of the day he often has to stop moving. He notes flares in cold weather, with minimal symptoms in summer. He has not had injections or other treatments for over a year. He previously used prednisone  packs for flares, which relieved symptoms after about five days, but he prefers injections for faster relief. He denies recent injury. He has occasional swelling and a grinding sensation when fully  straightening the knee, especially at night.    Review of Systems: No fevers or chills No numbness or tingling No chest pain No shortness of breath No bowel or bladder dysfunction No GI distress No headaches   Medical History:  Past Medical History:  Diagnosis Date   Allergy    Arthritis    Gout    Hyperlipidemia    Hypertension    Personal history of colonic adenoma 09/28/2012    Past Surgical History:  Procedure Laterality Date   APPENDECTOMY  1988   CARPAL TUNNEL RELEASE     bilateral 2000 and 2001   COLONOSCOPY  09/23/2012   ELBOW SURGERY  2006   muscle repair   EXPLORATORY LAPAROTOMY  1996   after falling on scissors; repair of hole in intestines   INCISIONAL HERNIA REPAIR  1997   POLYPECTOMY     UMBILICAL HERNIA REPAIR  1968    Family History  Problem Relation Age of Onset   Kidney cancer Mother    Diabetes Mother    COPD Mother    Anuerysm Father    Diabetes Father    Heart disease Father    Diabetes Sister    Colon cancer Neg Hx    Colon polyps Neg Hx    Esophageal cancer Neg Hx    Rectal cancer Neg Hx    Stomach cancer Neg Hx    Social History   Tobacco Use   Smoking status: Never   Smokeless tobacco: Current    Types: Snuff  Vaping Use   Vaping status: Never Used  Substance Use  Topics   Alcohol use: No   Drug use: No    Allergies  Allergen Reactions   Iohexol Anaphylaxis    Allergy to IVP dye    Current Meds  Medication Sig   atorvastatin  (LIPITOR) 40 MG tablet TAKE ONE TABLET DAILY AT 6PM   cyclobenzaprine  (FLEXERIL ) 10 MG tablet Take 1 tablet (10 mg total) by mouth at bedtime. One tablet every night at bedtime as needed for spasm.   diazepam  (VALIUM ) 5 MG tablet Take 1 tablet (5 mg total) by mouth every 12 (twelve) hours as needed for anxiety.   fluticasone  (FLONASE ) 50 MCG/ACT nasal spray Place 2 sprays into both nostrils daily.   HYDROcodone -acetaminophen  (NORCO/VICODIN) 5-325 MG tablet Take 1 tablet by mouth every 6  (six) hours as needed for moderate pain (pain score 4-6).   hydrocortisone  (ANUSOL -HC) 2.5 % rectal cream Place 1 Application rectally 2 (two) times daily as needed for hemorrhoids or anal itching.   levocetirizine (XYZAL ) 5 MG tablet TAKE ONE TABLET EVERY EVENING   lisinopril  (ZESTRIL ) 10 MG tablet Take 1 tablet (10 mg total) by mouth daily.   naproxen  (NAPROSYN ) 500 MG tablet TAKE 1 TABLET ONCE DAILY WITH MEAL   promethazine  (PHENERGAN ) 12.5 MG tablet TAKE ONE TABLET EVERY 8 HOURS AS NEEDED FOR NAUSEA AND VOMITING    Objective: BP (!) 160/93   Pulse 68   Ht 5' 8 (1.727 m)   Wt 220 lb (99.8 kg)   BMI 33.45 kg/m   Physical Exam:    General: Alert and oriented. and No acute distress. Gait: Right sided antalgic gait.  Physical Exam MUSCULOSKELETAL: Right knee without significant swelling, non-tender on palpation. Crepitus present in right knee on movement. Right kneecap with limited mobility.  No increased laxity varus or valgus stress.  Negative Lachman.  No bruising.  No redness is appreciated.  Range of motion is 3-110 degrees.   IMAGING: No new imaging obtained today     New Medications:  No orders of the defined types were placed in this encounter.     Portions of this note were completed via Scientist, clinical (histocompatibility and immunogenetics).  Oneil DELENA Horde, MD  05/25/2024 1:57 PM

## 2024-05-31 ENCOUNTER — Encounter: Payer: Self-pay | Admitting: Nurse Practitioner

## 2024-05-31 ENCOUNTER — Ambulatory Visit (INDEPENDENT_AMBULATORY_CARE_PROVIDER_SITE_OTHER): Admitting: Nurse Practitioner

## 2024-05-31 ENCOUNTER — Ambulatory Visit: Payer: Self-pay

## 2024-05-31 VITALS — BP 120/79 | HR 81 | Temp 97.7°F | Ht 68.0 in | Wt 220.0 lb

## 2024-05-31 DIAGNOSIS — M25561 Pain in right knee: Secondary | ICD-10-CM

## 2024-05-31 NOTE — Progress Notes (Signed)
° °  Subjective:    Patient ID: Andre Hunt, male    DOB: [birth year], 62 y.o.   MRN: [id number]   Chief Complaint: knee pain   HPI  Patient went to see ortho about knee pain several weeks ago. Was set up for gel injection but found out ortho not in his network. So he went to see DR. Keeling and he gave him a steroid injection  last week. Still having knee pain. Pain is constant and is better in mornings and gets worse throughout the day. Needs referral to ortho that is in his network.  Patient Active Problem List   Diagnosis Date Noted   GAD (generalized anxiety disorder) 04/18/2019   BMI 34.0-34.9,adult 03/11/2016   Primary gout 12/13/2014   Essential hypertension 12/13/2014   Hyperlipidemia with target LDL less than 100 03/24/2014   History of colonic polyps 09/28/2012       Review of Systems  Constitutional:  Negative for diaphoresis.  Eyes:  Negative for pain.  Respiratory:  Negative for shortness of breath.   Cardiovascular:  Negative for chest pain, palpitations and leg swelling.  Gastrointestinal:  Negative for abdominal pain.  Endocrine: Negative for polydipsia.  Skin:  Negative for rash.  Neurological:  Negative for dizziness, weakness and headaches.  Hematological:  Does not bruise/bleed easily.  All other systems reviewed and are negative.      Objective:   Physical Exam Constitutional:      Appearance: Normal appearance.  Cardiovascular:     Rate and Rhythm: Normal rate and regular rhythm.     Heart sounds: Normal heart sounds.  Pulmonary:     Effort: Pulmonary effort is normal.     Breath sounds: Normal breath sounds.  Musculoskeletal:     Comments: FROM of right knee pain with pain on flexion and extension No knee effusion All ligaments intact  Skin:    General: Skin is warm.  Neurological:     General: No focal deficit present.     Mental Status: He is alert and oriented to person, place, and time.  Psychiatric:        Mood and Affect: Mood  normal.        Behavior: Behavior normal.    BP 120/79   Pulse 81   Temp 97.7 F (36.5 C) (Temporal)   Ht 5' 8 (1.727 m)   Wt 220 lb (99.8 kg)   SpO2 92%   BMI 33.45 kg/m         Assessment & Plan:   Andre Hunt in today with chief complaint of Knee Pain (Right knee. Needs referral for gel in knee)   1. Acute pain of right knee (Primary) Rest Ice bid Knee wrap when up walking Elevate when sitting RTO prn - Ambulatory referral to Orthopedic Surgery    The above assessment and management plan was discussed with the patient. The patient verbalized understanding of and has agreed to the management plan. Patient is aware to call the clinic if symptoms persist or worsen. Patient is aware when to return to the clinic for a follow-up visit. Patient educated on when it is appropriate to go to the emergency department.   Mary-Margaret Gladis, FNP

## 2024-05-31 NOTE — Patient Instructions (Signed)
 Chronic Knee Pain, Adult Knee pain that lasts longer than 3 months is called chronic knee pain. You may have pain in one or both knees. Symptoms of chronic knee pain may also include swelling and stiffness. Many conditions can cause chronic knee pain. The most common cause is wear and tear of your knee joint as you get older. Other possible causes include: A disease that causes inflammation of the knee, such as rheumatoid arthritis. This usually affects both knees. A condition called inflammatory arthritis, such as gout. An injury to the knee that causes arthritis. An injury to the knee that damages the ligaments. Ligaments are tissues that connect bones to each other. Runner's knee or pain behind the kneecap. Treatment for chronic knee pain depends on the cause. The main treatments for chronic knee pain are: Doing exercises to help your knee move better and get stronger, called physical therapy. Losing weight if you are overweight. This condition may also be treated with medicines, injections, a knee sleeve or brace, and by using crutches. You health care provider may also recommend rest, ice, pressure (compression), and elevation, also called RICE therapy. Follow these instructions at home: If you have a knee sleeve or brace that can be taken off:  Wear the knee sleeve or brace as told by your provider. Take it off only if your provider says that you can. Check the skin around it every day. Tell your provider if you see problems. Loosen the knee sleeve or brace if your toes tingle, are numb, or turn cold and blue. Keep the knee sleeve or brace clean and dry. Bathing If the knee sleeve or brace is not waterproof: Do not let it get wet. Cover it when you take a bath or shower. Use a cover that does not let any water in. Managing pain, stiffness, and swelling     If told, put heat on the area. Do this as often as told. Use the heat source that your provider recommends, such as a moist  heat pack or a heating pad. If you have a knee sleeve or brace that you can take off, remove it as told. Place a towel between your skin and the heat source. Leave the heat on for 20-30 minutes. If told, put ice on the area. If you have a knee sleeve or brace that you can take off, remove it as told. Put ice in a plastic bag. Place a towel between your skin and the bag. Leave the ice on for 20 minutes, 2-3 times a day. If your skin turns bright red, remove the ice or heat right away to prevent skin damage. The risk of damage is higher if you cannot feel pain, heat, or cold. Move your toes often to reduce stiffness and swelling. Raise the injured area above the level of your heart while you are sitting or lying down. Use a pillow to support your foot as needed. Activity Avoid activities where both feet leave the ground at the same time. Avoid running, jumping rope, and doing jumping jacks. Follow the exercise plan that your provider made for you. Your provider may suggest that you: Avoid activities that make knee pain worse. This may mean that you need to change your exercise routines, sports, or job duties. Wear shoes with cushioned soles. Avoid sports that require running and sudden changes in direction. Do physical therapy. Physical therapy helps your knee move better and get stronger. Exercise as told. Do exercises that increase balance and strength, such as  tai chi and yoga. Do not stand or walk on your injured knee until you're told it's okay. Use crutches as told. Return to normal activities when you're told. Ask what things are safe for you to do. General instructions Take your medicines only as told by your provider. If you are overweight, work with your provider and an expert in healthy eating called a dietitian to set goals to lose weight. Losing even a little weight can reduce knee pain. Being overweight can make your knee hurt more. Do not smoke, vape, or use products with  nicotine or tobacco in them. If you need help quitting, talk with your provider. Keep all follow-up visits. Your provider will monitor your pain and try other treatments if needed. Contact a health care provider if: You have knee pain that is not getting better or gets worse. You are not able to do your exercises due to knee pain. Get help right away if: Your knee swells and the swelling becomes worse. You cannot move your knee. You have severe knee pain. This information is not intended to replace advice given to you by your health care provider. Make sure you discuss any questions you have with your health care provider. Document Revised: 03/05/2023 Document Reviewed: 07/28/2022 Elsevier Patient Education  2024 [employer].

## 2024-05-31 NOTE — Telephone Encounter (Signed)
 Apt today. LS

## 2024-05-31 NOTE — Telephone Encounter (Signed)
 FYI Only or Action Required?: FYI only for provider: appointment scheduled on 05/31/24.  Patient was last seen in primary care on 04/19/2024 by Gladis Mustard, FNP.  Called Nurse Triage reporting Knee Pain.  Symptoms began several weeks ago.  Interventions attempted: OTC medications: Tylenol , Motrin, little to no improvement.  Symptoms are: gradually worsening.  Triage Disposition: See HCP Within 4 Hours (Or PCP Triage)  Patient/caregiver understands and will follow disposition?: Yes   Copied from CRM #[id number]. Topic: Clinical - Red Word Triage >> May 30, 2024  4:13 PM Susanna ORN wrote: Red Word that prompted transfer to Nurse Triage: Patient states he's having a lot of pain in his right knee and wants to schedule appt with Mary-Margaret Gladis at [facility]. States it's not swollen. Reason for Disposition  [1] SEVERE pain (e.g., excruciating, unable to walk) AND [2] not improved after 2 hours of pain medicine  Answer Assessment - Initial Assessment Questions 1. LOCATION and RADIATION: Where is the pain located?      Right knee 2. QUALITY: What does the pain feel like?  (e.g., sharp, dull, aching, burning)     Sharp 3. SEVERITY: How bad is the pain? What does it keep you from doing?   (Scale 1-10; or mild, moderate, severe)     10/10 4. ONSET: When did the pain start? Does it come and go, or is it there all the time?     Intermittent x 2 years, worsening recently 5. RECURRENT: Have you had this pain before? If Yes, ask: When, and what happened then?     Yes, worsening 6. SETTING: Has there been any recent work, exercise or other activity that involved that part of the body?      None 7. AGGRAVATING FACTORS: What makes the knee pain worse? (e.g., walking, climbing stairs, running)     Walking, standing 8. ASSOCIATED SYMPTOMS: Is there any swelling or redness of the knee?     None 9. OTHER SYMPTOMS: Do you have any other symptoms? (e.g., calf pain, chest  pain, difficulty breathing, fever)     Denies  Protocols used: Knee Pain-A-AH

## 2024-06-13 ENCOUNTER — Other Ambulatory Visit: Payer: Self-pay | Admitting: Nurse Practitioner

## 2024-06-24 ENCOUNTER — Other Ambulatory Visit: Payer: Self-pay | Admitting: *Deleted

## 2024-07-04 ENCOUNTER — Other Ambulatory Visit: Payer: Self-pay | Admitting: Nurse Practitioner

## 2024-07-04 DIAGNOSIS — J301 Allergic rhinitis due to pollen: Secondary | ICD-10-CM

## 2024-09-15 ENCOUNTER — Ambulatory Visit: Admitting: Nurse Practitioner

## 2025-05-19 ENCOUNTER — Ambulatory Visit
